# Patient Record
Sex: Female | Born: 1935 | Race: Black or African American | Hispanic: No | Smoking: Former smoker
Health system: Southern US, Community
[De-identification: ages and names within clinical notes are randomized; demographics above are authoritative.]

## PROBLEM LIST (undated history)

## (undated) ENCOUNTER — Emergency Department (HOSPITAL_COMMUNITY): Payer: Medicare Other | Source: Home / Self Care

## (undated) DIAGNOSIS — D696 Thrombocytopenia, unspecified: Secondary | ICD-10-CM

## (undated) DIAGNOSIS — E785 Hyperlipidemia, unspecified: Secondary | ICD-10-CM

## (undated) DIAGNOSIS — R51 Headache: Secondary | ICD-10-CM

## (undated) DIAGNOSIS — D638 Anemia in other chronic diseases classified elsewhere: Secondary | ICD-10-CM

## (undated) DIAGNOSIS — W102XXA Fall (on)(from) incline, initial encounter: Secondary | ICD-10-CM

## (undated) DIAGNOSIS — L8990 Pressure ulcer of unspecified site, unspecified stage: Secondary | ICD-10-CM

## (undated) DIAGNOSIS — M810 Age-related osteoporosis without current pathological fracture: Secondary | ICD-10-CM

## (undated) DIAGNOSIS — E46 Unspecified protein-calorie malnutrition: Secondary | ICD-10-CM

## (undated) DIAGNOSIS — I10 Essential (primary) hypertension: Secondary | ICD-10-CM

## (undated) DIAGNOSIS — E119 Type 2 diabetes mellitus without complications: Secondary | ICD-10-CM

## (undated) DIAGNOSIS — I639 Cerebral infarction, unspecified: Secondary | ICD-10-CM

## (undated) DIAGNOSIS — I7090 Unspecified atherosclerosis: Secondary | ICD-10-CM

## (undated) DIAGNOSIS — K219 Gastro-esophageal reflux disease without esophagitis: Secondary | ICD-10-CM

## (undated) DIAGNOSIS — R531 Weakness: Secondary | ICD-10-CM

## (undated) DIAGNOSIS — E870 Hyperosmolality and hypernatremia: Secondary | ICD-10-CM

## (undated) DIAGNOSIS — G309 Alzheimer's disease, unspecified: Secondary | ICD-10-CM

## (undated) DIAGNOSIS — M79601 Pain in right arm: Secondary | ICD-10-CM

## (undated) DIAGNOSIS — K5909 Other constipation: Secondary | ICD-10-CM

## (undated) DIAGNOSIS — N189 Chronic kidney disease, unspecified: Secondary | ICD-10-CM

## (undated) DIAGNOSIS — I739 Peripheral vascular disease, unspecified: Secondary | ICD-10-CM

## (undated) DIAGNOSIS — F0280 Dementia in other diseases classified elsewhere without behavioral disturbance: Secondary | ICD-10-CM

## (undated) DIAGNOSIS — N390 Urinary tract infection, site not specified: Secondary | ICD-10-CM

## (undated) DIAGNOSIS — I708 Atherosclerosis of other arteries: Secondary | ICD-10-CM

## (undated) DIAGNOSIS — Z9889 Other specified postprocedural states: Secondary | ICD-10-CM

## (undated) DIAGNOSIS — I2510 Atherosclerotic heart disease of native coronary artery without angina pectoris: Secondary | ICD-10-CM

## (undated) DIAGNOSIS — I441 Atrioventricular block, second degree: Secondary | ICD-10-CM

## (undated) DIAGNOSIS — I350 Nonrheumatic aortic (valve) stenosis: Secondary | ICD-10-CM

## (undated) DIAGNOSIS — L8994 Pressure ulcer of unspecified site, stage 4: Secondary | ICD-10-CM

## (undated) HISTORY — DX: Nonrheumatic aortic (valve) stenosis: I35.0

## (undated) HISTORY — DX: Unspecified atherosclerosis: I70.90

## (undated) HISTORY — DX: Dementia in other diseases classified elsewhere without behavioral disturbance: F02.80

## (undated) HISTORY — DX: Other constipation: K59.09

## (undated) HISTORY — DX: Urinary tract infection, site not specified: N39.0

## (undated) HISTORY — DX: Type 2 diabetes mellitus without complications: E11.9

## (undated) HISTORY — DX: Anemia in other chronic diseases classified elsewhere: D63.8

## (undated) HISTORY — DX: Headache: R51

## (undated) HISTORY — DX: Fall (on)(from) incline, initial encounter: W10.2XXA

## (undated) HISTORY — PX: OTHER SURGICAL HISTORY: SHX169

## (undated) HISTORY — DX: Alzheimer's disease, unspecified: G30.9

## (undated) HISTORY — DX: Essential (primary) hypertension: I10

## (undated) HISTORY — DX: Peripheral vascular disease, unspecified: I73.9

## (undated) HISTORY — DX: Hyperosmolality and hypernatremia: E87.0

## (undated) HISTORY — DX: Atherosclerosis of other arteries: I70.8

## (undated) HISTORY — DX: Hyperlipidemia, unspecified: E78.5

## (undated) HISTORY — DX: Atherosclerotic heart disease of native coronary artery without angina pectoris: I25.10

## (undated) HISTORY — DX: Weakness: R53.1

## (undated) HISTORY — DX: Cerebral infarction, unspecified: I63.9

## (undated) HISTORY — DX: Unspecified protein-calorie malnutrition: E46

## (undated) HISTORY — DX: Pressure ulcer of unspecified site, unspecified stage: L89.90

## (undated) HISTORY — DX: Pressure ulcer of unspecified site, stage 4: L89.94

## (undated) HISTORY — DX: Thrombocytopenia, unspecified: D69.6

## (undated) HISTORY — DX: Pain in right arm: M79.601

## (undated) HISTORY — DX: Age-related osteoporosis without current pathological fracture: M81.0

## (undated) HISTORY — DX: Other specified postprocedural states: Z98.890

## (undated) HISTORY — DX: Chronic kidney disease, unspecified: N18.9

## (undated) HISTORY — DX: Atrioventricular block, second degree: I44.1

## (undated) HISTORY — DX: Gastro-esophageal reflux disease without esophagitis: K21.9

---

## 2000-02-02 ENCOUNTER — Emergency Department (HOSPITAL_COMMUNITY): Admission: EM | Admit: 2000-02-02 | Discharge: 2000-02-02 | Payer: Self-pay

## 2000-08-21 ENCOUNTER — Emergency Department (HOSPITAL_COMMUNITY): Admission: EM | Admit: 2000-08-21 | Discharge: 2000-08-22 | Payer: Self-pay | Admitting: *Deleted

## 2000-09-01 ENCOUNTER — Encounter (HOSPITAL_BASED_OUTPATIENT_CLINIC_OR_DEPARTMENT_OTHER): Payer: Self-pay | Admitting: Internal Medicine

## 2000-09-01 ENCOUNTER — Ambulatory Visit (HOSPITAL_COMMUNITY): Admission: RE | Admit: 2000-09-01 | Discharge: 2000-09-01 | Payer: Self-pay | Admitting: Internal Medicine

## 2000-09-10 ENCOUNTER — Encounter: Admission: RE | Admit: 2000-09-10 | Discharge: 2000-09-10 | Payer: Self-pay | Admitting: Gastroenterology

## 2000-09-10 ENCOUNTER — Encounter: Payer: Self-pay | Admitting: Gastroenterology

## 2001-08-31 DIAGNOSIS — R519 Headache, unspecified: Secondary | ICD-10-CM

## 2001-08-31 HISTORY — DX: Headache, unspecified: R51.9

## 2001-10-15 ENCOUNTER — Encounter: Payer: Self-pay | Admitting: Emergency Medicine

## 2001-10-15 ENCOUNTER — Inpatient Hospital Stay (HOSPITAL_COMMUNITY): Admission: EM | Admit: 2001-10-15 | Discharge: 2001-10-20 | Payer: Self-pay | Admitting: Emergency Medicine

## 2001-11-02 ENCOUNTER — Encounter: Admission: RE | Admit: 2001-11-02 | Discharge: 2002-01-31 | Payer: Self-pay | Admitting: Internal Medicine

## 2003-03-13 ENCOUNTER — Ambulatory Visit (HOSPITAL_COMMUNITY): Admission: RE | Admit: 2003-03-13 | Discharge: 2003-03-13 | Payer: Self-pay | Admitting: Gastroenterology

## 2003-03-28 ENCOUNTER — Emergency Department (HOSPITAL_COMMUNITY): Admission: EM | Admit: 2003-03-28 | Discharge: 2003-03-28 | Payer: Self-pay | Admitting: Emergency Medicine

## 2003-04-05 ENCOUNTER — Encounter (INDEPENDENT_AMBULATORY_CARE_PROVIDER_SITE_OTHER): Payer: Self-pay | Admitting: Specialist

## 2003-04-05 ENCOUNTER — Ambulatory Visit (HOSPITAL_BASED_OUTPATIENT_CLINIC_OR_DEPARTMENT_OTHER): Admission: RE | Admit: 2003-04-05 | Discharge: 2003-04-05 | Payer: Self-pay | Admitting: General Surgery

## 2003-04-13 ENCOUNTER — Ambulatory Visit (HOSPITAL_COMMUNITY): Admission: RE | Admit: 2003-04-13 | Discharge: 2003-04-13 | Payer: Self-pay | Admitting: Internal Medicine

## 2003-04-13 ENCOUNTER — Encounter: Payer: Self-pay | Admitting: Internal Medicine

## 2004-04-03 ENCOUNTER — Inpatient Hospital Stay (HOSPITAL_BASED_OUTPATIENT_CLINIC_OR_DEPARTMENT_OTHER): Admission: RE | Admit: 2004-04-03 | Discharge: 2004-04-03 | Payer: Self-pay | Admitting: Cardiology

## 2004-04-08 ENCOUNTER — Ambulatory Visit (HOSPITAL_COMMUNITY): Admission: RE | Admit: 2004-04-08 | Discharge: 2004-04-09 | Payer: Self-pay | Admitting: Cardiology

## 2005-03-04 ENCOUNTER — Emergency Department (HOSPITAL_COMMUNITY): Admission: EM | Admit: 2005-03-04 | Discharge: 2005-03-04 | Payer: Self-pay | Admitting: Emergency Medicine

## 2005-04-20 ENCOUNTER — Ambulatory Visit: Payer: Self-pay | Admitting: Cardiology

## 2005-05-07 ENCOUNTER — Ambulatory Visit: Payer: Self-pay

## 2005-06-25 ENCOUNTER — Encounter: Admission: RE | Admit: 2005-06-25 | Discharge: 2005-06-25 | Payer: Self-pay | Admitting: Orthopedic Surgery

## 2005-08-11 ENCOUNTER — Ambulatory Visit: Payer: Self-pay | Admitting: Cardiology

## 2005-08-28 ENCOUNTER — Ambulatory Visit: Payer: Self-pay

## 2005-09-16 ENCOUNTER — Ambulatory Visit: Payer: Self-pay | Admitting: Physical Medicine & Rehabilitation

## 2005-09-16 ENCOUNTER — Ambulatory Visit: Payer: Self-pay | Admitting: Infectious Diseases

## 2005-09-16 ENCOUNTER — Inpatient Hospital Stay (HOSPITAL_COMMUNITY): Admission: RE | Admit: 2005-09-16 | Discharge: 2005-09-21 | Payer: Self-pay | Admitting: Orthopedic Surgery

## 2005-09-16 ENCOUNTER — Encounter (INDEPENDENT_AMBULATORY_CARE_PROVIDER_SITE_OTHER): Payer: Self-pay | Admitting: Specialist

## 2005-09-21 ENCOUNTER — Inpatient Hospital Stay
Admission: RE | Admit: 2005-09-21 | Discharge: 2005-10-03 | Payer: Self-pay | Admitting: Physical Medicine & Rehabilitation

## 2005-10-05 ENCOUNTER — Ambulatory Visit (HOSPITAL_COMMUNITY): Admission: RE | Admit: 2005-10-05 | Discharge: 2005-10-05 | Payer: Self-pay | Admitting: Orthopedic Surgery

## 2005-11-11 ENCOUNTER — Inpatient Hospital Stay (HOSPITAL_COMMUNITY): Admission: RE | Admit: 2005-11-11 | Discharge: 2005-11-16 | Payer: Self-pay | Admitting: Orthopedic Surgery

## 2005-11-11 ENCOUNTER — Encounter (INDEPENDENT_AMBULATORY_CARE_PROVIDER_SITE_OTHER): Payer: Self-pay | Admitting: *Deleted

## 2005-11-11 ENCOUNTER — Ambulatory Visit: Payer: Self-pay | Admitting: Physical Medicine & Rehabilitation

## 2006-01-29 ENCOUNTER — Ambulatory Visit: Payer: Self-pay | Admitting: Cardiology

## 2006-02-05 ENCOUNTER — Ambulatory Visit: Payer: Self-pay | Admitting: Cardiology

## 2006-08-06 ENCOUNTER — Ambulatory Visit: Payer: Self-pay | Admitting: Cardiology

## 2006-08-20 ENCOUNTER — Ambulatory Visit: Payer: Self-pay

## 2006-08-20 ENCOUNTER — Ambulatory Visit: Payer: Self-pay | Admitting: Cardiology

## 2006-08-20 ENCOUNTER — Encounter: Payer: Self-pay | Admitting: Cardiology

## 2007-01-31 ENCOUNTER — Ambulatory Visit: Payer: Self-pay | Admitting: Cardiology

## 2007-03-28 ENCOUNTER — Ambulatory Visit: Payer: Self-pay | Admitting: Cardiology

## 2007-03-28 LAB — CONVERTED CEMR LAB
ALT: 19 units/L (ref 0–35)
AST: 26 units/L (ref 0–37)
Albumin: 3.5 g/dL (ref 3.5–5.2)
Alkaline Phosphatase: 72 units/L (ref 39–117)
Bilirubin, Direct: 0.1 mg/dL (ref 0.0–0.3)
Cholesterol: 354 mg/dL (ref 0–200)
HDL: 50.9 mg/dL (ref >39.0)
Total Bilirubin: 1.3 mg/dL — ABNORMAL HIGH (ref 0.3–1.2)
Total CHOL/HDL Ratio: 7
Total Protein: 7.2 g/dL (ref 6.0–8.3)

## 2007-08-05 ENCOUNTER — Ambulatory Visit: Payer: Self-pay | Admitting: Cardiology

## 2007-08-05 LAB — CONVERTED CEMR LAB
ALT: 19 units/L (ref 0–35)
AST: 27 units/L (ref 0–37)
Albumin: 3.6 g/dL (ref 3.5–5.2)
Alkaline Phosphatase: 70 units/L (ref 39–117)
Bilirubin, Direct: 0.1 mg/dL (ref 0.0–0.3)
Total Protein: 7.3 g/dL (ref 6.0–8.3)
Triglycerides: 76 mg/dL (ref 0–149)

## 2007-08-15 ENCOUNTER — Ambulatory Visit: Payer: Self-pay | Admitting: Cardiology

## 2007-11-04 ENCOUNTER — Ambulatory Visit: Payer: Self-pay | Admitting: Cardiology

## 2007-11-04 LAB — CONVERTED CEMR LAB
ALT: 16 units/L (ref 0–35)
AST: 24 units/L (ref 0–37)
Direct LDL: 130 mg/dL
HDL: 65.7 mg/dL (ref >39.0)
Total Bilirubin: 0.8 mg/dL (ref 0.3–1.2)
Total Protein: 6.7 g/dL (ref 6.0–8.3)

## 2008-02-02 ENCOUNTER — Ambulatory Visit: Payer: Self-pay | Admitting: Cardiology

## 2008-02-13 ENCOUNTER — Ambulatory Visit: Payer: Self-pay

## 2008-02-13 ENCOUNTER — Encounter: Payer: Self-pay | Admitting: Cardiology

## 2008-07-31 ENCOUNTER — Ambulatory Visit: Payer: Self-pay | Admitting: Cardiovascular Disease

## 2008-08-09 ENCOUNTER — Ambulatory Visit: Payer: Self-pay

## 2009-02-11 ENCOUNTER — Ambulatory Visit: Payer: Self-pay | Admitting: Cardiovascular Disease

## 2009-02-11 DIAGNOSIS — I350 Nonrheumatic aortic (valve) stenosis: Secondary | ICD-10-CM

## 2009-02-11 DIAGNOSIS — I10 Essential (primary) hypertension: Secondary | ICD-10-CM

## 2009-02-11 DIAGNOSIS — I2510 Atherosclerotic heart disease of native coronary artery without angina pectoris: Secondary | ICD-10-CM | POA: Insufficient documentation

## 2009-02-20 ENCOUNTER — Ambulatory Visit: Payer: Self-pay

## 2009-02-20 ENCOUNTER — Encounter: Payer: Self-pay | Admitting: Cardiovascular Disease

## 2009-03-11 ENCOUNTER — Telehealth: Payer: Self-pay | Admitting: Cardiovascular Disease

## 2009-07-08 ENCOUNTER — Ambulatory Visit: Payer: Self-pay | Admitting: Cardiology

## 2009-07-08 ENCOUNTER — Inpatient Hospital Stay (HOSPITAL_COMMUNITY): Admission: EM | Admit: 2009-07-08 | Discharge: 2009-07-13 | Payer: Self-pay | Admitting: Emergency Medicine

## 2009-07-09 ENCOUNTER — Encounter: Payer: Self-pay | Admitting: Cardiovascular Disease

## 2009-07-30 ENCOUNTER — Encounter: Payer: Self-pay | Admitting: Nurse Practitioner

## 2009-07-30 ENCOUNTER — Encounter: Payer: Self-pay | Admitting: Cardiovascular Disease

## 2009-07-30 ENCOUNTER — Ambulatory Visit: Payer: Self-pay | Admitting: Cardiology

## 2009-07-30 DIAGNOSIS — I441 Atrioventricular block, second degree: Secondary | ICD-10-CM | POA: Insufficient documentation

## 2009-07-30 DIAGNOSIS — E119 Type 2 diabetes mellitus without complications: Secondary | ICD-10-CM

## 2009-07-30 DIAGNOSIS — S46819A Strain of other muscles, fascia and tendons at shoulder and upper arm level, unspecified arm, initial encounter: Secondary | ICD-10-CM

## 2009-07-30 DIAGNOSIS — S43499A Other sprain of unspecified shoulder joint, initial encounter: Secondary | ICD-10-CM

## 2009-07-30 DIAGNOSIS — I080 Rheumatic disorders of both mitral and aortic valves: Secondary | ICD-10-CM

## 2009-07-30 DIAGNOSIS — E7800 Pure hypercholesterolemia, unspecified: Secondary | ICD-10-CM | POA: Insufficient documentation

## 2009-07-31 ENCOUNTER — Encounter: Payer: Self-pay | Admitting: Internal Medicine

## 2009-07-31 ENCOUNTER — Ambulatory Visit (HOSPITAL_COMMUNITY): Admission: RE | Admit: 2009-07-31 | Discharge: 2009-07-31 | Payer: Self-pay | Admitting: Internal Medicine

## 2009-08-06 ENCOUNTER — Ambulatory Visit: Payer: Self-pay | Admitting: Cardiovascular Disease

## 2009-08-06 LAB — CONVERTED CEMR LAB
BUN: 18 mg/dL (ref 6–23)
CO2: 33 meq/L — ABNORMAL HIGH (ref 19–32)
Calcium: 10.6 mg/dL — ABNORMAL HIGH (ref 8.4–10.5)
GFR calc non Af Amer: 50.99 mL/min (ref >60)

## 2009-08-27 ENCOUNTER — Encounter (INDEPENDENT_AMBULATORY_CARE_PROVIDER_SITE_OTHER): Payer: Self-pay | Admitting: *Deleted

## 2009-09-12 LAB — CONVERTED CEMR LAB
CO2: 30 meq/L (ref 19–32)
Chloride: 110 meq/L (ref 96–112)
Creatinine, Ser: 1.2 mg/dL (ref 0.4–1.2)
Glucose, Bld: 107 mg/dL — ABNORMAL HIGH (ref 70–99)
Sodium: 145 meq/L (ref 135–145)

## 2009-09-16 ENCOUNTER — Ambulatory Visit: Payer: Self-pay | Admitting: Cardiovascular Disease

## 2009-09-17 LAB — CONVERTED CEMR LAB
BUN: 21 mg/dL (ref 6–23)
Chloride: 107 meq/L (ref 96–112)
Creatinine, Ser: 1.3 mg/dL — ABNORMAL HIGH (ref 0.4–1.2)
Glucose, Bld: 113 mg/dL — ABNORMAL HIGH (ref 70–99)
Potassium: 3.8 meq/L (ref 3.5–5.1)

## 2009-09-18 DIAGNOSIS — D649 Anemia, unspecified: Secondary | ICD-10-CM

## 2009-09-19 ENCOUNTER — Ambulatory Visit: Payer: Self-pay | Admitting: Gastroenterology

## 2009-09-19 ENCOUNTER — Encounter (INDEPENDENT_AMBULATORY_CARE_PROVIDER_SITE_OTHER): Payer: Self-pay | Admitting: *Deleted

## 2009-09-19 DIAGNOSIS — K5900 Constipation, unspecified: Secondary | ICD-10-CM | POA: Insufficient documentation

## 2009-09-19 DIAGNOSIS — R630 Anorexia: Secondary | ICD-10-CM

## 2009-09-19 DIAGNOSIS — M129 Arthropathy, unspecified: Secondary | ICD-10-CM | POA: Insufficient documentation

## 2009-09-19 DIAGNOSIS — R079 Chest pain, unspecified: Secondary | ICD-10-CM

## 2009-09-19 LAB — CONVERTED CEMR LAB
Basophils Relative: 0.6 % (ref 0.0–3.0)
Eosinophils Absolute: 0.1 10*3/uL (ref 0.0–0.7)
Eosinophils Relative: 1.4 % (ref 0.0–5.0)
HCT: 34 % — ABNORMAL LOW (ref 36.0–46.0)
Hemoglobin: 10.9 g/dL — ABNORMAL LOW (ref 12.0–15.0)
Lymphs Abs: 2.6 10*3/uL (ref 0.7–4.0)
Monocytes Relative: 7 % (ref 3.0–12.0)
Neutrophils Relative %: 37.7 % — ABNORMAL LOW (ref 43.0–77.0)
RBC: 4.21 M/uL (ref 3.87–5.11)
Saturation Ratios: 21.8 % (ref 20.0–50.0)
Transferrin: 202.9 mg/dL — ABNORMAL LOW (ref 212.0–360.0)

## 2009-09-25 ENCOUNTER — Telehealth: Payer: Self-pay | Admitting: Gastroenterology

## 2009-10-01 ENCOUNTER — Telehealth: Payer: Self-pay | Admitting: Gastroenterology

## 2009-10-02 ENCOUNTER — Ambulatory Visit: Payer: Self-pay | Admitting: Gastroenterology

## 2009-10-07 ENCOUNTER — Encounter: Payer: Self-pay | Admitting: Cardiovascular Disease

## 2009-10-07 DIAGNOSIS — I2510 Atherosclerotic heart disease of native coronary artery without angina pectoris: Secondary | ICD-10-CM | POA: Insufficient documentation

## 2009-11-11 ENCOUNTER — Ambulatory Visit: Payer: Self-pay | Admitting: Cardiovascular Disease

## 2009-12-09 ENCOUNTER — Telehealth: Payer: Self-pay | Admitting: Cardiovascular Disease

## 2009-12-12 ENCOUNTER — Encounter: Payer: Self-pay | Admitting: Cardiology

## 2009-12-12 ENCOUNTER — Telehealth: Payer: Self-pay | Admitting: Cardiovascular Disease

## 2009-12-12 ENCOUNTER — Ambulatory Visit: Payer: Self-pay | Admitting: Cardiovascular Disease

## 2009-12-12 ENCOUNTER — Inpatient Hospital Stay (HOSPITAL_COMMUNITY): Admission: EM | Admit: 2009-12-12 | Discharge: 2009-12-19 | Payer: Self-pay | Admitting: Emergency Medicine

## 2009-12-12 ENCOUNTER — Ambulatory Visit: Payer: Self-pay | Admitting: Cardiology

## 2009-12-12 ENCOUNTER — Ambulatory Visit: Payer: Self-pay

## 2009-12-13 LAB — CONVERTED CEMR LAB
BUN: 64 mg/dL — ABNORMAL HIGH (ref 6–23)
Basophils Relative: 0.3 % (ref 0.0–3.0)
CO2: 32 meq/L (ref 19–32)
Creatinine, Ser: 5.6 mg/dL (ref 0.4–1.2)
Eosinophils Absolute: 0 10*3/uL (ref 0.0–0.7)
Eosinophils Relative: 0.4 % (ref 0.0–5.0)
GFR calc non Af Amer: 9.44 mL/min (ref >60)
HCT: 33.4 % — ABNORMAL LOW (ref 36.0–46.0)
Hemoglobin: 11.1 g/dL — ABNORMAL LOW (ref 12.0–15.0)
Monocytes Absolute: 0.4 10*3/uL (ref 0.1–1.0)
Neutro Abs: 4.1 10*3/uL (ref 1.4–7.7)
Platelets: 215 10*3/uL (ref 150.0–400.0)
RBC: 4.37 M/uL (ref 3.87–5.11)
WBC: 6.3 10*3/uL (ref 4.5–10.5)

## 2009-12-24 ENCOUNTER — Encounter: Payer: Self-pay | Admitting: Cardiovascular Disease

## 2009-12-27 ENCOUNTER — Ambulatory Visit: Payer: Self-pay

## 2010-01-01 ENCOUNTER — Telehealth: Payer: Self-pay | Admitting: Cardiology

## 2010-01-01 ENCOUNTER — Telehealth: Payer: Self-pay | Admitting: Cardiovascular Disease

## 2010-01-06 ENCOUNTER — Ambulatory Visit: Payer: Self-pay | Admitting: Internal Medicine

## 2010-01-07 LAB — CONVERTED CEMR LAB
Basophils Relative: 0.9 % (ref 0.0–3.0)
CO2: 27 meq/L (ref 19–32)
Calcium: 10.3 mg/dL (ref 8.4–10.5)
Eosinophils Relative: 0.9 % (ref 0.0–5.0)
GFR calc non Af Amer: 39.51 mL/min (ref >60)
HCT: 25.4 % — ABNORMAL LOW (ref 36.0–46.0)
INR: 1.1 — ABNORMAL HIGH (ref 0.8–1.0)
Lymphocytes Relative: 42.5 % (ref 12.0–46.0)
Monocytes Absolute: 0.2 10*3/uL (ref 0.1–1.0)
Neutrophils Relative %: 50.8 % (ref 43.0–77.0)
Potassium: 3.6 meq/L (ref 3.5–5.1)
WBC: 3.8 10*3/uL — ABNORMAL LOW (ref 4.5–10.5)
aPTT: 29.8 s — ABNORMAL HIGH (ref 21.7–28.8)

## 2010-01-09 ENCOUNTER — Encounter: Payer: Self-pay | Admitting: Cardiovascular Disease

## 2010-01-10 ENCOUNTER — Ambulatory Visit: Payer: Self-pay | Admitting: Internal Medicine

## 2010-01-10 ENCOUNTER — Ambulatory Visit (HOSPITAL_COMMUNITY): Admission: RE | Admit: 2010-01-10 | Discharge: 2010-01-11 | Payer: Self-pay | Admitting: Internal Medicine

## 2010-01-10 HISTORY — PX: PACEMAKER INSERTION: SHX728

## 2010-01-13 ENCOUNTER — Encounter: Payer: Self-pay | Admitting: Internal Medicine

## 2010-01-20 ENCOUNTER — Ambulatory Visit: Payer: Self-pay

## 2010-01-20 ENCOUNTER — Encounter: Payer: Self-pay | Admitting: Internal Medicine

## 2010-02-27 ENCOUNTER — Telehealth: Payer: Self-pay | Admitting: Internal Medicine

## 2010-05-02 ENCOUNTER — Ambulatory Visit: Payer: Self-pay | Admitting: Internal Medicine

## 2010-05-13 ENCOUNTER — Ambulatory Visit: Payer: Self-pay | Admitting: Cardiovascular Disease

## 2010-06-19 ENCOUNTER — Encounter: Payer: Self-pay | Admitting: Cardiovascular Disease

## 2010-06-20 ENCOUNTER — Ambulatory Visit: Payer: Self-pay | Admitting: Cardiovascular Disease

## 2010-06-20 ENCOUNTER — Ambulatory Visit (HOSPITAL_COMMUNITY): Admission: RE | Admit: 2010-06-20 | Discharge: 2010-06-20 | Payer: Self-pay | Admitting: Cardiovascular Disease

## 2010-06-20 ENCOUNTER — Encounter: Payer: Self-pay | Admitting: Cardiovascular Disease

## 2010-08-18 ENCOUNTER — Encounter
Admission: RE | Admit: 2010-08-18 | Discharge: 2010-08-18 | Payer: Self-pay | Source: Home / Self Care | Attending: Nephrology | Admitting: Nephrology

## 2010-09-30 NOTE — Cardiovascular Report (Signed)
Summary: [facility]   [facility]   Imported By: Roderic Ovens 01/31/2010 15:57:39  _____________________________________________________________________  External Attachment:    Type:   Image     Comment:   External Document

## 2010-09-30 NOTE — Miscellaneous (Signed)
Summary: Orders Update  Clinical Lists Changes  Problems: Added new problem of CAROTID ARTERY DISEASE (ICD-433.10) Orders: Added new Test order of Carotid Duplex (Carotid Duplex) - Signed 

## 2010-09-30 NOTE — Procedures (Signed)
Summary: Colonoscopy  Patient: [name] Note: All result statuses are Final unless otherwise noted.  Tests: (1) Colonoscopy (COL)   COL Colonoscopy           DONE     [facility]     [address]     [place], [place]  [address]           COLONOSCOPY PROCEDURE REPORT           PATIENT:  [name], [name]  MR#:  [id number]     BIRTHDATE:  [birth year], 79 yrs. old  GENDER:  female           ENDOSCOPIST:  Vania Rea. Jarold Motto, MD, [facility]     Referred by:           PROCEDURE DATE:  10/02/2009     PROCEDURE:  Colonoscopy, Diagnostic     ASA CLASS:  Class III     INDICATIONS:  Iron Deficiency Anemia           MEDICATIONS:   Fentanyl 50 mcg IV, Versed 5 mg IV           DESCRIPTION OF PROCEDURE:   After the risks benefits and     alternatives of the procedure were thoroughly explained, informed     consent was obtained.  Digital rectal exam was performed and     revealed no abnormalities.   The LB PCF-H180AL [id number] endoscope     was introduced through the anus and advanced to the cecum, which     was identified by both the appendix and ileocecal valve, limited     by a redundant colon, poor preparation.    The quality of the prep     was adequate, using MoviPrep.  The instrument was then slowly     withdrawn as the colon was fully examined.     <<PROCEDUREIMAGES>>           FINDINGS:  Moderate diverticulosis was found sigmoid to descending     No polyps or cancers were seen.  This was otherwise a normal     examination of the colon.   Retroflexed views in the rectum     revealed no abnormalities.    The scope was then withdrawn from     the patient and the procedure completed.           COMPLICATIONS:  None           ENDOSCOPIC IMPRESSION:     1) Moderate diverticulosis in the sigmoid to descending     2) No polyps or cancers     3) Otherwise normal examination     RECOMMENDATIONS:     1) high fiber diet     2) Upper endoscopy will be scheduled           REPEAT  EXAM:  No           ______________________________     Vania Rea. Jarold Motto, MD, [facility]           CC:  Jarome Matin, MD           n.     Rosalie Doctor:   Vania Rea. Patterson at 10/02/2009 10:51 AM           [place], [place], [id number]  Note: An exclamation mark (!) indicates a result that was not dispersed into the flowsheet. Document Creation Date: 10/02/2009 10:52 AM _______________________________________________________________________  (1) Order result status: Final Collection or observation date-time: 10/02/2009 10:47  Requested date-time:  Receipt date-time:  Reported date-time:  Referring Physician:   Ordering Physician: Sheryn Bison [phone]) Specimen Source:  Source: Launa Grill Order Number: [id number] Lab site:

## 2010-09-30 NOTE — Miscellaneous (Signed)
Summary: Orders Update  Clinical Lists Changes  Orders: Added new Referral order of Event (Event) - Signed

## 2010-09-30 NOTE — Miscellaneous (Signed)
Summary: Orders Update  Clinical Lists Changes  Orders: Added new Test order of Carotid Duplex (Carotid Duplex) - Signed 

## 2010-09-30 NOTE — Progress Notes (Signed)
Summary: Question about Prep for Procedure  Phone Note Call from Patient Call back at Home Phone [phone]   Caller: Patient Call For: Dr. Jarold Motto Reason for Call: Talk to Nurse Summary of Call: Pt. has some questions about her prep for her procedure tomorrow Initial call taken by: Karna Christmas,  October 01, 2009 10:26 AM  Follow-up for Phone Call        Went over movi prep instructions agian with pt.  States she understands instructions. Follow-up by: Ashok Cordia RN,  October 01, 2009 10:31 AM

## 2010-09-30 NOTE — Procedures (Signed)
Summary: Upper Endoscopy  Patient: [name] Note: All result statuses are Final unless otherwise noted.  Tests: (1) Upper Endoscopy (EGD)   EGD Upper Endoscopy       DONE     [facility]     [address]     [place], [place]  [address]           ENDOSCOPY PROCEDURE REPORT           PATIENT:  [name], [name]  MR#:  [id number]     BIRTHDATE:  12/19/1929, 79 yrs. old  GENDER:  female           ENDOSCOPIST:  Vania Rea. Jarold Motto, MD, [facility]     Referred by:           PROCEDURE DATE:  10/02/2009     PROCEDURE:  EGD, diagnostic     ASA CLASS:  Class III     INDICATIONS:  iron deficiency anemia           MEDICATIONS:   There was residual sedation effect present from     prior procedure., Versed 2 mg IV, Fentanyl 25 mg     TOPICAL ANESTHETIC:           DESCRIPTION OF PROCEDURE:   After the risks benefits and     alternatives of the procedure were thoroughly explained, informed     consent was obtained.  The LB GIF-H180 [id number] endoscope was     introduced through the mouth and advanced to the second portion of     the duodenum, limited by retching and gagging.   The instrument     was slowly withdrawn as the mucosa was fully examined.     <<PROCEDUREIMAGES>>           Normal duodenal folds were noted.  The esophagus and     gastroesophageal junction were completely normal in appearance.     The stomach was entered and closely examined. The antrum,     angularis, and lesser curvature were well visualized, including a     retroflexed view of the cardia and fundus. The stomach wall was     normally distensable. The scope passed easily through the pylorus     into the duodenum.    Retroflexed views revealed no masses.    The     scope was then withdrawn from the patient and the procedure     completed.           COMPLICATIONS:  None           ENDOSCOPIC IMPRESSION:     1) Normal duodenal folds     2) Normal esophagus     3) Normal stomach     4) No masses     NO  CAUSE FOR ANEMIA.     RECOMMENDATIONS:     1) continue current medications     RESUME PLAVIX RX.           REPEAT EXAM:  No           ______________________________     Vania Rea. Jarold Motto, MD, [facility]           CC:  Jarome Matin, MD           n.     Rosalie Doctor:   Vania Rea. Patterson at 10/02/2009 11:00 AM           [place], Gotwalt [place], [id number]  Note: An exclamation mark (!) indicates a result that  was not dispersed into the flowsheet. Document Creation Date: 10/02/2009 11:01 AM _______________________________________________________________________  (1) Order result status: Final Collection or observation date-time: 10/02/2009 10:55 Requested date-time:  Receipt date-time:  Reported date-time:  Referring Physician:   Ordering Physician: Sheryn Bison [phone]) Specimen Source:  Source: Launa Grill Order Number: [id number] Lab site:

## 2010-09-30 NOTE — Progress Notes (Signed)
Summary: Life watch monitor  Phone Note Outgoing Call   Call placed to: Patient Summary of Call: Received several strips from Life watch showing second degree heart block.  Strips reviewed by Dr.McAlhany. I called pt this AM and she reports she is feeling good. No syncopal episodes. I told pt to call us if she begins having any symptoms. She will continue to wear monitor and has appt. scheduled with Dr. Clifton James on Jan 21, 2010. Initial call taken by: Dossie Arbour, RN, BSN,  Jan 01, 2010 10:10 AM

## 2010-09-30 NOTE — Assessment & Plan Note (Signed)
Summary: rov/ appt is 9:30/ gd   Visit Type:  Follow-up Referring Provider:  Jarome Matin, MD Primary Provider:  Jarome Matin, MD  CC:  no cardiac complaints today.  History of Present Illness: [name] is a pleasant 77 year old African American female with a past medical history significant for coronary artery disease, status post placement of stents in the circumflex and right coronary arteries in 2005 by Dr. Juanda Chance, as well as hypertension, mild AS, hyperlipidemia, obesity, and diabetes mellitus, who comes in today for routine cardiac followup. She has been doing well. Most recent carotid dopplers showed mild disease bilaterally (0-39% RICA, LICA). She was admitted to [facility] in November with pre-renal azotemia with hypotension, bradycardia and syncope and had an echo with normal LV function and no wall motion abnormalities. Her AS has been mild.  She was seen here in our office for hospital follow up in November by Ward Givens, NP. She has been doing well. She has had no chest pain, SOB, palpitatoins, near syncope or syncope. She has been taking all of her medications without problems.   Current Medications (verified): 1)  Nitroglycerin 0.4 Mg Subl (Nitroglycerin) .... Place 1 Tablet Under Tongue As Directed 2)  Plavix 75 Mg Tabs (Clopidogrel Bisulfate) .... Take 1 Tablet By Mouth Once A Day 3)  Metformin Hcl 500 Mg Xr24h-Tab (Metformin Hcl) .Marland Kitchen.. 1 Tab At Bedtime 4)  Amlodipine Besylate 5 Mg Tabs (Amlodipine Besylate) .... Take One Tablet By Mouth Daily 5)  Aspirin 81 Mg Tbec (Aspirin) .... Take One Tablet By Mouth Daily 6)  Crestor 40 Mg Tabs (Rosuvastatin Calcium) .... Take One Tablet By Mouth Daily. 7)  Potassium Chloride Crys Cr 20 Meq Cr-Tabs (Potassium Chloride Crys Cr) .... Take Two Tablets By Mouth Daily 8)  Atenolol 50 Mg Tabs (Atenolol) .... One Tablet By Mouth Two Times A Day 9)  Hydrochlorothiazide 25 Mg Tabs (Hydrochlorothiazide) .... One Tablet By Mouth  Once Daily  Allergies: 1)  ! * Latex  Past History:  Past Medical History: Reviewed history from 07/30/2009 and no changes required. Diabetes Mellitus Hyperlipidemia Hypertension CAD-s/p NSTEMI 2003 with stent placement OM1, PTCA with cutting ballloon proximal LAD. 2005 admission with chest pain, Bare metal stent placed in Circumflex and Taxus stent placed in RCA. Mild Aortic stenosis Mild MR Grade I Diastolic Relaxation Abnormality  Social History: Reviewed history from 07/31/2008 and no changes required. Remote tobacco use-smoked 1/2ppd for 30 years but stopped smoking 20 years ago. No alcohol No illicit drug use Widowed 8 children  Review of Systems  The patient denies fatigue, malaise, fever, weight gain/loss, vision loss, decreased hearing, hoarseness, chest pain, palpitations, shortness of breath, prolonged cough, wheezing, sleep apnea, coughing up blood, abdominal pain, blood in stool, nausea, vomiting, diarrhea, heartburn, incontinence, blood in urine, muscle weakness, joint pain, leg swelling, rash, skin lesions, headache, fainting, dizziness, depression, anxiety, enlarged lymph nodes, easy bruising or bleeding, and environmental allergies.    Vital Signs:  Patient profile:   77 year old female Height:      68 inches Weight:      213 pounds BMI:     32.50 Pulse rate:   64 / minute Pulse rhythm:   regular BP sitting:   130 / 70  (left arm) Cuff size:   large  Vitals Entered By: Danielle Rankin, CMA (November 11, 2009 9:25 AM)  Physical Exam  General:  General: Well developed, well nourished, NAD HEENT: OP clear, mucus membranes moist Psychiatric: Mood and affect normal  Neck: No JVD, no carotid bruits, no thyromegaly, no lymphadenopathy. Lungs:Clear bilaterally, no wheezes, rhonci, crackles CV: RRR with I/VI systolic  murmur at the RUSB. No  gallops rubs Abdomen: soft, NT, ND, BS present Extremities: No edema, pulses 2+.    Impression &  Recommendations:  Problem # 1:  CAD, NATIVE VESSEL (ICD-414.01) Stable. No chest pain. Continue current therapy.   Her updated medication list for this problem includes:    Nitroglycerin 0.4 Mg Subl (Nitroglycerin) .Marland Kitchen... Place 1 tablet under tongue as directed    Plavix 75 Mg Tabs (Clopidogrel bisulfate) .Marland Kitchen... Take 1 tablet by mouth once a day    Amlodipine Besylate 5 Mg Tabs (Amlodipine besylate) .Marland Kitchen... Take one tablet by mouth daily    Aspirin 81 Mg Tbec (Aspirin) .Marland Kitchen... Take one tablet by mouth daily    Atenolol 50 Mg Tabs (Atenolol) ..... One tablet by mouth two times a day  Problem # 2:  AORTIC STENOSIS (ICD-424.1) Asymptomatic. Mild by echo 11/10. Will repeat echo in 6 months.   Her updated medication list for this problem includes:    Nitroglycerin 0.4 Mg Subl (Nitroglycerin) .Marland Kitchen... Place 1 tablet under tongue as directed    Atenolol 50 Mg Tabs (Atenolol) ..... One tablet by mouth two times a day    Hydrochlorothiazide 25 Mg Tabs (Hydrochlorothiazide) ..... One tablet by mouth once daily  Problem # 3:  ESSENTIAL HYPERTENSION, BENIGN (ICD-401.1) Well controlled on current therapy.   Her updated medication list for this problem includes:    Amlodipine Besylate 5 Mg Tabs (Amlodipine besylate) .Marland Kitchen... Take one tablet by mouth daily    Aspirin 81 Mg Tbec (Aspirin) .Marland Kitchen... Take one tablet by mouth daily    Atenolol 50 Mg Tabs (Atenolol) ..... One tablet by mouth two times a day    Hydrochlorothiazide 25 Mg Tabs (Hydrochlorothiazide) ..... One tablet by mouth once daily  Problem # 4:  CAROTID ARTERY DISEASE (ICD-433.10) Repeat dopplers 6 months.   Her updated medication list for this problem includes:    Plavix 75 Mg Tabs (Clopidogrel bisulfate) .Marland Kitchen... Take 1 tablet by mouth once a day    Aspirin 81 Mg Tbec (Aspirin) .Marland Kitchen... Take one tablet by mouth daily  Patient Instructions: 1)  Your physician recommends that you schedule a follow-up appointment in: 6 months 2)  Your physician  recommends that you continue on your current medications as directed. Please refer to the Current Medication list given to you today. 3)  Your physician has requested that you have a carotid duplex. This test is an ultrasound of the carotid arteries in your neck. It looks at blood flow through these arteries that supply the brain with blood. Allow one hour for this exam. There are no restrictions or special instructions.  To be done in 6 months 4)  Your physician has requested that you have an echocardiogram.  Echocardiography is a painless test that uses sound waves to create images of your heart. It provides your doctor with information about the size and shape of your heart and how well your heart's chambers and valves are working.  This procedure takes approximately one hour. There are no restrictions for this procedure. To be done in 6 months

## 2010-09-30 NOTE — Miscellaneous (Signed)
Summary: Device preload  Clinical Lists Changes  Observations: Added new observation of PPM INDICATN: Syncope with brady (01/13/2010 11:54) Added new observation of MAGNET RTE: BOL 100 ERI  85 (01/13/2010 11:54) Added new observation of PPMLEADSTAT2: active (01/13/2010 11:54) Added new observation of PPMLEADSER2: [id number] (01/13/2010 11:54) Added new observation of PPMLEADMOD2: 1948  (01/13/2010 11:54) Added new observation of PPMLEADLOC2: RV  (01/13/2010 11:54) Added new observation of PPMLEADSTAT1: active  (01/13/2010 11:54) Added new observation of PPMLEADSER1: [id number]  (01/13/2010 11:54) Added new observation of PPMLEADMOD1: 1888TC  (01/13/2010 11:54) Added new observation of PPMLEADLOC1: RA  (01/13/2010 11:54) Added new observation of PPM IMP MD: Hillis Range, MD  (01/13/2010 11:54) Added new observation of PPMLEADDOI2: 01/10/2010  (01/13/2010 11:54) Added new observation of PPMLEADDOI1: 01/10/2010  (01/13/2010 11:54) Added new observation of PPM DOI: 01/10/2010  (01/13/2010 11:54) Added new observation of PPM SERL#: [id number]  (01/13/2010 11:54) Added new observation of PPM MODL#: [id number]  (01/13/2010 29:51) Added new observation of PACEMAKERMFG: [employer] Jude  (01/13/2010 11:54) Added new observation of PACEMAKER MD: Hillis Range, MD  (01/13/2010 11:54)      PPM Specifications Following MD:  Hillis Range, MD     PPM Vendor:  [employer] Jude     PPM Model Number:  [id number]     PPM Serial Number:  [id number] PPM DOI:  01/10/2010     PPM Implanting MD:  Hillis Range, MD  Lead 1    Location: RA     DOI: 01/10/2010     Model #: [id number]     Serial #: [id number]     Status: active Lead 2    Location: RV     DOI: 01/10/2010     Model #: 1948     Serial #: [id number]     Status: active  Magnet Response Rate:  BOL 100 ERI  85  Indications:  Syncope with brady

## 2010-09-30 NOTE — Letter (Signed)
Summary: [facility] [facility] Instructions  [facility]  [address] [place], [place] [address]   Phone: [phone]  Fax: [phone]       [name]    1930-06-10    MRN: [id number]        Procedure Day Dorna Bloom: Wednesday, 10/02/09     Arrival Time: 9:30      Procedure Time: 10:30     Location of Procedure:                    _X _  [facility] (4th Floor)   PREPARATION FOR COLONOSCOPY WITH MOVIPREP   Starting 5 days prior to your procedure 09/27/09 do not eat nuts, seeds, popcorn, corn, beans, peas,  salads, or any raw vegetables.  Do not take any fiber supplements (e.g. Metamucil, Citrucel, and Benefiber).  THE DAY BEFORE YOUR PROCEDURE         DATE: 10/01/09  FUX:NATFTDD  1.  Drink clear liquids the entire day-NO SOLID FOOD  2.  Do not drink anything colored red or purple.  Avoid juices with pulp.  No orange juice.  3.  Drink at least 64 oz. (8 glasses) of fluid/clear liquids during the day to prevent dehydration and help the prep work efficiently.  CLEAR LIQUIDS INCLUDE: Water Jello Ice Popsicles Tea (sugar ok, no milk/cream) Powdered fruit flavored drinks Coffee (sugar ok, no milk/cream) Gatorade Juice: apple, white grape, white cranberry  Lemonade Clear bullion, consomm, broth Carbonated beverages (any kind) Strained chicken noodle soup Hard Candy                             4.  In the morning, mix first dose of MoviPrep solution:    Empty 1 Pouch A and 1 Pouch B into the disposable container    Add lukewarm drinking water to the top line of the container. Mix to dissolve    Refrigerate (mixed solution should be used within 24 hrs)  5.  Begin drinking the prep at 5:00 p.m. The MoviPrep container is divided by 4 marks.   Every 15 minutes drink the solution down to the next mark (approximately 8 oz) until the full liter is complete.   6.  Follow completed prep with 16 oz of clear liquid of your choice (Nothing red or purple).  Continue  to drink clear liquids until bedtime.  7.  Before going to bed, mix second dose of MoviPrep solution:    Empty 1 Pouch A and 1 Pouch B into the disposable container    Add lukewarm drinking water to the top line of the container. Mix to dissolve    Refrigerate  THE DAY OF YOUR PROCEDURE      DATE: 10/02/09  DAY: _Wednesday  Beginning at 5:30 a.m. (5 hours before procedure):         1. Every 15 minutes, drink the solution down to the next mark (approx 8 oz) until the full liter is complete.  2. Follow completed prep with 16 oz. of clear liquid of your choice.    3. You may drink clear liquids until 8:30  (2 HOURS BEFORE PROCEDURE).   MEDICATION INSTRUCTIONS  Unless otherwise instructed, you should take regular prescription medications with a small sip of water   as early as possible the morning of your procedure.  Diabetic patients - see separate instructions.  Stop taking Plavix  on  09/27/09 (Friday)  (5 days before  procedure).               OTHER INSTRUCTIONS  You will need a responsible adult at least 75 years of age to accompany you and drive you home.   This person must remain in the waiting room during your procedure.  Wear loose fitting clothing that is easily removed.  Leave jewelry and other valuables at home.  However, you may wish to bring a book to read or  an iPod/MP3 player to listen to music as you wait for your procedure to start.  Remove all body piercing jewelry and leave at home.  Total time from sign-in until discharge is approximately 2-3 hours.  You should go home directly after your procedure and rest.  You can resume normal activities the  day after your procedure.  The day of your procedure you should not:   Drive   Make legal decisions   Operate machinery   Drink alcohol   Return to work  You will receive specific instructions about eating, activities and medications before you leave.    The above instructions have been  reviewed and explained to me by   _______________________    I fully understand and can verbalize these instructions _____________________________ Date _________

## 2010-09-30 NOTE — Assessment & Plan Note (Signed)
Summary: pc2/ gd   Visit Type:  Follow-up Referring Provider:  Dr Clifton James Primary Provider:  Jarome Matin, MD  CC:  no complaints.  History of Present Illness: The patient presents today for routine electrophysiology followup. She reports doing very well since last being seen in our clinic.  Her exercise tolerance has significantly improved with PPM.  The patient denies symptoms of palpitations, chest pain, shortness of breath, orthopnea, PND, lower extremity edema, dizziness, presyncope, syncope, or neurologic sequela. The patient is tolerating medications without difficulties and is otherwise without complaint today.   Current Medications (verified): 1)  Nitroglycerin 0.4 Mg Subl (Nitroglycerin) .... Place 1 Tablet Under Tongue As Directed 2)  Plavix 75 Mg Tabs (Clopidogrel Bisulfate) .... Take 1 Tablet By Mouth Once A Day 3)  Aspirin 81 Mg Tbec (Aspirin) .... Take One Tablet By Mouth Daily 4)  Hydrochlorothiazide 25 Mg Tabs (Hydrochlorothiazide) .Marland Kitchen.. 1 Tab Once Daily  Allergies: 1)  ! * Latex  Past History:  Past Medical History: Diabetes Mellitus Hyperlipidemia Hypertension CAD-s/p NSTEMI 2003 with stent placement OM1, PTCA with cutting ballloon proximal LAD. 2005 admission with chest pain, Bare metal stent placed in Circumflex and Taxus stent placed in RCA. Mild Aortic stenosis by echo 11/10 Mild MR Grade I Diastolic Relaxation Abnormality Anemia Recurrent syncope due to symptomatic bradycardia-->s/p PPM CRI remote CVA (recovered)  Past Surgical History: Hysterectomy Benign vocal cord tumor removal R knee surgery x 3 with TKA s/p SJM PPM 5/11  Vital Signs:  Patient profile:   77 year old female Height:      68 inches Weight:      224 pounds Pulse rate:   73 / minute BP sitting:   145 / 93  (left arm)  Vitals Entered By: Jacquelin Hawking, CMA (May 02, 2010 9:13 AM)  Physical Exam  General:  elderly, NAD Head:  normocephalic and atraumatic Eyes:   PERRLA/EOM intact; conjunctiva and lids normal. Mouth:  Teeth, gums and palate normal. Oral mucosa normal. Neck:  Neck supple, no JVD. No masses, thyromegaly or abnormal cervical nodes. Chest Wall:  pacemaker site is well healed Lungs:  Clear bilaterally to auscultation and percussion. Heart:  RRR, 2/6 SEM LUSB (mid peaking) Abdomen:  Bowel sounds positive; abdomen soft and non-tender without masses, organomegaly, or hernias noted. No hepatosplenomegaly. Msk:  Back normal, normal gait. Muscle strength and tone normal. Pulses:  pulses normal in all 4 extremities Extremities:  1+ BLE edema Neurologic:  Alert and oriented x 3.   PPM Specifications Following MD:  Hillis Range, MD     PPM Vendor:  [employer]     PPM Model Number:  [id number]     PPM Serial Number:  [id number] PPM DOI:  01/10/2010     PPM Implanting MD:  Hillis Range, MD  Lead 1    Location: [facility]     DOI: 01/10/2010     Model #: [id number]     Serial #: [id number]     Status: active Lead 2    Location: [facility]     DOI: 01/10/2010     Model #: [id number]     Serial #: [id number]     Status: active  Magnet Response Rate:  BOL 100 ERI  85  Indications:  Syncope with brady   PPM Follow Up Remote Check?  No Battery Voltage:  2.95 V     Battery Est. Longevity:  8.2 years     Pacer Dependent:  No  PPM Device Measurements Atrium  Amplitude: 4.5 mV, Impedance: 410 ohms, Threshold: 0.75 V at 0.4 msec Right Ventricle  Amplitude: 12 mV, Impedance: 600 ohms, Threshold: 0.625 V at 0.4 msec  Episodes MS Episodes:  6516     Percent Mode Switch:  <1%     Coumadin:  No Atrial Pacing:  18%     Ventricular Pacing:  89%  Parameters Mode:  DDD     Lower Rate Limit:  60     Upper Rate Limit:  110 Paced AV Delay:  200     Sensed AV Delay:  180 Next Cardiology Appt Due:  12/30/2010 Tech Comments:  Atrial and ventricular auto capture on.  Output reprogrammed for chronic thresholds.  Mode switch episodes ar T-wave oversensing, PVAB  reprogrammed .  ROV 5/12  with Dr. Johney Frame. Altha Harm, LPN  May 02, 2010 9:27 AM  MD Comments:  agree  Impression & Recommendations:  Problem # 1:  SYNCOPE AND COLLAPSE (ICD-780.2)  s/p PPM for symptmatic bradycardia doing well normal pacemaker function changes as above  Her updated medication list for this problem includes:    Nitroglycerin 0.4 Mg Subl (Nitroglycerin) .Marland Kitchen... Place 1 tablet under tongue as directed    Plavix 75 Mg Tabs (Clopidogrel bisulfate) .Marland Kitchen... Take 1 tablet by mouth once a day    Aspirin 81 Mg Tbec (Aspirin) .Marland Kitchen... Take one tablet by mouth daily  Problem # 2:  ESSENTIAL HYPERTENSION, BENIGN (ICD-401.1)  stable salt restriction  Her updated medication list for this problem includes:    Aspirin 81 Mg Tbec (Aspirin) .Marland Kitchen... Take one tablet by mouth daily    Hydrochlorothiazide 25 Mg Tabs (Hydrochlorothiazide) .Marland Kitchen... 1 tab once daily  Patient Instructions: 1)  Your physician recommends that you schedule a follow-up appointment in:   May 2012

## 2010-09-30 NOTE — Assessment & Plan Note (Signed)
Summary: ANEMIA--CH.   History of Present Illness Visit Type: consult  Primary GI MD: Sheryn Bison MD FACP FAGA Primary Provider: Jarome Matin, MD Requesting Provider: Jarome Matin, MD Chief Complaint: Anemia  History of Present Illness:   This patient is a very pleasant 77 year old African American female who has multiple cardiac stents associated with severe coronary artery disease who is on Plavix and aspirin. She was recently hospitalized because of syncope he was found to have iron deficiency anemia of unexplained etiology. She don't oral iron replacement therapy and is currently asymptomatic and denies any gastrointestinal, cardiovascular or pulmonary complaints. She does suffer from hypertension and type 2 diabetes both of which are well controlled on medications per Dr. Jarome Matin.previous GI work up 20 years ago showed a normal endoscopy and flexible sigmoidoscopy.  The patient does have a positive family history of stomach cancer in her sister but no known family history of colon cancer. She has rather marked constipation is on daily MiraLax. She denies melena or hematochezia, anorexia or weight loss or any specific hepatobiliary complaints. She is on aspirin but denies NSAID use. She does not smoke or abuse ethanol.She Has Had Previous Total Abdominal Hysterectomy and also removal of ovaries bilaterally.   GI Review of Systems      Denies abdominal pain, acid reflux, belching, bloating, chest pain, dysphagia with liquids, dysphagia with solids, heartburn, loss of appetite, nausea, vomiting, vomiting blood, weight loss, and  weight gain.        Denies anal fissure, black tarry stools, change in bowel habit, constipation, diarrhea, diverticulosis, fecal incontinence, heme positive stool, hemorrhoids, irritable bowel syndrome, jaundice, light color stool, liver problems, rectal bleeding, and  rectal pain.    Current Medications (verified): 1)  Nitroglycerin 0.4 Mg Subl  (Nitroglycerin) .... Place 1 Tablet Under Tongue As Directed 2)  Plavix 75 Mg Tabs (Clopidogrel Bisulfate) .... Take 1 Tablet By Mouth Once A Day 3)  Metformin Hcl 500 Mg Xr24h-Tab (Metformin Hcl) .Marland Kitchen.. 1 Tab At Bedtime 4)  Amlodipine Besylate 5 Mg Tabs (Amlodipine Besylate) .... Take One Tablet By Mouth Daily 5)  Aspirin 81 Mg Tbec (Aspirin) .... Take One Tablet By Mouth Daily 6)  Crestor 40 Mg Tabs (Rosuvastatin Calcium) .... Take One Tablet By Mouth Daily. 7)  Potassium Chloride Crys Cr 20 Meq Cr-Tabs (Potassium Chloride Crys Cr) .... Take Two Tablets By Mouth Daily 8)  Atenolol 50 Mg Tabs (Atenolol) .... One Tablet By Mouth Two Times A Day 9)  Hydrochlorothiazide 25 Mg Tabs (Hydrochlorothiazide) .... One Tablet By Mouth Once Daily  Allergies (verified): 1)  ! * Latex  Past History:  Past medical, surgical, family and social histories (including risk factors) reviewed for relevance to current acute and chronic problems.  Past Medical History: Reviewed history from 07/30/2009 and no changes required. Diabetes Mellitus Hyperlipidemia Hypertension CAD-s/p NSTEMI 2003 with stent placement OM1, PTCA with cutting ballloon proximal LAD. 2005 admission with chest pain, Bare metal stent placed in Circumflex and Taxus stent placed in RCA. Mild Aortic stenosis Mild MR Grade I Diastolic Relaxation Abnormality  Past Surgical History: Reviewed history from 07/31/2008 and no changes required. Hysterectomy Benign vocal cord tumor removal  Family History: Reviewed history from 07/31/2008 and no changes required. CAD HTN Family History of Diabetes: Mother and Father  Family History of Heart Disease: Mother No FH of Colon Cancer: Family History of Stomach Cancer:Sister   Social History: Reviewed history from 07/31/2008 and no changes required. Remote tobacco use-smoked 1/2ppd for 30 years  but stopped smoking 20 years ago. No alcohol No illicit drug use Widowed 8 children  Review of  Systems       The patient complains of arthritis/joint pain and back pain.  The patient denies allergy/sinus, anemia, anxiety-new, blood in urine, breast changes/lumps, change in vision, confusion, cough, coughing up blood, depression-new, fainting, fatigue, fever, headaches-new, hearing problems, heart murmur, heart rhythm changes, itching, menstrual pain, muscle pains/cramps, night sweats, nosebleeds, pregnancy symptoms, shortness of breath, skin rash, sleeping problems, sore throat, swelling of feet/legs, swollen lymph glands, thirst - excessive , urination - excessive , urination changes/pain, urine leakage, vision changes, and voice change.    Vital Signs:  Patient profile:   77 year old female Height:      68 inches Weight:      220 pounds BMI:     33.57 BSA:     2.13 Pulse rate:   76 / minute Pulse rhythm:   regular BP sitting:   132 / 60  (left arm) Cuff size:   regular  Vitals Entered By: Ok Anis CMA (September 19, 2009 10:06 AM)   Physical Exam  General:  Well developed, well nourished, no acute distress.healthy appearing.   Head:  Normocephalic and atraumatic. Eyes:  PERRLA, no icterus.exam deferred to patient's ophthalmologist.   Neck:  Supple; no masses or thyromegaly. Lungs:  Clear throughout to auscultation. Heart:  2/6 systolic ejection murmur noted at the left lower sternal border without S3 gallop, clicks or rubs. Abdomen:  Soft, nontender and nondistended. No masses, hepatosplenomegaly or hernias noted. Normal bowel sounds. Extremities:  No clubbing, cyanosis, edema or deformities noted. Neurologic:  Alert and  oriented x4;  grossly normal neurologically. Cervical Nodes:  No significant cervical adenopathy. Axillary Nodes:  No significant axillary adenopathy. Inguinal Nodes:  No significant inguinal adenopathy. Psych:  Alert and cooperative. Normal mood and affect.   Impression & Recommendations:  Problem # 1:  ANEMIA (ICD-285.9) Assessment  Improved Concern for chronic GI blood loss and possible colon carcinoma. Also noted is a rather advancing constipation problem improved with MiraLax use. Endoscopy and colonoscopy and is scheduled along with followup labs. I asked her to continue oral iron replacement therapy and MiraLax. I have also urged her to be sure she has an adequate bowel prep before her colonoscopy. We will hold her Plavix 5 days before her colonoscopy unless otherwise advised by cardiology or Dr. Jarold Motto. Orders: TLB-B12, Serum-Total ONLY (11914-N82) TLB-Ferritin (82728-FER) TLB-Folic Acid (Folate) (82746-FOL) TLB-IBC Pnl (Iron/FE;Transferrin) (83550-IBC) TLB-CBC Platelet - w/Differential (85025-CBCD) Colon/Endo (Colon/Endo)  Problem # 2:  CHEST PAIN (ICD-786.50) Assessment: Improved She has multiple stents in stable cardiovascular symptomatology. Her anemia seemed to be improving with iron replacement. She denies chronic reflux symptoms and is not on PPI therapy, but I think endoscopic exam is warranted. Again adjustments will be made in her anticoagulants and her diabetic medications.  Problem # 3:  LOSS OF APPETITE (ICD-783.0) Assessment: Improved  Problem # 4:  DM (ICD-250.00) Assessment: Improved  Problem # 5:  ESSENTIAL HYPERTENSION, BENIGN (ICD-401.1) Assessment: Improved blood pressure  today was 132/60 and pulse was 76 and regular.she does have Had a history of mild aortic stenosis and apparently mild mitral regurgitation.  Patient Instructions: 1)  Copy sent to : Dr. Jarome Matin and Dr. Melene Muller at [facility] cardiology 2)  Please continue current medications.  3)  Colonoscopy and Flexible Sigmoidoscopy brochure given.  4)  Conscious Sedation brochure given.  5)  Upper Endoscopy brochure given.  6)  Hold Plavix 5 days before colonoscopy. 7)  Diabetic medication adjustment explained. 8)  The labs ordered 9)  The medication list was reviewed and reconciled.  All changed / newly  prescribed medications were explained.  A complete medication list was provided to the patient / caregiver. Prescriptions: MOVIPREP 100 GM  SOLR (PEG-KCL-NACL-NASULF-NA ASC-C) As per prep instructions.  #1 x 0   Entered by:   Ashok Cordia RN   Authorized by:   Mardella Layman MD [facility]   Signed by:   Ashok Cordia RN on 09/19/2009   Method used:   Electronically to        [facility] [address] [phone]* (retail)       [address]       [place], [place]  [address]       Ph: [phone]       Fax: [phone]   RxID:   [id number]

## 2010-09-30 NOTE — Letter (Signed)
Summary: Implantable Device Instructions  Architectural technologist, Main Office  [address]   [place], [place] [address]   Phone: [phone]  Fax: [phone]      Implantable Device Instructions  You are scheduled for:  _____ Permanent Transvenous Pacemaker   on 01/10/10 with Dr. Johney Frame.  1.  Please arrive at the Short Stay Center at [facility] at 8:30am on the day of your procedure.  2.  Do not eat or drink  after midnight the night before your procedure.  3.  Complete lab work on 01/06/10.  You do not have to be fasting.  4.  Do NOT take these medications starting 01/06/10 prior to your procedure:  Plavix.   5.  Plan for an overnight stay.  Bring your insurance cards and a list of your medications.  6.  Wash your chest and neck with antibacterial soap (any brand) the evening before and the morning of your procedure.  Rinse well.  7.  Education material received:     Pacemaker _____            *If you have ANY questions after you get home, please call the office [phone].  Anselm Pancoast  *Every attempt is made to prevent procedures from being rescheduled.  Due to the nauture of Electrophysiology, rescheduling can happen.  The physician is always aware and directs the staff when this occurs.

## 2010-09-30 NOTE — Assessment & Plan Note (Signed)
Summary: dizziness/pla   Visit Type:  Follow-up Referring Provider:  Jarome Matin, MD Primary Provider:  Jarome Matin, MD  CC:  dizziness.  History of Present Illness: [name] is a pleasant 77 year old African American female with a past medical history significant for coronary artery disease, status post placement of stents in the circumflex and right coronary arteries in 2005 by Dr. Juanda Chance, as well as hypertension, mild AS, hyperlipidemia, obesity, and diabetes mellitus, who comes in today for routine cardiac followup. She has been doing well. Most recent carotid dopplers showed mild disease bilaterally (0-39% RICA, LICA). She was admitted to [facility] in November with pre-renal azotemia with hypotension, bradycardia and syncope and had an echo with normal LV function and no wall motion abnormalities. Her AS has been mild.  I last saw her in March 2011  and she was doing well. She is here today as an add on with complaints of dizziness. She reportedly passed out twice earlier this week. She tells me that the first episode was 2 weeks ago. She was standing at the sink and woke up sitting on the floor. She went to bed and felt ok. The next episode was Saturday and she was in the kitchen at the sink. She turned her head and then felt dizzy and passed out. She has not been drinking alot of fluids lately. She has had no recent sinus infections or viral illnesses. No dark black stools. She does have a history of anemia followed by Dr Eloise Harman. Her syncope has been preceded by a "trembling" in her chest. No chest pain or SOB.   Orthostatics negative today in our office.     Current Medications (verified): 1)  Nitroglycerin 0.4 Mg Subl (Nitroglycerin) .... Place 1 Tablet Under Tongue As Directed 2)  Plavix 75 Mg Tabs (Clopidogrel Bisulfate) .... Take 1 Tablet By Mouth Once A Day 3)  Metformin Hcl 500 Mg Xr24h-Tab (Metformin Hcl) .Marland Kitchen.. 1 Tab At Bedtime 4)  Amlodipine Besylate 5 Mg  Tabs (Amlodipine Besylate) .... Take One Tablet By Mouth Daily 5)  Aspirin 81 Mg Tbec (Aspirin) .... Take One Tablet By Mouth Daily 6)  Crestor 40 Mg Tabs (Rosuvastatin Calcium) .... Take One Tablet By Mouth Daily. 7)  Potassium Chloride Crys Cr 20 Meq Cr-Tabs (Potassium Chloride Crys Cr) .... Take Two Tablets By Mouth Daily 8)  Atenolol 50 Mg Tabs (Atenolol) .... One Tablet By Mouth Two Times A Day 9)  Hydrochlorothiazide 25 Mg Tabs (Hydrochlorothiazide) .... One Tablet By Mouth Once Daily  Allergies: 1)  ! * Latex  Past History:  Past Medical History: Diabetes Mellitus Hyperlipidemia Hypertension CAD-s/p NSTEMI 2003 with stent placement OM1, PTCA with cutting ballloon proximal LAD. 2005 admission with chest pain, Bare metal stent placed in Circumflex and Taxus stent placed in RCA. Mild Aortic stenosis by echo 11/10 Mild MR Grade I Diastolic Relaxation Abnormality Anemia  Past Surgical History: Reviewed history from 07/31/2008 and no changes required. Hysterectomy Benign vocal cord tumor removal  Family History: Reviewed history from 09/19/2009 and no changes required. CAD HTN Family History of Diabetes: Mother and Father  Family History of Heart Disease: Mother No FH of Colon Cancer: Family History of Stomach Cancer:Sister   Social History: Reviewed history from 07/31/2008 and no changes required. Remote tobacco use-smoked 1/2ppd for 30 years but stopped smoking 20 years ago. No alcohol No illicit drug use Widowed 8 children  Review of Systems       The patient complains of fatigue, palpitations, fainting,  and dizziness.  The patient denies malaise, fever, weight gain/loss, vision loss, decreased hearing, hoarseness, chest pain, shortness of breath, prolonged cough, wheezing, sleep apnea, coughing up blood, abdominal pain, blood in stool, nausea, vomiting, diarrhea, heartburn, incontinence, blood in urine, muscle weakness, joint pain, leg swelling, rash, skin lesions,  headache, depression, anxiety, enlarged lymph nodes, easy bruising or bleeding, and environmental allergies.    Vital Signs:  Patient profile:   77 year old female Height:      68 inches Weight:      201.75 pounds BMI:     30.79 Pulse rate:   55 / minute Pulse (ortho):   60 / minute Pulse rhythm:   irregular Resp:     20 per minute BP sitting:   114 / 60  (right arm) Cuff size:   large  Vitals Entered By: Vikki Ports (December 12, 2009 10:12 AM)  Serial Vital Signs/Assessments:  Time      Position  BP       Pulse  Resp  Temp     By 10:22 AM  Lying RA  120/64   53                    [name] 10:24 AM  Sitting   114/60   54                    [name] 10:26 AM  Standing  106/58   60                    [name] 10:28 AM  Standing  114/65   60                    [name] 10:31 AM  Standing  110/61   63                    [name]   Physical Exam  General:  General: Well developed, well nourished, NAD HEENT: OP clear, mucus membranes moist SKIN: warm, dry Neuro: No focal deficits Musculoskeletal: Muscle strength 5/5 all ext Psychiatric: Mood and affect normal Neck: No JVD, no carotid bruits, no thyromegaly, no lymphadenopathy. Lungs:Clear bilaterally, no wheezes, rhonci, crackles CV: RRR with II/VI  systolic  murmur, No gallops rubs Abdomen: soft, NT, ND, BS present Extremities: No edema, pulses 2+.    EKG  Procedure date:  12/12/2009  Findings:      Sinus bradycardia, rate 56 bpm. 1st degree AV block.  Non-specific T wave abnormalities. Unchanged from prior EKG  Impression & Recommendations:  Problem # 1:  SYNCOPE AND COLLAPSE (ICD-780.2)  She has mild known carotid artery disease with recent follow up. Her LV systolic function is known to be normal. Her aortic valve stenosis is mild. I cannot exclude arrhythmia as a cause of her syncope. She may also be feeling tired and dizzy because of her bradycardia on beta blocker therapy. I will stop  the atenolol. I have asked her to drink 8 glasses of water per day. I will have her wear a 48 hour monitor to look for arrhythmias. Will check BMET, CBC and TSH today. I will see her back in one week.   The following medications were removed from the medication list:    Atenolol 50 Mg Tabs (Atenolol) ..... One tablet by mouth two times a day Her updated medication list for this problem includes:    Nitroglycerin 0.4 Mg Subl (Nitroglycerin) .Marland KitchenMarland KitchenMarland KitchenMarland Kitchen  Place 1 tablet under tongue as directed    Plavix 75 Mg Tabs (Clopidogrel bisulfate) .Marland Kitchen... Take 1 tablet by mouth once a day    Amlodipine Besylate 5 Mg Tabs (Amlodipine besylate) .Marland Kitchen... Take one tablet by mouth daily    Aspirin 81 Mg Tbec (Aspirin) .Marland Kitchen... Take one tablet by mouth daily  Orders: EKG w/ Interpretation (93000) Holter Monitor (Holter Monitor) TLB-BMP (Basic Metabolic Panel-BMET) (80048-METABOL) TLB-CBC Platelet - w/Differential (85025-CBCD) TLB-TSH (Thyroid Stimulating Hormone) (84443-TSH)  Problem # 2:  CAD, NATIVE VESSEL (ICD-414.01) Stable. I do not think her dizziness is driven by myocardial ischemia.  The following medications were removed from the medication list:    Atenolol 50 Mg Tabs (Atenolol) ..... One tablet by mouth two times a day Her updated medication list for this problem includes:    Nitroglycerin 0.4 Mg Subl (Nitroglycerin) .Marland Kitchen... Place 1 tablet under tongue as directed    Plavix 75 Mg Tabs (Clopidogrel bisulfate) .Marland Kitchen... Take 1 tablet by mouth once a day    Amlodipine Besylate 5 Mg Tabs (Amlodipine besylate) .Marland Kitchen... Take one tablet by mouth daily    Aspirin 81 Mg Tbec (Aspirin) .Marland Kitchen... Take one tablet by mouth daily  Patient Instructions: 1)  Your physician recommends that you schedule a follow-up appointment in: 1 week 2)  Your physician has recommended you make the following change in your medication: Stop atenolol 3)  Your physician has recommended that you wear a holter monitor.  Holter monitors are medical devices  that record the heart's electrical activity. Doctors most often use these monitors to diagnose arrhythmias. Arrhythmias are problems with the speed or rhythm of the heartbeat. The monitor is a small, portable device. You can wear one while you do your normal daily activities. This is usually used to diagnose what is causing palpitations/syncope (passing out). 4)  Increase intake of fluids

## 2010-09-30 NOTE — Procedures (Signed)
Summary: Event  Event   Imported By: Marylou Mccoy 01/13/2010 12:31:34  _____________________________________________________________________  External Attachment:    Type:   Image     Comment:   External Document

## 2010-09-30 NOTE — Letter (Signed)
Summary: ER Notification  Architectural technologist, Main Office  [address]   [place], [place] [address]   Phone: [phone]  Fax: [phone]    December 12, 2009 2:30 PM  [name]  The above referenced patient has been advised to report directly to the Emergency Room. Please see below for more information:  Dx: __abnormal labs  potassium 2.7, creatinine 5.6__________     Private Vehicle  _____X__________ or EMS:  ________________   Orders:  Yes ______ or No  __X_____   Notify upon arrival:           _please call Internal medicine to see pt ________________   Thank you,    [facility] Staff

## 2010-09-30 NOTE — Progress Notes (Signed)
Summary: question about pacemaker  Phone Note Call from Patient Call back at Home Phone [phone]   Caller: Patient Summary of Call: Pt have question about unpluging her pacemaker to take a trip Initial call taken by: Judie Grieve,  February 27, 2010 12:52 PM  Follow-up for Phone Call        Spoke with son, no need to take transmitter if not gone more than 2 weeks. Gypsy Balsam RN BSN  February 28, 2010 4:17 PM

## 2010-09-30 NOTE — Procedures (Signed)
Summary: Holter and Event  Holter and Event   Imported By: Erle Crocker 01/01/2010 08:50:31  _____________________________________________________________________  External Attachment:    Type:   Image     Comment:   External Document

## 2010-09-30 NOTE — Progress Notes (Signed)
Summary: dizzy/passed out  Phone Note Call from Patient Call back at Home Phone [phone]   Caller: Patient Reason for Call: Talk to Nurse Summary of Call: pt is very dizzy, has passed out 2x in the past week Initial call taken by: Migdalia Dk,  December 09, 2009 10:34 AM  Follow-up for Phone Call        Pt reports dizziness and then "falling out" on Friday and Saturday. Was aware of feeling coming on and tried to get to chair but fell onto floor.  Taking all meds as prescribed. No new medications.  Has bp cuff but has not been checking blood pressure. Will begin to check bp and record.  Had a "cold" last week but it went away on own without treatment. States she is feeling better today.  She has no one to bring her to office today. Pt added on to Dr. Gibson Ramp schedule for December 12, 2009 at 10 AM.  Additional Follow-up for Phone Call Additional follow up Details #1::        I agree. cdm Additional Follow-up by: Verne Carrow, MD,  December 09, 2009 5:11 PM

## 2010-09-30 NOTE — Procedures (Signed)
Summary: wch./ gd   Current Medications (verified): 1)  Nitroglycerin 0.4 Mg Subl (Nitroglycerin) .... Place 1 Tablet Under Tongue As Directed 2)  Plavix 75 Mg Tabs (Clopidogrel Bisulfate) .... Take 1 Tablet By Mouth Once A Day 3)  Aspirin 81 Mg Tbec (Aspirin) .... Take One Tablet By Mouth Daily 4)  Nu-Iron 150 Mg Caps (Polysaccharide Iron Complex) .... Two Times A Day  Allergies (verified): 1)  ! * Latex  PPM Specifications Following MD:  Hillis Range, MD     PPM Vendor:  [employer]     PPM Model Number:  [id number]     PPM Serial Number:  [id number] PPM DOI:  01/10/2010     PPM Implanting MD:  Hillis Range, MD  Lead 1    Location: RA     DOI: 01/10/2010     Model #: [id number]     Serial #: [id number]     Status: active Lead 2    Location: RV     DOI: 01/10/2010     Model #: 1948     Serial #: [id number]     Status: active  Magnet Response Rate:  BOL 100 ERI  85  Indications:  Syncope with brady   PPM Follow Up Remote Check?  No Battery Voltage:  3.10 V     Battery Est. Longevity:  6 years     Pacer Dependent:  No       PPM Device Measurements Atrium  Amplitude: 3.1 mV, Impedance: 380 ohms, Threshold: 0.5 V at 0.4 msec Right Ventricle  Amplitude: 12 mV, Impedance: 640 ohms, Threshold: 0.625 V at 0.4 msec  Episodes MS Episodes:  6     Percent Mode Switch:  <1%     Coumadin:  No Atrial Pacing:  3.5%     Ventricular Pacing:  85%DDD  Parameters Mode:  DDD     Lower Rate Limit:  60     Upper Rate Limit:  110 Paced AV Delay:  200     Sensed AV Delay:  180 Next Cardiology Appt Due:  05/02/2010 Tech Comments:  A cap confirm and ventricular auto capture on.  Steri strips removed.  No redness or edema noted.  Activity restrictions review with the patient.  ROV 3 months with Dr. Johney Frame. Altha Harm, LPN  Jan 20, 2010 12:28 PM    Patient Instructions: 1)  No lifting more than 10lbs with the left arm for 2 weeks.  You may wash the area with soap and water but not lotions or oils for 2 weeks.  You  have a return visit scheduled with Dr. Johney Frame on May 02, 2010 @ 9am. 2)  Altha Harm, LPN  Jan 20, 2010 12:18 PM

## 2010-09-30 NOTE — Letter (Signed)
Summary: Diabetic Instructions  [facility]  [address] [place], [place] [address]   Phone: [phone]  Fax: [phone]    [name] 11-09-1929 MRN: [id number]   _X  _   ORAL DIABETIC MEDICATION INSTRUCTIONS  The day before your procedure:   Take your diabetic pill as you do normally  The day of your procedure:   Do not take your diabetic pill    We will check your blood sugar levels during the admission process and again in Recovery before discharging you home  ________________________________________________________________________  _  _   INSULIN (LONG ACTING) MEDICATION INSTRUCTIONS (Lantus, NPH, 70/30, Humulin, Novolin-N)   The day before your procedure:   Take  your regular evening dose    The day of your procedure:   Do not take your morning dose    _  _   INSULIN (SHORT ACTING) MEDICATION INSTRUCTIONS (Regular, Humulog, Novolog)   The day before your procedure:   Do not take your evening dose   The day of your procedure:   Do not take your morning dose   _  _   INSULIN PUMP MEDICATION INSTRUCTIONS  We will contact the physician managing your diabetic care for written dosage instructions for the day before your procedure and the day of your procedure.  Once we have received the instructions, we will contact you.

## 2010-09-30 NOTE — Procedures (Signed)
Summary: summary report  summary report   Imported By: Mirna Mires 12/27/2009 08:43:30  _____________________________________________________________________  External Attachment:    Type:   Image     Comment:   External Document

## 2010-09-30 NOTE — Progress Notes (Signed)
Summary: Abnormal lab  Phone Note Other Incoming   Summary of Call: Message RN received call from [place] in lab. BMP shows potassium 2.7 and creatinine 5.6. Message RN discussed with Dr. Clifton James who wants pt to go to ER at [facility] to be admitted by Internal Medicine.  I notified pt of this. She will have son transport her to [facility] hospital right away.  Will notify ER. Initial call taken by: Dossie Arbour, RN, BSN,  December 12, 2009 2:28 PM

## 2010-09-30 NOTE — Progress Notes (Signed)
Summary: resend prep  Phone Note Call from Patient Call back at Home Phone [phone]   Caller: Patient Call For: Jarold Motto Reason for Call: Talk to Nurse Summary of Call: Patient wants for [place] to resend the movi prep to [facility] on [address] Initial call taken by: Tawni Levy,  September 25, 2009 2:05 PM    Prescriptions: MOVIPREP 100 GM  SOLR (PEG-KCL-NACL-NASULF-NA ASC-C) As per prep instructions.  #1 x 0   Entered by:   Ashok Cordia RN   Authorized by:   Mardella Layman MD [facility]   Signed by:   Ashok Cordia RN on 09/25/2009   Method used:   Electronically to        [facility] [address] [phone]* (retail)       [address]       [place], [place]  [address]       Ph: [phone]       Fax: [phone]   RxID:   [id number]

## 2010-09-30 NOTE — Progress Notes (Signed)
Summary: [facility]  Phone Note Outgoing Call   Call placed to: Patient Summary of Call: Received call from [facility] stating pt had 3 second pause this AM at 8:44 (CST). . They were unable to reach pt. I spoke with pt at 10:05 this AM (see earlier phone note from today) and she is feeling fine with no symptoms. Strips received from [facility]. Additonal strip from 4:44(CST) received showing 3 second pause. Strips also show Wenkebach. Strips and pt history reviewed by Dr.Crenshaw (DOD).  I called pt at approximately 11:10 today to verify meds and she again states she is having no symptoms and is feeling good.  She is taking Plavix 75 mg by mouth daily, ASA 81 mg by mouth daily, iron 150 mg by mouth two times a day. Per Dr. Jens Som will review with Dr. Graciela Husbands today.  Initial call taken by: Dossie Arbour, RN, BSN,  Jan 01, 2010 11:36 AM  Follow-up for Phone Call        Rhythm strips reviewed and reveal sinus bradycardia and Wenkebacke; occasional 3 second pauses; given history of syncope, will most likely need pacemaker; will review with EP today. Ferman Hamming, MD, [facility]  Jan 01, 2010 11:53 AM      Appended Document: [facility] Dr. Jens Som reviewed with Dr. Graciela Husbands. Pt will need appt with EP on Monday 01/06/10. Pt notified and she will come in for appt with Dr. Johney Frame on 01/06/10 at 12:30.

## 2010-09-30 NOTE — Assessment & Plan Note (Signed)
Summary: new pt/second degree heart block/pla   Visit Type:  Follow-up Referring Provider:  Dr Clifton  Primary Provider:  Jarome Matin, MD  CC:  syncope.  History of Present Illness: [name] is a pleasant 77 yo AAF with a h/o mild aortic stenosis, CAD, HTN, and syncope who presents for EP consultation.  She reports having syncope on three recent episodes.  She reports that her first episode of syncope occured 11/10.  She had several weeks of dizziness and then subsequent syncope while walking to the bathroom.  She was found to be very bradycardic at that time and therefore atenolol and HCTZ were discontinued.  Her second episode of syncope occured several months later.  She reports standing in the kitchen, when she became presyncopal.  She turned her head and felt real dizzy.  She sat down.  She reports brief LOC but without trauma or fall.  Her most recent episode of syncope occured in early April.  She was hospitalized 4/11 for renal failure and was observed to have sinus bradycardia with 2 second pauses at that time.   Most recent carotid dopplers showed mild disease bilaterally (0-39% RICA, LICA). She also had an echo with normal LV function and no wall motion abnormalities. Her AS has been mild.  A holter monitor (48 hours) 12/12/09 revealed HR 47-70 bpm (average 55 bpm) with pauses up to 2.3 seconds and second degree AV block.  She is now wearing a lifewatch monitor.  This has documented pauses over 3 seconds in addition to AV block with consecutive P waves not conducted.   Current Medications (verified): 1)  Nitroglycerin 0.4 Mg Subl (Nitroglycerin) .... Place 1 Tablet Under Tongue As Directed 2)  Plavix 75 Mg Tabs (Clopidogrel Bisulfate) .... Take 1 Tablet By Mouth Once A Day 3)  Aspirin 81 Mg Tbec (Aspirin) .... Take One Tablet By Mouth Daily 4)  Nu-Iron 150 Mg Caps (Polysaccharide Iron Complex) .... Two Times A Day  Allergies (verified): 1)  ! * Latex  Past History:  Past  Medical History: Diabetes Mellitus Hyperlipidemia Hypertension CAD-s/p NSTEMI 2003 with stent placement OM1, PTCA with cutting ballloon proximal LAD. 2005 admission with chest pain, Bare metal stent placed in Circumflex and Taxus stent placed in RCA. 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 CAD HTN Family History of Diabetes: Mother and Father  Family History of Heart Disease: Mother No FH of Colon Cancer: Family History of Stomach Cancer:Sister   Social History: Lives in [place] with her sons.  Remote tobacco use-smoked 1/2ppd for 30 years but stopped smoking 20 years ago. No alcohol No illicit drug use Widowed 8 children  Review of Systems       All systems are reviewed and negative except as listed in the HPI.   Vital Signs:  Patient profile:   77 year old female Height:      68 inches Weight:      213 pounds BMI:     32.50 Pulse rate:   74 / minute BP sitting:   150 / 84  (right arm)  Vitals Entered By: Laurance Flatten CMA (Jan 06, 2010 3:34 PM)  Physical Exam  General:  elderly, NAD Head:  normocephalic and atraumatic Eyes:  PERRLA/EOM intact; conjunctiva and lids normal.  Nose:  no deformity, discharge, inflammation, or lesions Mouth:  Teeth, gums and palate normal. Oral mucosa normal. Neck:  Neck supple, no JVD. No masses, thyromegaly or abnormal cervical nodes. Lungs:  Clear bilaterally to auscultation and percussion. Heart:  RRR, 2/6 SEM LUSB (mid peaking) Abdomen:  Bowel sounds positive; abdomen soft and non-tender without masses, organomegaly, or hernias noted. No hepatosplenomegaly. Msk:  Back normal, normal gait. Muscle strength and tone normal. Pulses:  pulses normal in all 4  extremities Extremities:  1+ BLE edema Neurologic:  Alert and oriented x 3. Skin:  Intact without lesions or rashes. Cervical Nodes:  no significant adenopathy Psych:  Normal affect. Additional Exam:  carotid massage negative bilaterally   EKG  Procedure date:  12/12/2009  Findings:      sinus bradycardia 56 bpm, PR 226, QT 430, nonspecific ST/T changes  Impression & Recommendations:  Problem # 1:  SYNCOPE AND COLLAPSE (ICD-780.2) The patient has recurrent syncope.  She has persistent bradycardia off of beta blockers.  Her lifewatch monitor has documented sinus pauses up to 3 seconds, clear mobitz I second degree av block, as well as probable mobitz II second degree AV  block with multiple p waves not conducted.  Given these findings, I feel that her syncope was due to symtomatic bradycardia. I have recommended PPM implantation.  Risks, benefits, alternatives to PPM implantation were discussed in detail with the patient and her daughter today.  They understand  that the risks include but are not limited to bleeding, infection, pneumothorax, perforation, tamponade, vascular damage, renal failure, MI, stroke, death,  and lead dislodgement.  The patient accepts these risks and wishes to proceed with PPM implant at the next available time.  She is instructed to not drive in the interim.  Problem # 2:  ESSENTIAL HYPERTENSION, BENIGN (ICD-401.1) stable no changes today  Problem # 3:  AORTIC STENOSIS (ICD-424.1) mild no changes  Problem # 4:  HYPERCHOLESTEROLEMIA (ICD-272.0) stable The following medications were removed from the medication list:    Crestor 40 Mg Tabs (Rosuvastatin calcium) .Marland Kitchen... Take one tablet by mouth daily.  Other Orders: TLB-BMP (Basic Metabolic Panel-BMET) (80048-METABOL) TLB-CBC Platelet - w/Differential (85025-CBCD) TLB-PT (Protime) (85610-PTP) TLB-PTT (85730-PTTL)

## 2010-09-30 NOTE — Assessment & Plan Note (Signed)
Summary: 6 month follow up,/414.01/pla   Visit Type:  6 mo f/u Referring Provider:  Dr Clifton James Primary Provider:  Jarome Matin, MD  CC:  edema/right leg at times....denies any cp or sob.  History of Present Illness: [name] is a pleasant 77 year old African American female with a past medical history significant for coronary artery disease, status post placement of stents in the circumflex and right coronary arteries in 2005 by Dr. Juanda Chance, as well as hypertension, mild AS, hyperlipidemia, obesity, and diabetes mellitus and high grade AV block now s/p PPM, who comes in today for routine cardiac followup. She has been doing well. Most recent carotid dopplers showed mild disease bilaterally (0-39% RICA, LICA). She was admitted to [facility] in November with pre-renal azotemia with hypotension, bradycardia and syncope and had an echo with normal LV function and no wall motion abnormalities. Her AS has been mild. She had a pacemaker placed by Dr. Johney Frame in [id number] for second degree AV block. She has been feeling much better since her pacemaker was placed. No chest pain or SOB. Mild swelling in left lower extremity, chronic and resolves at night.    Current Medications (verified): 1)  Nitroglycerin 0.4 Mg Subl (Nitroglycerin) .... Place 1 Tablet Under Tongue As Directed 2)  Plavix 75 Mg Tabs (Clopidogrel Bisulfate) .... Take 1 Tablet By Mouth Once A Day 3)  Aspirin 81 Mg Tbec (Aspirin) .... Take One Tablet By Mouth Daily 4)  Hydrochlorothiazide 25 Mg Tabs (Hydrochlorothiazide) .Marland Kitchen.. 1 Tab Once Daily  Allergies: 1)  ! * Latex  Past History:  Past Medical History: Diabetes Mellitus Hyperlipidemia Hypertension CAD-s/p NSTEMI 2003 with stent placement OM1, PTCA with cutting ballloon proximal LAD. 2005 admission with chest pain, Bare metal stent placed in Circumflex and Taxus stent placed in RCA. Mild Aortic stenosis by echo 11/10 Mild MR Grade I Diastolic Relaxation  Abnormality Anemia Recurrent syncope due to symptomatic bradycardia-->s/p PPM CRI remote CVA (recovered) AV block-now s/p PPM May 2011  Social History: Reviewed history from 01/06/2010 and no changes required. Lives in [place] with her sons.  Remote tobacco use-smoked 1/2ppd for 30 years but stopped smoking 20 years ago. No alcohol No illicit drug use Widowed 8 children  Review of Systems       The patient complains of leg swelling.  The patient denies fatigue, malaise, fever, weight gain/loss, vision loss, decreased hearing, hoarseness, chest pain, palpitations, shortness of breath, prolonged cough, wheezing, sleep apnea, coughing up blood, abdominal pain, blood in stool, nausea, vomiting, diarrhea, heartburn, incontinence, blood in urine, muscle weakness, joint pain, rash, skin lesions, headache, fainting, dizziness, depression, anxiety, enlarged lymph nodes, easy bruising or bleeding, and environmental allergies.    Vital Signs:  Patient profile:   77 year old female Height:      68 inches Weight:      221.8 pounds BMI:     33.85 Pulse rate:   72 / minute Pulse rhythm:   regular BP sitting:   142 / 70  (left arm) Cuff size:   regular  Vitals Entered By: [name], CMA (May 13, 2010 10:31 AM)  Physical Exam  General:  General: Well developed, well nourished, NAD Musculoskeletal: Muscle strength 5/5 all ext Psychiatric: Mood and affect normal Neck: No JVD, no carotid bruits, no thyromegaly, no lymphadenopathy. Lungs:Clear bilaterally, no wheezes, rhonci, crackles CV: RRR no murmurs, gallops rubs Abdomen: soft, NT, ND, BS present Extremities: Trace bilateral lower ext  edema, pulses 1+.    PPM Specifications Following  MD:  Hillis Range, MD     PPM Vendor:  [employer]     PPM Model Number:  [id number]     PPM Serial Number:  [id number] PPM DOI:  01/10/2010     PPM Implanting MD:  Hillis Range, MD  Lead 1    Location: RA     DOI: 01/10/2010     Model #: [id number]      Serial #: [id number]     Status: active Lead 2    Location: RV     DOI: 01/10/2010     Model #: 1948     Serial #: [id number]     Status: active  Magnet Response Rate:  BOL 100 ERI  85  Indications:  Syncope with brady   PPM Follow Up Pacer Dependent:  No      Episodes Coumadin:  No  Parameters Mode:  DDD     Lower Rate Limit:  60     Upper Rate Limit:  110 Paced AV Delay:  200     Sensed AV Delay:  180  Impression & Recommendations:  Problem # 1:  CAD, NATIVE VESSEL (ICD-414.01) Stable. No angina. Continue ASA, Plavix. Will start Coreg 6.25 mg by mouth two times a day.   Her updated medication list for this problem includes:    Nitroglycerin 0.4 Mg Subl (Nitroglycerin) .Marland Kitchen... Place 1 tablet under tongue as directed    Plavix 75 Mg Tabs (Clopidogrel bisulfate) .Marland Kitchen... Take 1 tablet by mouth once a day    Aspirin 81 Mg Tbec (Aspirin) .Marland Kitchen... Take one tablet by mouth daily    Carvedilol 6.25 Mg Tabs (Carvedilol) .Marland Kitchen... Take one tablet by mouth twice a day  Problem # 2:  ESSENTIAL HYPERTENSION, BENIGN (ICD-401.1) Will start Coreg 6.25 mg by mouth two times a day. Continue HCTZ 25 mg per day. She will check her BP daily and let us know her readings next week.   Her updated medication list for this problem includes:    Aspirin 81 Mg Tbec (Aspirin) .Marland Kitchen... Take one tablet by mouth daily    Hydrochlorothiazide 25 Mg Tabs (Hydrochlorothiazide) .Marland Kitchen... 1 tab once daily  Patient Instructions: 1)  Your physician recommends that you schedule a follow-up appointment in: 6 months 2)  Your physician has recommended you make the following change in your medication:  3)  START Carvedilol 6.25mg  by mouth two times a day  Prescriptions: CARVEDILOL 6.25 MG TABS (CARVEDILOL) Take one tablet by mouth twice a day  #60 x 8   Entered by:   Whitney Maeola Sarah RN   Authorized by:   Verne Carrow, MD   Signed by:   Ellender Hose RN on 05/13/2010   Method used:   Electronically to        [facility] [phone]* (retail)       [address]       [place], [place]  [address]       Ph: [phone]       Fax: [phone]   RxID:   [id number]

## 2010-10-03 NOTE — Cardiovascular Report (Signed)
Summary: Pre Op Orders  Pre Op Orders   Imported By: Roderic Ovens 01/16/2010 13:22:11  _____________________________________________________________________  External Attachment:    Type:   Image     Comment:   External Document

## 2010-10-31 ENCOUNTER — Ambulatory Visit (INDEPENDENT_AMBULATORY_CARE_PROVIDER_SITE_OTHER): Payer: Medicare Other | Admitting: Cardiovascular Disease

## 2010-10-31 ENCOUNTER — Encounter: Payer: Self-pay | Admitting: Cardiovascular Disease

## 2010-10-31 ENCOUNTER — Other Ambulatory Visit (HOSPITAL_COMMUNITY): Payer: Medicare Other

## 2010-10-31 DIAGNOSIS — I10 Essential (primary) hypertension: Secondary | ICD-10-CM

## 2010-10-31 DIAGNOSIS — E7800 Pure hypercholesterolemia, unspecified: Secondary | ICD-10-CM

## 2010-10-31 DIAGNOSIS — I359 Nonrheumatic aortic valve disorder, unspecified: Secondary | ICD-10-CM

## 2010-10-31 DIAGNOSIS — I2510 Atherosclerotic heart disease of native coronary artery without angina pectoris: Secondary | ICD-10-CM

## 2010-11-06 NOTE — Assessment & Plan Note (Signed)
Summary: F6M/WPA/TMJ   Visit Type:  Follow-up Referring Provider:  Dr Clifton James Primary Provider:  Jarome Matin, MD  CC:  edema in legs and right hand.  History of Present Illness: [name] is a pleasant 77 year old African American female with a past medical history significant for coronary artery disease, status post placement of stents in the circumflex and right coronary arteries in 2005 by Dr. Juanda Chance, as well as hypertension, moderate AS, hyperlipidemia, obesity, and diabetes mellitus and high grade AV block now s/p PPM, who comes in today for routine cardiac followup. She has been doing well. Most recent carotid dopplers showed mild disease bilaterally (0-39% RICA, LICA). She was admitted to [facility] in November 2010  with pre-renal azotemia with hypotension, bradycardia and syncope and had an echo with normal LV function and no wall motion abnormalities. Her AS has been mild. She had a pacemaker placed by Dr. Johney Frame in May 2011 for second degree AV block. She has been feeling much better since her pacemaker was placed. No chest pain or SOB. Mild swelling in left lower extremity, chronic and resolves at night. No near syncope or syncope.   Most recent echo in October 2011 with Normal LV size and function with moderate to severe Aortic stenosis. Recent carotid artery dopplers October 2011 with mild bilateral disease.   Current Medications (verified): 1)  Nitroglycerin 0.4 Mg Subl (Nitroglycerin) .... Place 1 Tablet Under Tongue As Directed 2)  Plavix 75 Mg Tabs (Clopidogrel Bisulfate) .... Take 1 Tablet By Mouth Once A Day 3)  Aspirin 81 Mg Tbec (Aspirin) .... Take One Tablet By Mouth Daily 4)  Carvedilol 6.25 Mg Tabs (Carvedilol) .... Take One Tablet By Mouth Twice A Day 5)  Sensipar 30 Mg Tabs (Cinacalcet Hcl) .... Take 1 Tablet By Mouth Once A Day  Allergies (verified): 1)  ! * Latex  Past History:  Past Medical History: Diabetes  Mellitus Hyperlipidemia Hypertension CAD-s/p NSTEMI 2003 with stent placement OM1, PTCA with cutting ballloon proximal LAD. 2005 admission with chest pain, Bare metal stent placed in Circumflex and Taxus stent placed in RCA. Moderate to severe  Aortic stenosis by echo 10/11 Mild MR Grade I Diastolic Relaxation Abnormality Anemia Recurrent syncope due to symptomatic bradycardia-->s/p PPM CRI remote CVA (recovered) AV block-now s/p PPM May 2011  Social History: Reviewed history from 10/31/2010 and no changes required. Lives in [place] with her sons.  Remote tobacco use-smoked 1/2ppd for 30 years but stopped smoking 20 years ago. No alcohol No illicit drug use Widowed 8 children  Review of Systems       The patient complains of leg swelling.  The patient denies fatigue, malaise, fever, weight gain/loss, vision loss, decreased hearing, hoarseness, chest pain, palpitations, shortness of breath, prolonged cough, wheezing, sleep apnea, coughing up blood, abdominal pain, blood in stool, nausea, vomiting, diarrhea, heartburn, incontinence, blood in urine, muscle weakness, joint pain, rash, skin lesions, headache, fainting, dizziness, depression, anxiety, enlarged lymph nodes, easy bruising or bleeding, and environmental allergies.    Vital Signs:  Patient profile:   77 year old female Height:      68 inches Weight:      238.50 pounds BMI:     36.39 Pulse rate:   65 / minute Resp:     18 per minute BP sitting:   156 / 80  (left arm)  Vitals Entered By: Celestia Khat, CMA (October 31, 2010 10:06 AM)  Physical Exam  General:  General: Well developed, well nourished, NAD  Musculoskeletal: Muscle strength 5/5 all ext Psychiatric: Mood and affect normal Neck: No JVD, no carotid bruits, no thyromegaly, no lymphadenopathy. Lungs:Clear bilaterally, no wheezes, rhonci, crackles CV: RRR with systolic  murmur. No gallops rubs Abdomen: soft, NT, ND, BS present Extremities: No edema, pulses  2+.    PPM Specifications Following MD:  Hillis Range, MD     PPM Vendor:  [employer]     PPM Model Number:  [phone]     PPM Serial Number:  [id number] PPM DOI:  01/10/2010     PPM Implanting MD:  Hillis Range, MD  Lead 1    Location: [facility]     DOI: 01/10/2010     Model #: [id number]     Serial #: [id number]     Status: active Lead 2    Location: [facility]     DOI: 01/10/2010     Model #: [id number]     Serial #: [id number]     Status: active  Magnet Response Rate:  BOL 100 ERI  85  Indications:  Syncope with brady   PPM Follow Up Pacer Dependent:  No      Episodes Coumadin:  No  Parameters Mode:  DDD     Lower Rate Limit:  60     Upper Rate Limit:  110 Paced AV Delay:  200     Sensed AV Delay:  180  Impression & Recommendations:  Problem # 1:  CAD, NATIVE VESSEL (ICD-414.01) Stable. Continue current meds. Will add statin.   Her updated medication list for this problem includes:    Nitroglycerin 0.4 Mg Subl (Nitroglycerin) .Marland Kitchen... Place 1 tablet under tongue as directed    Plavix 75 Mg Tabs (Clopidogrel bisulfate) .Marland Kitchen... Take 1 tablet by mouth once a day    Aspirin 81 Mg Tbec (Aspirin) .Marland Kitchen... Take one tablet by mouth daily    Carvedilol 6.25 Mg Tabs (Carvedilol) .Marland Kitchen... Take one tablet by mouth twice a day    Amlodipine Besylate 5 Mg Tabs (Amlodipine besylate) .Marland Kitchen... Take one tablet by mouth daily  Problem # 2:  AORTIC STENOSIS (ICD-424.1) Moderate to severe by echo in October. Will repeat echo in October. Asymptomatic.   The following medications were removed from the medication list:    Hydrochlorothiazide 25 Mg Tabs (Hydrochlorothiazide) .Marland Kitchen... 1 tab once daily Her updated medication list for this problem includes:    Nitroglycerin 0.4 Mg Subl (Nitroglycerin) .Marland Kitchen... Place 1 tablet under tongue as directed    Carvedilol 6.25 Mg Tabs (Carvedilol) .Marland Kitchen... Take one tablet by mouth twice a day  Problem # 3:  HYPERCHOLESTEROLEMIA (ICD-272.0) Will add Crestor 10 mg by mouth Qdaily. Will check BMET, lipids and  LFTs in 12 weeks.   Problem # 4:  ESSENTIAL HYPERTENSION, BENIGN (ICD-401.1) BP not well controlled. Will add Norvasc 5 mg by mouth Qdaily.   The following medications were removed from the medication list:    Hydrochlorothiazide 25 Mg Tabs (Hydrochlorothiazide) .Marland Kitchen... 1 tab once daily Her updated medication list for this problem includes:    Aspirin 81 Mg Tbec (Aspirin) .Marland Kitchen... Take one tablet by mouth daily    Carvedilol 6.25 Mg Tabs (Carvedilol) .Marland Kitchen... Take one tablet by mouth twice a day  The following medications were removed from the medication list:    Hydrochlorothiazide 25 Mg Tabs (Hydrochlorothiazide) .Marland Kitchen... 1 tab once daily Her updated medication list for this problem includes:    Aspirin 81 Mg Tbec (Aspirin) .Marland Kitchen... Take one tablet by mouth daily    Carvedilol 6.25  Mg Tabs (Carvedilol) .Marland Kitchen... Take one tablet by mouth twice a day  Patient Instructions: 1)  Your physician recommends that you schedule a follow-up appointment in: 6 months with Dr. Clifton James. 2)  Your physician has recommended you make the following change in your medication: START CRESTOR 10mg  by mouth daily and START NORVASC 5mg  by mouth daily.  3)  Your physician recommends that you return for a FASTING lipid profile and liver function test/BMP. Prescriptions: AMLODIPINE BESYLATE 5 MG TABS (AMLODIPINE BESYLATE) Take one tablet by mouth daily  #30 x 8   Entered by:   Whitney Maeola Sarah RN   Authorized by:   Verne Carrow, MD   Signed by:   Ellender Hose RN on 10/31/2010   Method used:   Electronically to        [facility] [phone]* (retail)       [address]       [place], [place]  [address]       Ph: [phone]       Fax: [phone]   RxID:   [id number] CRESTOR 10 MG TABS (ROSUVASTATIN CALCIUM) Take one tablet by mouth daily.  #30 x 8   Entered by:   Whitney Maeola Sarah RN   Authorized by:   Verne Carrow, MD   Signed by:   Ellender Hose RN on 10/31/2010   Method used:   Electronically  to        [facility] [phone]* (retail)       [address]       [place], [place]  [address]       Ph: [phone]       Fax: [phone]   RxID:   [id number]

## 2010-11-12 ENCOUNTER — Ambulatory Visit (HOSPITAL_COMMUNITY): Payer: Medicare Other | Attending: Cardiovascular Disease

## 2010-11-12 DIAGNOSIS — E785 Hyperlipidemia, unspecified: Secondary | ICD-10-CM | POA: Insufficient documentation

## 2010-11-12 DIAGNOSIS — I2510 Atherosclerotic heart disease of native coronary artery without angina pectoris: Secondary | ICD-10-CM | POA: Insufficient documentation

## 2010-11-12 DIAGNOSIS — I10 Essential (primary) hypertension: Secondary | ICD-10-CM | POA: Insufficient documentation

## 2010-11-12 DIAGNOSIS — E669 Obesity, unspecified: Secondary | ICD-10-CM | POA: Insufficient documentation

## 2010-11-12 DIAGNOSIS — F17200 Nicotine dependence, unspecified, uncomplicated: Secondary | ICD-10-CM | POA: Insufficient documentation

## 2010-11-12 DIAGNOSIS — E119 Type 2 diabetes mellitus without complications: Secondary | ICD-10-CM | POA: Insufficient documentation

## 2010-11-12 DIAGNOSIS — I359 Nonrheumatic aortic valve disorder, unspecified: Secondary | ICD-10-CM

## 2010-11-18 LAB — GLUCOSE, CAPILLARY
Glucose-Capillary: 100 mg/dL — ABNORMAL HIGH (ref 70–99)
Glucose-Capillary: 100 mg/dL — ABNORMAL HIGH (ref 70–99)
Glucose-Capillary: 102 mg/dL — ABNORMAL HIGH (ref 70–99)
Glucose-Capillary: 105 mg/dL — ABNORMAL HIGH (ref 70–99)
Glucose-Capillary: 110 mg/dL — ABNORMAL HIGH (ref 70–99)
Glucose-Capillary: 110 mg/dL — ABNORMAL HIGH (ref 70–99)
Glucose-Capillary: 112 mg/dL — ABNORMAL HIGH (ref 70–99)
Glucose-Capillary: 122 mg/dL — ABNORMAL HIGH (ref 70–99)
Glucose-Capillary: 123 mg/dL — ABNORMAL HIGH (ref 70–99)
Glucose-Capillary: 130 mg/dL — ABNORMAL HIGH (ref 70–99)
Glucose-Capillary: 83 mg/dL (ref 70–99)
Glucose-Capillary: 87 mg/dL (ref 70–99)
Glucose-Capillary: 90 mg/dL (ref 70–99)
Glucose-Capillary: 91 mg/dL (ref 70–99)
Glucose-Capillary: 93 mg/dL (ref 70–99)
Glucose-Capillary: 94 mg/dL (ref 70–99)
Glucose-Capillary: 95 mg/dL (ref 70–99)
Glucose-Capillary: 95 mg/dL (ref 70–99)
Glucose-Capillary: 96 mg/dL (ref 70–99)
Glucose-Capillary: 96 mg/dL (ref 70–99)
Glucose-Capillary: 97 mg/dL (ref 70–99)
Glucose-Capillary: 97 mg/dL (ref 70–99)
Glucose-Capillary: 98 mg/dL (ref 70–99)
Glucose-Capillary: 99 mg/dL (ref 70–99)
Glucose-Capillary: 99 mg/dL (ref 70–99)

## 2010-11-18 LAB — CBC
Hemoglobin: 9 g/dL — ABNORMAL LOW (ref 12.0–15.0)
Hemoglobin: 9.2 g/dL — ABNORMAL LOW (ref 12.0–15.0)
MCHC: 33.4 g/dL (ref 30.0–36.0)
MCHC: 33.3 g/dL (ref 30.0–36.0)
MCHC: 34.3 g/dL (ref 30.0–36.0)
MCV: 77.4 fL — ABNORMAL LOW (ref 78.0–100.0)
MCV: 76.6 fL — ABNORMAL LOW (ref 78.0–100.0)
Platelets: 204 10*3/uL (ref 150–400)
Platelets: 146 10*3/uL — ABNORMAL LOW (ref 150–400)
Platelets: 150 10*3/uL (ref 150–400)
RBC: 3.49 MIL/uL — ABNORMAL LOW (ref 3.87–5.11)
RDW: 19.2 % — ABNORMAL HIGH (ref 11.5–15.5)
RDW: 16.4 % — ABNORMAL HIGH (ref 11.5–15.5)
RDW: 17.6 % — ABNORMAL HIGH (ref 11.5–15.5)
WBC: 4 10*3/uL (ref 4.0–10.5)
WBC: 4.2 10*3/uL (ref 4.0–10.5)
WBC: 4.3 10*3/uL (ref 4.0–10.5)

## 2010-11-18 LAB — GLOMERULAR BASEMENT MEMBRANE ANTIBODIES

## 2010-11-18 LAB — COMPREHENSIVE METABOLIC PANEL
ALT: 52 U/L — ABNORMAL HIGH (ref 0–35)
ALT: 55 U/L — ABNORMAL HIGH (ref 0–35)
AST: 68 U/L — ABNORMAL HIGH (ref 0–37)
AST: 98 U/L — ABNORMAL HIGH (ref 0–37)
Albumin: 2.9 g/dL — ABNORMAL LOW (ref 3.5–5.2)
Alkaline Phosphatase: 44 U/L (ref 39–117)
BUN: 52 mg/dL — ABNORMAL HIGH (ref 6–23)
CO2: 22 mEq/L (ref 19–32)
Chloride: 113 mEq/L — ABNORMAL HIGH (ref 96–112)
Chloride: 115 mEq/L — ABNORMAL HIGH (ref 96–112)
GFR calc Af Amer: 12 mL/min — ABNORMAL LOW (ref >60)
GFR calc non Af Amer: 10 mL/min — ABNORMAL LOW (ref >60)
Potassium: 4.4 mEq/L (ref 3.5–5.1)
Sodium: 144 mEq/L (ref 135–145)
Sodium: 145 mEq/L (ref 135–145)
Total Bilirubin: 0.5 mg/dL (ref 0.3–1.2)
Total Bilirubin: 0.7 mg/dL (ref 0.3–1.2)
Total Protein: 6.3 g/dL (ref 6.0–8.3)

## 2010-11-18 LAB — BASIC METABOLIC PANEL
BUN: 63 mg/dL — ABNORMAL HIGH (ref 6–23)
CO2: 28 mEq/L (ref 19–32)
Calcium: 9.7 mg/dL (ref 8.4–10.5)
Chloride: 107 mEq/L (ref 96–112)
Creatinine, Ser: 5.6 mg/dL — ABNORMAL HIGH (ref 0.4–1.2)
Creatinine, Ser: 5.78 mg/dL — ABNORMAL HIGH (ref 0.4–1.2)
GFR calc Af Amer: 9 mL/min — ABNORMAL LOW (ref >60)
GFR calc non Af Amer: 7 mL/min — ABNORMAL LOW (ref >60)
Glucose, Bld: 104 mg/dL — ABNORMAL HIGH (ref 70–99)
Potassium: 3.2 mEq/L — ABNORMAL LOW (ref 3.5–5.1)

## 2010-11-18 LAB — RENAL FUNCTION PANEL
Albumin: 2.8 g/dL — ABNORMAL LOW (ref 3.5–5.2)
Albumin: 2.8 g/dL — ABNORMAL LOW (ref 3.5–5.2)
Calcium: 10.1 mg/dL (ref 8.4–10.5)
Chloride: 116 mEq/L — ABNORMAL HIGH (ref 96–112)
Creatinine, Ser: 4.1 mg/dL — ABNORMAL HIGH (ref 0.4–1.2)
GFR calc Af Amer: 11 mL/min — ABNORMAL LOW (ref >60)
GFR calc Af Amer: 13 mL/min — ABNORMAL LOW (ref >60)
GFR calc non Af Amer: 11 mL/min — ABNORMAL LOW (ref >60)
Glucose, Bld: 100 mg/dL — ABNORMAL HIGH (ref 70–99)
Phosphorus: 3.2 mg/dL (ref 2.3–4.6)
Potassium: 3.4 mEq/L — ABNORMAL LOW (ref 3.5–5.1)
Sodium: 144 mEq/L (ref 135–145)

## 2010-11-18 LAB — BASIC METABOLIC PANEL WITH GFR
BUN: 58 mg/dL — ABNORMAL HIGH (ref 6–23)
CO2: 23 meq/L (ref 19–32)
Calcium: 9.1 mg/dL (ref 8.4–10.5)
Glucose, Bld: 87 mg/dL (ref 70–99)
Sodium: 143 meq/L (ref 135–145)

## 2010-11-18 LAB — CARDIAC PANEL(CRET KIN+CKTOT+MB+TROPI)
CK, MB: 12.1 ng/mL (ref 0.3–4.0)
Relative Index: 0.5 (ref 0.0–2.5)
Total CK: 2450 U/L — ABNORMAL HIGH (ref 7–177)
Troponin I: 0.19 ng/mL — ABNORMAL HIGH (ref 0.00–0.06)

## 2010-11-18 LAB — IRON AND TIBC
Saturation Ratios: 19 % — ABNORMAL LOW (ref 20–55)
TIBC: 233 ug/dL — ABNORMAL LOW (ref 250–470)

## 2010-11-18 LAB — SURGICAL PCR SCREEN: Staphylococcus aureus: NEGATIVE

## 2010-11-18 LAB — FERRITIN: Ferritin: 1019 ng/mL — ABNORMAL HIGH (ref 10–291)

## 2010-11-18 LAB — PTH, INTACT AND CALCIUM: PTH: 201.9 pg/mL — ABNORMAL HIGH (ref 14.0–72.0)

## 2010-11-18 LAB — CK: Total CK: 2781 U/L — ABNORMAL HIGH (ref 7–177)

## 2010-11-19 LAB — PROTEIN ELECTROPHORESIS, SERUM
Albumin ELP: 50.2 % — ABNORMAL LOW (ref 55.8–66.1)
Alpha-1-Globulin: 7.6 % — ABNORMAL HIGH (ref 2.9–4.9)
Alpha-2-Globulin: 14.7 % — ABNORMAL HIGH (ref 7.1–11.8)
Beta 2: 7.7 % — ABNORMAL HIGH (ref 3.2–6.5)
Beta Globulin: 5.1 % (ref 4.7–7.2)
Gamma Globulin: 14.7 % (ref 11.1–18.8)
M-Spike, %: NOT DETECTED g/dL
Total Protein ELP: 7.4 g/dL (ref 6.0–8.3)

## 2010-11-19 LAB — BASIC METABOLIC PANEL
CO2: 29 mEq/L (ref 19–32)
Calcium: 10.2 mg/dL (ref 8.4–10.5)
Chloride: 93 mEq/L — ABNORMAL LOW (ref 96–112)
Glucose, Bld: 98 mg/dL (ref 70–99)
Potassium: 2.4 mEq/L — CL (ref 3.5–5.1)
Sodium: 137 mEq/L (ref 135–145)

## 2010-11-19 LAB — DIFFERENTIAL
Basophils Relative: 1 % (ref 0–1)
Eosinophils Absolute: 0 10*3/uL (ref 0.0–0.7)
Lymphs Abs: 1.8 10*3/uL (ref 0.7–4.0)
Neutro Abs: 3.7 10*3/uL (ref 1.7–7.7)
Neutrophils Relative %: 62 % (ref 43–77)

## 2010-11-19 LAB — HEMOGLOBIN A1C
Hgb A1c MFr Bld: 6.7 % — ABNORMAL HIGH (ref <5.7)
Mean Plasma Glucose: 146 mg/dL — ABNORMAL HIGH (ref <117)

## 2010-11-19 LAB — HEPATIC FUNCTION PANEL
ALT: 62 U/L — ABNORMAL HIGH (ref 0–35)
AST: 116 U/L — ABNORMAL HIGH (ref 0–37)
Albumin: 3.7 g/dL (ref 3.5–5.2)
Alkaline Phosphatase: 57 U/L (ref 39–117)
Bilirubin, Direct: 0.2 mg/dL (ref 0.0–0.3)
Indirect Bilirubin: 0.5 mg/dL (ref 0.3–0.9)
Total Bilirubin: 0.7 mg/dL (ref 0.3–1.2)
Total Protein: 7.9 g/dL (ref 6.0–8.3)

## 2010-11-19 LAB — URINALYSIS, ROUTINE W REFLEX MICROSCOPIC
Glucose, UA: NEGATIVE mg/dL
Protein, ur: 100 mg/dL — AB
Urobilinogen, UA: 0.2 mg/dL (ref 0.0–1.0)

## 2010-11-19 LAB — CBC
MCV: 77.3 fL — ABNORMAL LOW (ref 78.0–100.0)
Platelets: 194 10*3/uL (ref 150–400)
WBC: 6 10*3/uL (ref 4.0–10.5)

## 2010-11-19 LAB — ANA: Anti Nuclear Antibody(ANA): NEGATIVE

## 2010-11-19 LAB — URINE CULTURE: Colony Count: 3000

## 2010-11-19 LAB — URINE MICROSCOPIC-ADD ON

## 2010-11-19 LAB — SEDIMENTATION RATE: Sed Rate: 102 mm/h — ABNORMAL HIGH (ref 0–22)

## 2010-11-19 LAB — ANTI-NEUTROPHIL ANTIBODY

## 2010-11-19 LAB — MAGNESIUM: Magnesium: 2.3 mg/dL (ref 1.5–2.5)

## 2010-11-19 LAB — CK: Total CK: 3333 U/L — ABNORMAL HIGH (ref 7–177)

## 2010-12-03 LAB — URINE CULTURE: Culture: NO GROWTH

## 2010-12-03 LAB — BASIC METABOLIC PANEL
BUN: 22 mg/dL (ref 6–23)
BUN: 36 mg/dL — ABNORMAL HIGH (ref 6–23)
BUN: 37 mg/dL — ABNORMAL HIGH (ref 6–23)
BUN: 41 mg/dL — ABNORMAL HIGH (ref 6–23)
CO2: 24 mEq/L (ref 19–32)
CO2: 28 mEq/L (ref 19–32)
Calcium: 10 mg/dL (ref 8.4–10.5)
Calcium: 10 mg/dL (ref 8.4–10.5)
Calcium: 10 mg/dL (ref 8.4–10.5)
Calcium: 9.6 mg/dL (ref 8.4–10.5)
Calcium: 9.9 mg/dL (ref 8.4–10.5)
Chloride: 100 mEq/L (ref 96–112)
Chloride: 99 mEq/L (ref 96–112)
Creatinine, Ser: 2.25 mg/dL — ABNORMAL HIGH (ref 0.4–1.2)
Creatinine, Ser: 2.55 mg/dL — ABNORMAL HIGH (ref 0.4–1.2)
Creatinine, Ser: 2.97 mg/dL — ABNORMAL HIGH (ref 0.4–1.2)
GFR calc Af Amer: 19 mL/min — ABNORMAL LOW (ref >60)
GFR calc Af Amer: 22 mL/min — ABNORMAL LOW (ref >60)
GFR calc Af Amer: 26 mL/min — ABNORMAL LOW (ref >60)
GFR calc Af Amer: 35 mL/min — ABNORMAL LOW (ref >60)
GFR calc non Af Amer: 15 mL/min — ABNORMAL LOW (ref >60)
GFR calc non Af Amer: 17 mL/min — ABNORMAL LOW (ref >60)
GFR calc non Af Amer: 18 mL/min — ABNORMAL LOW (ref >60)
GFR calc non Af Amer: 21 mL/min — ABNORMAL LOW (ref >60)
GFR calc non Af Amer: 29 mL/min — ABNORMAL LOW (ref >60)
Glucose, Bld: 105 mg/dL — ABNORMAL HIGH (ref 70–99)
Glucose, Bld: 109 mg/dL — ABNORMAL HIGH (ref 70–99)
Potassium: 2.8 mEq/L — ABNORMAL LOW (ref 3.5–5.1)
Sodium: 139 mEq/L (ref 135–145)
Sodium: 140 mEq/L (ref 135–145)
Sodium: 143 mEq/L (ref 135–145)
Sodium: 145 mEq/L (ref 135–145)

## 2010-12-03 LAB — GLUCOSE, CAPILLARY
Glucose-Capillary: 101 mg/dL — ABNORMAL HIGH (ref 70–99)
Glucose-Capillary: 108 mg/dL — ABNORMAL HIGH (ref 70–99)
Glucose-Capillary: 112 mg/dL — ABNORMAL HIGH (ref 70–99)
Glucose-Capillary: 123 mg/dL — ABNORMAL HIGH (ref 70–99)
Glucose-Capillary: 151 mg/dL — ABNORMAL HIGH (ref 70–99)
Glucose-Capillary: 88 mg/dL (ref 70–99)
Glucose-Capillary: 89 mg/dL (ref 70–99)
Glucose-Capillary: 92 mg/dL (ref 70–99)
Glucose-Capillary: 94 mg/dL (ref 70–99)
Glucose-Capillary: 96 mg/dL (ref 70–99)
Glucose-Capillary: 98 mg/dL (ref 70–99)

## 2010-12-03 LAB — URINALYSIS, ROUTINE W REFLEX MICROSCOPIC
Glucose, UA: NEGATIVE mg/dL
Leukocytes, UA: NEGATIVE
Nitrite: NEGATIVE
Specific Gravity, Urine: 1.015 (ref 1.005–1.030)
pH: 5.5 (ref 5.0–8.0)

## 2010-12-03 LAB — CBC
HCT: 27.6 % — ABNORMAL LOW (ref 36.0–46.0)
HCT: 33 % — ABNORMAL LOW (ref 36.0–46.0)
Hemoglobin: 10.4 g/dL — ABNORMAL LOW (ref 12.0–15.0)
Hemoglobin: 8.6 g/dL — ABNORMAL LOW (ref 12.0–15.0)
Hemoglobin: 9.2 g/dL — ABNORMAL LOW (ref 12.0–15.0)
Hemoglobin: 9.5 g/dL — ABNORMAL LOW (ref 12.0–15.0)
MCHC: 33.3 g/dL (ref 30.0–36.0)
MCHC: 33.3 g/dL (ref 30.0–36.0)
MCHC: 33.7 g/dL (ref 30.0–36.0)
MCHC: 33.9 g/dL (ref 30.0–36.0)
MCV: 79.4 fL (ref 78.0–100.0)
MCV: 80.4 fL (ref 78.0–100.0)
Platelets: 149 10*3/uL — ABNORMAL LOW (ref 150–400)
Platelets: 170 10*3/uL (ref 150–400)
Platelets: 183 10*3/uL (ref 150–400)
RBC: 3.24 MIL/uL — ABNORMAL LOW (ref 3.87–5.11)
RBC: 3.52 MIL/uL — ABNORMAL LOW (ref 3.87–5.11)
RBC: 3.85 MIL/uL — ABNORMAL LOW (ref 3.87–5.11)
RBC: 4.38 MIL/uL (ref 3.87–5.11)
RDW: 15.5 % (ref 11.5–15.5)
RDW: 16 % — ABNORMAL HIGH (ref 11.5–15.5)
WBC: 4.7 10*3/uL (ref 4.0–10.5)
WBC: 5.4 10*3/uL (ref 4.0–10.5)

## 2010-12-03 LAB — POCT I-STAT, CHEM 8
Calcium, Ion: 1.18 mmol/L (ref 1.12–1.32)
Glucose, Bld: 121 mg/dL — ABNORMAL HIGH (ref 70–99)
HCT: 36 % (ref 36.0–46.0)
Hemoglobin: 12.2 g/dL (ref 12.0–15.0)
Potassium: 2 mEq/L — CL (ref 3.5–5.1)
TCO2: 35 mmol/L (ref 0–100)

## 2010-12-03 LAB — COMPREHENSIVE METABOLIC PANEL
AST: 79 U/L — ABNORMAL HIGH (ref 0–37)
Albumin: 3.4 g/dL — ABNORMAL LOW (ref 3.5–5.2)
BUN: 39 mg/dL — ABNORMAL HIGH (ref 6–23)
Calcium: 10.1 mg/dL (ref 8.4–10.5)
Creatinine, Ser: 2.75 mg/dL — ABNORMAL HIGH (ref 0.4–1.2)
GFR calc Af Amer: 20 mL/min — ABNORMAL LOW (ref >60)
GFR calc non Af Amer: 17 mL/min — ABNORMAL LOW (ref >60)
Total Bilirubin: 1 mg/dL (ref 0.3–1.2)

## 2010-12-03 LAB — CARDIAC PANEL(CRET KIN+CKTOT+MB+TROPI)
Total CK: 1036 U/L — ABNORMAL HIGH (ref 7–177)
Total CK: 875 U/L — ABNORMAL HIGH (ref 7–177)
Total CK: 929 U/L — ABNORMAL HIGH (ref 7–177)
Troponin I: 0.17 ng/mL — ABNORMAL HIGH (ref 0.00–0.06)
Troponin I: 0.2 ng/mL — ABNORMAL HIGH (ref 0.00–0.06)
Troponin I: 0.21 ng/mL — ABNORMAL HIGH (ref 0.00–0.06)

## 2010-12-03 LAB — HEMOGLOBIN A1C: Mean Plasma Glucose: 131 mg/dL

## 2010-12-03 LAB — DIFFERENTIAL
Basophils Relative: 0 % (ref 0–1)
Eosinophils Absolute: 0 10*3/uL (ref 0.0–0.7)
Eosinophils Relative: 0 % (ref 0–5)
Monocytes Relative: 8 % (ref 3–12)
Neutrophils Relative %: 59 % (ref 43–77)

## 2010-12-03 LAB — HEPARIN LEVEL (UNFRACTIONATED)
Heparin Unfractionated: 0.86 IU/mL — ABNORMAL HIGH (ref 0.30–0.70)
Heparin Unfractionated: 1.22 IU/mL — ABNORMAL HIGH (ref 0.30–0.70)
Heparin Unfractionated: 1.58 IU/mL — ABNORMAL HIGH (ref 0.30–0.70)

## 2010-12-03 LAB — LIPID PANEL
HDL: 45 mg/dL (ref >39)
Total CHOL/HDL Ratio: 4.3 RATIO
VLDL: 25 mg/dL (ref 0–40)

## 2010-12-03 LAB — IRON AND TIBC
Saturation Ratios: 26 % (ref 20–55)
TIBC: 220 ug/dL — ABNORMAL LOW (ref 250–470)

## 2010-12-03 LAB — TSH: TSH: 0.515 u[IU]/mL (ref 0.350–4.500)

## 2010-12-03 LAB — URINE MICROSCOPIC-ADD ON

## 2010-12-03 LAB — FERRITIN: Ferritin: 768 ng/mL — ABNORMAL HIGH (ref 10–291)

## 2010-12-03 LAB — CREATININE, URINE, RANDOM: Creatinine, Urine: 83.1 mg/dL

## 2010-12-03 LAB — POCT CARDIAC MARKERS: Myoglobin, poc: >500 ng/mL (ref 12–200)

## 2010-12-03 LAB — BRAIN NATRIURETIC PEPTIDE: Pro B Natriuretic peptide (BNP): 181 pg/mL — ABNORMAL HIGH (ref 0.0–100.0)

## 2010-12-03 LAB — MAGNESIUM: Magnesium: 3 mg/dL — ABNORMAL HIGH (ref 1.5–2.5)

## 2011-01-13 NOTE — Assessment & Plan Note (Signed)
[facility]                            CARDIOLOGY OFFICE NOTE   ANADALAY, MACDONELL                        MRN:          [id number]  DATE:02/02/2008                            DOB:          1929/12/15    PRIMARY CARE PHYSICIAN:  Dr. Jarome Matin.   REASON FOR VISIT:  Routine followup.   HISTORY OF PRESENT ILLNESS:  Ms. Lukens comes back in for a 48-month  visit.  She is not reporting any significant angina.  She still gets out  in her yard to do some gardening and has NYHA class II dyspnea on  exertion.  She has been bothered by arthritic knee discomfort and also  some paresthesias in her right leg which she states has been there since  her knee replacement.  Her electrocardiogram today shows sinus  bradycardia with a prolonged PR interval of 230 ms, otherwise borderline  criteria for left ventricular hypertrophy.  She is now on amlodipine  which was recently added for better blood pressure control by Dr.  Eloise Harman.  She had followup lipids and liver function tests back in  March.  Liver tests were normal.  She has continued improvement in her  LDL down to 130.  She originally started out up in the 260s, and has  responded to increasing doses of Crestor.  She is tolerating this  medicine well.  Her last echocardiogram to follow up aortic stenosis was  back in December of 2007.  She is not having any palpitations or  syncope.   ALLERGIES:  LATEX.   PRESENT MEDICATIONS:  1. Plavix 75 mg p.o. daily.  2. Atenolol 50 mg p.o. daily.  3. Enteric-coated aspirin 81 mg p.o. daily.  4. Hydrochlorothiazide 25 mg p.o. daily.  5. Crestor 20 mg p.o. daily.  6. Amlodipine 2.5 mg p.o. daily.  7. Metformin ER 500 mg 2 tablets p.o. daily.   REVIEW OF SYSTEMS:  As described in the history of present illness,  otherwise, negative.   PHYSICAL EXAMINATION:  Blood pressure 160/80, heart rate is 59, weight  is 252 pounds which is stable.  This is an overweight  woman in no acute distress.  HEENT:  Conjunctivae are normal, oropharynx clear.  NECK:  Supple no elevated jugular venous pressure, no loud bruits.  LUNGS:  Clear, diminished breath sounds.  CARDIAC EXAM:  Reveals a regular rate and rhythm with a 3/6 systolic  murmur heard best at the base consistent with aortic insufficiency.  Second heart sound is preserved.  No S3 gallop.  ABDOMEN:  Obese, nontender, normoactive bowel sounds.  EXTREMITIES:  Exhibit trace pitting edema.  Distal pulses are 2+.  SKIN:  Warm and dry.  MUSCULOSKELETAL:  No kyphosis is noted.  NEURO/PSYCHIATRIC:  Patient is alert and oriented x3.  Affect is  appropriate.   IMPRESSION/RECOMMENDATIONS:  1. History of multivessel coronary artery disease with preserved      ejection fraction, status post non-ST elevation myocardial      infarction in the past with multiple percutaneous interventions,      most recently in 2005 by Dr. Juanda Chance at  which time the patient      underwent stent placement to the circumflex and the right coronary      arteries.  She is doing well as far as symptom control on medical      therapy and had a nonischemic Cardiolite within the last 3 years.      Will plan to continue medical therapy and I will have her follow up      over the next 6 months.  2. History of mild aortic stenosis documented in December of 2007.      Mean transvalvular gradient was 14 mmHg at that time.  A followup      echocardiogram will be obtained.  3. Hypertension.  I agree with the addition of Norvasc.  Would titrate      this for optimal control.  4. Hyperlipidemia with continuing improvement in LDL although not      optimal.  Crestor will be increased to 40 mg daily, and she will      have followup lipids and liver function tests over the next 3      months.     Jonelle Sidle, MD  Electronically Signed    SGM/MedQ  DD: 02/02/2008  DT: 02/02/2008  Job #: [id number]   cc:   Barry Dienes. Eloise Harman, M.D.

## 2011-01-13 NOTE — Assessment & Plan Note (Signed)
[facility]                            CARDIOLOGY OFFICE NOTE   [name], [name]                      MRN:          [id number]  DATE:07/31/2008                            DOB:          [birth year]    PRIMARY CARE PHYSICIAN:  Barry Dienes. Eloise Harman, MD   REASON FOR VISIT:  Routine cardiac followup.   HISTORY OF PRESENT ILLNESS:  [name] is a pleasant 77 year old  African American female with a past medical history significant for  coronary artery disease, status post placement of stents in the  circumflex and right coronary arteries in 2005 by Dr. Juanda Chance, as well as  hypertension, hyperlipidemia, obesity, and diabetes mellitus, who comes  in today for routine cardiac followup.  The patient has been previously  followed in this office by Dr. Nona Dell, who is no longer seeing  patients in this clinic.  I am meeting her for the first time today.  She tells me that she has been doing very well.  She did have 1 episode  of chest discomfort about 3 weeks ago, which she felt was secondary to  heartburn.  She took some Mylanta and had complete relief of her  symptoms.  She has been very active around her house, in her yard, and  has not been limited by any symptoms that are suggestive of angina,  arrhythmias, or congestive heart failure.  She denies any chest pain  with exertion, dyspnea with exertion, palpitations, dizziness, near  syncope, syncope, orthopnea, PND, or lower extremity edema.  She tells  me that she actually cooked Thanksgiving dinner for 45 people last week.  She does still have some complaints of numbness and tingling in her  right leg after her knee replacement.  Her cholesterol has been followed  in her primary care physician's office.  She tells me that she had this  checked recently.  She has been tolerating all of her medications well  and has been taking them as prescribed.  She did have a followup  echocardiogram in June  2009 to evaluate her aortic stenosis.  There was  no significant change in the gradient across the aortic valve and no  change in her left ventricular systolic function at that time.   PAST MEDICAL HISTORY:  Significant for hypertension, hyperlipidemia,  diabetes mellitus, obesity, and coronary artery disease with non-ST-  elevation myocardial infarction in 2003 at which time she had a stent  placed in the first obtuse marginal coronary artery as well as a cutting  balloon angioplasty in the proximal left anterior descending coronary  artery.  The patient had some recurrent chest pain in 2005 and had  another heart catheterization with placement of a bare-metal stent in  the circumflex coronary artery and a Taxus stent in the right coronary  artery.  She has done well with no further ischemic problems since that  time.   PAST SURGICAL HISTORY:  1. Hysterectomy.  2. Benign vocal cord tumor removal.   ALLERGIES:  LATEX.   CURRENT MEDICATIONS:  1. Plavix 75 mg once daily.  2. Enteric-coated aspirin 81  mg once daily.  3. Hydrochlorothiazide 25 mg once daily.  4. Amlodipine 2.5 mg once daily.  5. Crestor 40 mg once daily.  6. Atenolol 50 mg twice daily.  7. Metformin 500 mg once daily.   SOCIAL HISTORY:  The patient smoked one-half-pack of cigarettes per day  for approximately 30 years, but stopped smoking 20 years ago.  She  denies use of alcohol or illicit drugs.  She is widowed and tells me  that her husband died 18 years ago.  She has 8 children.   REVIEW OF SYSTEMS:  As stated in the history present illness and is  otherwise negative.   PHYSICAL EXAMINATION:  VITALS:  Blood pressure 134/76, pulse 68 and  regular, and respirations 12 and unlabored.  GENERAL:  She is a pleasant, obese, elderly, African American female in  no acute distress.  She is alert and oriented x3.  PSYCHIATRIC:  Mood and affect are normal.  SKIN:  Warm and dry.  NEUROLOGICAL:  No focal neurological  deficits.  MUSCULOSKELETAL:  Muscle strength and tone is normal.  HEENT:  Normal.  NECK:  No JVD.  There is a bruit heard over the left carotid artery.  There is no lymphadenopathy or thyromegaly.  LUNGS:  Clear to auscultation bilaterally without wheezes, rhonchi, or  crackles noted.  CARDIOVASCULAR:  Regular rate and rhythm with a 3/6  systolic murmur heard at the base.  There are no gallops or rubs noted.  ABDOMEN:  Obese, soft, and nontender.  Bowel sounds are present.  EXTREMITIES:  No edema.  Distal pulses are 2+.   ASSESSMENT AND PLAN:  This is a pleasant 77 year old African American  female with known coronary artery disease, hypertension, hyperlipidemia,  diabetes mellitus, and obesity, who presents today for routine cardiac  followup.  She is without any complaints at this time and has no signs  or symptoms that are suggestive of angina, arrhythmias, or congestive  heart failure.  The patient remained active around her home, and in her  yard, and is not limited by any chest pain or shortness of breath.  She  does tell me that she becomes fatigued after exerting herself and when  this happens she sits down to rest and feels much better.  Her blood  pressure is well controlled at the current time on the medications as  written.  I do not see any reason to make any medication changes.  I  would like to continue her on aspirin and Plavix as long as she  tolerates this therapy.  Her aortic stenosis seems to be stable as  documented by echocardiogram in June 2009.  She tells me that her  hyperlipidemia is followed in her primary care physician's office and  has been well controlled.  I do not have laboratory values today to  confirm this.   The patient does have a carotid bruit present on examination.  I would  like to proceed with bilateral carotid Dopplers to assess her carotid  arteries.  We will call the patient with the results of this test. We  will plan on seeing her back  in this office in 6 months for cardiac  followup.  I  have encouraged her to continue to watch her diet to remain active and  to follow up with her primary care physician as needed for her primary  care needs.     [name] Carrow, MD  Electronically Signed    CM/MedQ  DD: 07/31/2008  DT: 07/31/2008  Job #: [id number]   cc:   Barry Dienes. Eloise Harman, M.D.

## 2011-01-13 NOTE — Assessment & Plan Note (Signed)
[facility]                            CARDIOLOGY OFFICE NOTE   [name], [name]                        MRN:          [id number]  DATE:08/15/2007                            DOB:          Aug 07, 1930    PRIMARY CARE PHYSICIAN:  Dr. Jarome Matin.   REASON FOR VISIT:  Cardiology followup.   HISTORY OF PRESENT ILLNESS:  [name] comes in for a 58-month visit.  She is overall doing fairly well.  She states that she is able to take  care of all of her activities of daily living, although somewhat slower  than she did in the past.  She is not having any problems with angina.  Electrocardiogram shows sinus rhythm with voltage criteria for left  ventricular hypertrophy, decreased R wave progression, and nonspecific  ST-T wave changes.  No marked changes are noted in comparison to  previous tracing from May.  She did have lipids obtained recently  showing an improvement in her total cholesterol from 354 down to 254.  HDL 50 up to 73, and LDL of 267, down to 166.  This was on 5 mg of  Crestor.  We asked her to increase this to 20 mg and she will have blood  work due over the next several weeks.   ALLERGIES:  LATEX.   PRESENT MEDICATIONS:  1. Plavix 75 mg p.o. daily.  2. Atenolol 50 mg p.o. daily.  3. Aspirin 81 mg p.o. daily.  4. Hydrochlorothiazide 25 mg p.o. daily.  5. Crestor 20 mg p.o. daily.  6. Tylenol p.r.n.  7. Sublingual nitroglycerin p.r.n.   REVIEW OF SYSTEMS:  As described in the history of present illness.  She  does have some pain occasionally behind her right ankle that is chronic  and intermittent.  No change in lower extremity edema.  No orthopnea or  PND.   EXAMINATION:  Blood pressure is 154/78, heart rate is 62, weight is 251  pounds, which is down 4 pounds from the last visit.  The patient is comfortable in no acute distress.  Examination of the neck reveals no elevated jugular venous pressure.  No  loud bruits.  LUNGS:   Clear.  No labored breathing.  CARDIAC:  Regular rate and rhythm with a 3/6 systolic murmur heard at  the right base, consistent with known aortic stenosis.  EXTREMITIES:  Show chronic-appearing edema, nonpitting.   IMPRESSION/RECOMMENDATIONS:  1. Multivessel cardiovascular disease with preserved ejection fraction      status post previous non-ST-elevation myocardial infarction with      multiple percutaneous interventions.  The patient is stable at this      point, and our plan will be to continue medical therapy with a 6-      month symptom review.  She had a reassuring Cardiolite      approximately 2 years ago.  2. Elevated blood pressure today.  Consideration could be given to the      addition of an ACE inhibitor if this trend continues.  3. Mild aortic stenosis by echocardiogram in December 2007.  Mean  gradient was 14 mmHg.  4. Hyperlipidemia.  The patient will continue Crestor at 20 mg daily      and will have followup liver function tests with lipids in early      March.     Jonelle Sidle, MD  Electronically Signed    SGM/MedQ  DD: 08/15/2007  DT: 08/15/2007  Job #: [id number]   cc:   Barry Dienes. Eloise Harman, M.D.

## 2011-01-13 NOTE — Assessment & Plan Note (Signed)
[facility]                            CARDIOLOGY OFFICE NOTE   [name], [name]                        MRN:          [id number]  DATE:01/31/2007                            DOB:          09/17/1929    REASON FOR VISIT:  Cardiac followup.   HISTORY OF PRESENT ILLNESS:  [name] is doing well with good symptom  control, since her last visit.  She denies having any significant  angina.  She has stable dyspnea on exertion.  We had been adjusting her  lipid regiment and went from Zocor to Vytorin, although this was not  covered by her insurance plan.  She states that she has not been on any  specific lipid medications at this point.  We talked about trying to re-  institute Crestor, which she had tolerated in the past.  Her most recent  LDL was 120 back in December, and at that time she had a normal AST and  ALT.  We talked about a heart healthy diet today as well.  Electrocardiogram shows sinus bradycardia at 58 beats per minute, with  non-specific ST-T wave changes as noted previously.   ALLERGIES:  LATEX.   PRESENT MEDICATIONS:  1. Plavix 75 mg p.o. q.d.  2. Atenolol 50 mg p.o. q.d.  3. Aspirin 81 mg p.o. q.d.  4. Hydrochlorothiazide 25 mg p.o. q.d.  5. Potassium 10 mEq p.o. q.d.   REVIEW OF SYSTEMS:  As described in history of present illness.   EXAMINATION:  VITAL SIGNS:  Blood pressure 137/88, heart rate 54.  Weight is 255 pounds.  Patient is comfortable, in no acute distress.  HEENT:  Conjunctivae is normal.  Pharynx is clear.  NECK:  Supple.  No elevated jugular venous pressure, without bruits.  No  thyromegaly is noted.  LUNGS:  Clear without labored breathing.  CARDIAC:  Exam is a regular rate and rhythm with a 3/6 systolic murmur  heard at the right base, consistent with aortic stenosis.  No S3 gallop.  ABDOMEN:  Soft, nontender.  EXTREMITIES:  Showed no pitting edema.  SKIN:  Warm and dry.   IMPRESSION:  1. Multi-vessel  coronary disease with preserved ejection fraction,      status post non-ST elevation myocardial infarction with multiple      percutaneous interventions.  She is stable symptomatically and had      a reassuring followup study in December 2006.  We will continue      present regimen and plan to see her back over the next 6 months.  2. Mild aortic stenosis.  Based on recent follow-up echocardiogram      from December 2007.  Ejection fraction was 60% at that time.  She      had moderately calcified aortic valve with reduced cusp excursion,      but a mean gradient of only 14 mmHg.  3. Hyperlipidemia, most recent LDL 120 in December.  Will plan to      adjust her medical regiment to Crestor 10 mg daily.  She will have      a follow-up  liver function and lipid test in 8 weeks.     Jonelle Sidle, MD  Electronically Signed    SGM/MedQ  DD: 01/31/2007  DT: 01/31/2007  Job #: [id number]   cc:   Barry Dienes. Eloise Harman, M.D.

## 2011-01-16 NOTE — Consult Note (Signed)
NAMEMARK, BENECKE               ACCOUNT NO.:  [id number]   MEDICAL RECORD NO.:  [id number]          PATIENT TYPE:  INP   LOCATION:  1513                         FACILITY:  [facility]   PHYSICIAN:  Barry Dienes. Eloise Harman, M.D.DATE OF BIRTH:  1930-05-03   DATE OF CONSULTATION:  09/16/2005  DATE OF DISCHARGE:                                   CONSULTATION   REQUESTING PHYSICIAN:  Ollen Gross, M.D.   INDICATION FOR CONSULTATION:  Management of multiple medical problems.   HISTORY OF PRESENT ILLNESS:  The patient is a 77 year old black female who  is well-known to me. She is status post right knee resection arthroplasty  with antibiotic spacer placement today. Currently she is somewhat sleepy and  has a  dry cough without shortness of breath. She also complains of chronic  constipation and moderate right-sided knee pain. She denies abdominal pain,  nausea, vomiting, or dysuria.   PAST MEDICAL HISTORY:  Hypertension, December 2001 pancreatitis, coronary  artery disease status post 2003 non-Q-wave myocardial infarction, diabetes  mellitus, type 2, hyperlipidemia, headaches with increased erythrocyte  sedimentation rate and temporal artery biopsy normal, osteoarthritis of the  knees, July 2006 episode of syncope with subsequent cardiac evaluation  negative, gastroesophageal reflux disease and mild diverticulosis, chronic  anemia with low MCV with July 2004 colonoscopy showing internal and external  hemorrhoids and occasional diverticulitis.   MEDICATIONS PRIOR TO ADMISSION:  1.  Atenolol 50 mg p.o. daily.  2.  Zocor 40 mg p.o. daily.  3.  Nitroglycerin 0.4 mg sublingual p.r.n. chest pain.  4.  Amaryl 2 mg p.o. daily.  5.  Aspirin 81 mg daily.  6.  Plavix 75 mg daily.  7.  Vicodin as needed for pain.  8.  Senna as needed for constipation.   ALLERGIES:  LATEX.  ACE inhibitors have caused a cough.   PAST SURGICAL HISTORY:  Bilateral tubal ligation, total abdominal  hysterectomy and  bilateral salpingo-oophorectomy, vocal cord polyp resection  1997, right total knee replacement 2003, left anterior descending artery  atherectomy and circumflex artery PTCA and stent, 2004 temporal artery  biopsy, August 2005 PTCA procedure with stenting of the circumflex and right  coronary artery and subsequent laser treatment to the right eye.   FAMILY HISTORY:  Father died at age 69 of complications of diabetes  mellitus, type 2. Mother died at age 71 of acute renal failure. There is no  family history of close relatives with early heart disease, colon cancer or  breast cancer.   SOCIAL HISTORY:  She is a widow and is retired with no history of recent  tobacco use or alcohol abuse. She quit smoking in 1980.   REVIEW OF SYSTEMS:  Positive for fever today, chronic cough, and chronic  constipation. She denies shortness of breath, substernal chest pain, nausea,  vomiting, diarrhea, dysuria, frequency, anxiety, or depression.   CURRENT MEDICATIONS:  Coumadin 1 tablet daily, Colace 100 mg p.o. b.i.d.,  iron sulfate 325 mg p.o. t.i.d., Tylenol p.r.n., D5 1/2 normal saline with  20 mEq of potassium per liter at 100 mL per hour, status post intraoperative  Ancef treatment. Robaxin p.r.n. muscle spasms. Patient controlled anesthesia  IV as needed, Fleet's enema p.r.n. constipation.   PHYSICAL EXAM:  VITAL SIGNS:  Temperature 103.3 degrees Fahrenheit, pulse  67, respirations 20, blood pressure 121/61.  GENERAL:  She is a mildly overweight black female who is sleepy with a dry  cough and no shortness of breath.  HEENT:  Exam was within normal limits.  NECK:  Supple and without jugular venous distension or carotid bruit.  CHEST:  Clear to auscultation.  HEART:  Had a regular rate and rhythm with a systolic ejection murmur, grade  2/6 to the left sternal border.  ABDOMEN:  Benign.  EXTREMITIES:  Had bilateral 1+ leg edema.  NEUROLOGICAL:  She is sleepy but easily arousable and answers  questions  appropriately.   LABORATORY STUDIES:  White blood cell count 5.4, hemoglobin 9.3, hematocrit  29.3, platelets 335, MCV 68. Serum sodium 137, potassium 3.5, chloride 105,  CO2 26, BUN 13, creatinine 0.9, glucose 103, total protein 8.0, albumin 2.6.  January 15 chest x-ray showed no acute cardiopulmonary disease, June 25, 2005 whole body bone scan showed positive uptake in the right knee joint  __________ and all subsequent images consistent with osteomyelitis. EKG  showed:  1.  Sinus bradycardia.  2.  Nonspecific T-wave abnormalities.   IMPRESSION AND PLAN:  1.  Fever:  This is likely noninfectious etiology and related to the      orthopedic surgery. A viral syndrome is also possible given the      relatively low white blood cell count. I agree with plans for repeat      chest x-ray, CBC, urinalysis, and urine culture. We will continue the      scheduled _________of patient controlled anesthesia.  2.  Constipation:  Mild and chronic and secondary to medications. Plan on      adding MiraLax 17 grams daily to her regimen and Milk of Magnesia 30 mL      p.o. tonight.  3.  History of diabetes mellitus, type 2:  Stable prior to admission with      last hemoglobin A1c level 6.2% in September 2006. I planned to add      NovoLog sliding scale insulin a.c. and q.h.s. as needed.  4.  Coronary artery disease:  Stable on current medications.  5.  Anemia:  Chronic with a 2004 colonoscopy relatively unremarkable. In the      next few days, we will try to contact her GI physician to see whether or      not a EGD had been done and if so at what time.           ______________________________  Barry Dienes Eloise Harman, M.D.     DGP/MEDQ  D:  09/16/2005  T:  09/17/2005  Job:  [id number]

## 2011-01-16 NOTE — H&P (Signed)
NAMEBINDU, DOCTER               ACCOUNT NO.:  [id number]   MEDICAL RECORD NO.:  [id number]          PATIENT TYPE:  INP   LOCATION:  1516                         FACILITY:  [facility]   PHYSICIAN:  Ollen Gross, M.D.         DATE OF BIRTH:   DATE OF ADMISSION:  11/11/2005  DATE OF DISCHARGE:  11/16/2005                                HISTORY & PHYSICAL   CHIEF COMPLAINT:  Right knee pain.   HISTORY OF PRESENT ILLNESS:  The patient is a 77 year old female well-known  to [employer], having previously undergone a resection arthroplasty back  in January of 2007 for a failed right total knee.  She has undergone  appropriate treatment with IV antibiotics.  She has reached a point now  where she is ready to have reimplantation.  The risks and benefits have been  discussed.  The patient is subsequently admitted to the hospital.   ALLERGIES:  LATEX.  INTOLERANCES:  CODEINE CAUSES NAUSEA.   CURRENT MEDICATIONS:  1.  Coumadin.  2.  Atenolol.  3.  Simvastatin.  4.  Clopidogrel.  5.  Cipro.  6.  Vancomycin IV.   PAST MEDICAL HISTORY:  1.  History of CVA in 1982.  2.  Hypertension.  3.  Coronary arterial disease.  4.  History of MI in 2002.  5.  Diet-controlled borderline diabetes.  6.  Arthritis.  7.  Aortic murmur.   PAST SURGICAL HISTORY:  1.  Right total knee replacement and arthroplasty in May of 1997.  2.  Cardiac catheterization with intervention and stent placement in      circumflex coronary artery and right coronary artery in 2005.  3.  Recent right total knee arthroplasty resection in January of 2007.   FAMILY HISTORY:  Mother deceased at 9 of heart disease.  Father deceased at  82 with diabetes.  Hypertension also runs in the family.   SOCIAL HISTORY:  Widowed.  Nonsmoker.  No alcohol.  Eight children.  Two  story home with five steps.   REVIEW OF SYSTEMS:  GENERAL:  No fevers, chills, night sweats.  NEUROLOGIC:  No seizures or paralysis.  RESPIRATORY:  No  shortness of breath, productive  cough, or hemoptysis.  CARDIOVASCULAR:  No chest pain, angina, or orthopnea.  GI:  No nausea, vomiting, diarrhea, or constipation.  GU:  No dysuria,  hematuria, discharge.  MUSCULOSKELETAL:  Right knee.   PHYSICAL EXAMINATION:  VITAL SIGNS:  Pulse 64, respiratory rate 12, blood  pressure 118/62.  GENERAL:  A 77 year old [place] female, well-nourished, well-  developed, in no acute distress, accompanied by her daughter.  She is alert,  oriented, cooperative, and pleasant.  HEENT:  Normocephalic, atraumatic.  Pupils are equal and reactive.  Noted to  wear glasses.  Extraocular muscles intact.  NECK:  Supple.  She has a left carotid bruit with a faint right bruit noted,  likely referred from the thoracic cavity.  CHEST:  Clear in the anterior and posterior chest walls.  No rhonchi, rales,  or wheezing.  HEART:  Regular rate and rhythm with a crescendo-decrescendo  grade 2-3/6  systolic ejection murmur best heard over the aortic point.  S1 and S2 is  noted.  ABDOMEN:  Soft, round, nontender.  Bowel sounds present.  Marland Kitchen  EXTREMITIES:  Right knee:  Motor function is intact.  Sensation is intact.  Incision well-healed.   IMPRESSION:  Status post resection arthroplasty, right total knee.   PLAN:  The patient will be admitted to [facility] to undergo  reimplantation of the right total knee.  Surgery will be performed by Dr.  Ollen Gross.      Alexzandrew L. Julien Girt, P.A.      Ollen Gross, M.D.  Electronically Signed    ALP/MEDQ  D:  11/29/2005  T:  12/01/2005  Job:  [id number]   cc:   Ollen Gross, M.D.  Fax: [phone]   Learta Codding, M.D. [facility]  [address]  [address]  [place]  [place] [address]   Barry Dienes. Eloise Harman, M.D.  Fax: [phone]

## 2011-01-16 NOTE — Discharge Summary (Signed)
NAMELAWANDA, HOLZHEIMER               ACCOUNT NO.:  [id number]   MEDICAL RECORD NO.:  [id number]          PATIENT TYPE:  ORB   LOCATION:  4532                         FACILITY:  [facility]   PHYSICIAN:  Ellwood Dense, M.D.   DATE OF BIRTH:  1930-06-14   DATE OF ADMISSION:  09/21/2005  DATE OF DISCHARGE:                                 DISCHARGE SUMMARY   DISCHARGE DIAGNOSIS:  1.  Infected right knee replacement requiring right total knee resection      arthroplasty with placement of antibiotic spacer.  2.  Anorexia, improved.  3.  Acute blood loss anemia.  4.  Hypertension.  5.  Pain control much improved.   HISTORY OF PRESENT ILLNESS:  Ms. Janak is a 77 year old female with a  history of coronary artery disease, DM type 2, right total knee replacement  in 1997, with continued pain and increase in activity on bone scan  indicative of indolent infection.  The patient elected to undergo right  total knee replacement resection arthroplasty on January 17 by Dr. Lequita Halt.  Postop antibiotic spacer placed and cultures taken positive for PNN, no  organism seen.  ID was consulted for input and recommended checking sed rate  and iron studies.  The patient was started on IV vancomycin with  recommendations for six weeks of treatment.  Also, during her stay on acute,  the patient has been having problems with syncope secondary to decreased  blood pressure and anemia requiring 2 units packed red blood cells.  Therapy  was initiated and the patient is noted to have limitation secondary to pain  control and decreased endurance, currently ADLs morbidly impaired and  subacute was consulted for further therapies.   PAST MEDICAL HISTORY:  Significant for hypertension, DM type 2, headaches  with negative temporal artery biopsy, diverticulosis, chronic anemia,  coronary artery disease with PTCA in 2005, pancreatitis in 2001, TAH and  BSO, excision colon polyps, chronic constipation.   FAMILY HISTORY:   Positive for diabetes.   ALLERGIES:  Latex and ACE inhibitors.   SOCIAL HISTORY:  The patient is widowed, lives with family in two level home  with five steps at entry, family is very supportive.  The patient does not  use any tobacco or alcohol.   HOSPITAL COURSE:  Ms. Jaislyn Blinn was admitted to subacute on September 21, 2005, for SACU level therapies to consist of PT and OT daily.  Past  admission, she was maintained on Coumadin for DVT prophylaxis.  INR at the  time of discharge is therapeutic at 2.9, the patient discharged on 2 mg  Coumadin daily to continue until October 17, 2005, for now.  The patient  was maintained on IV vancomycin and this is to continue through February 28  to complete her six weeks course of antibiotic therapy.  The patient's right  knee incision has healed well without any signs or symptoms of infection or  drainage noted.  Follow up labs done past admission showed patient's postop  anemia to be stable with hemoglobin 9.4, hematocrit 28.3.  The patient is to  continue on iron  supplements t.i.d. for now.  Check of electrolytes revealed  sodium 138, potassium 3.9, chloride 106, CO2 27, BUN 10, creatinine 0.9,  glucose 115.  Albumin low at 2.1.  The patient noted to have Proteus UTI  treated with Cipro x 7 days.  Pain control initially was an issue.  She was  started on OxyContin with Oxy-IR.  However, was noted to have some confusion  on this.  Currently, pain is managed with OxyContin 10 mg q.8h. with p.r.n.  Ultram and scheduled Tylenol.  The patient to continue with knee immobilizer  on right knee when ambulating, no range of motion right knee.  During her  stay in subacute, the patient has progressed along to requiring set up to  min assist for ADLs.  She is currently min assist with close supervisions  for transfers, min assist for ambulating 40-90 feet with a rolling walker.  She is weight-bearing as tolerated on the right lower extremity but tends  to  keep non-weight bearing secondary to pain.  She is min assist for car  transfers.  Family education regarding navigation of steps to be done on day  of discharge.  On October 02, 2005, the patient is to be discharged to home.   DISCHARGE MEDICATIONS:  Cipro 250 mg p.o. b.i.d., Plavix 75 mg daily,  ferrous sulfate 325 mg t.i.d., Protonix 40 mg daily, Coumadin 2 mg q.p.m.,  Zocor 40 mg daily, Tenormin 50 mg q.h.s., IV vancomycin 120 mg daily through  February 28, Robaxin 500 mg 1 p.o. q.i.d. p.r.n. spasm, Senokot S 2 p.o.  q.h.s. for constipation, OxyContin CR 10 mg p.o. q.8h. x 2 weeks then taper  to 1 p.o. q.12h., Ultram 50 mg p.o. q.i.d. p.r.n. pain, Tylenol 650 mg p.o.  t.i.d. for pain.   DISCHARGE INSTRUCTIONS:  Activities as tolerated with the use of walker and  assistance.  Diet is diabetic.  Use knee immobilizer when ambulation, no  range of motion, right knee.  Wash incision with antibacterial soap and  water, keep clean and dry.  The patient is to follow up with Dr. Lequita Halt for  postoperative check.  Follow up with Dr. Drue Novel for routine check.  Follow up  within Dr. Thomasena Edis as needed.      Greg Cutter, P.A.    ______________________________  Ellwood Dense, M.D.    PP/MEDQ  D:  10/02/2005  T:  10/03/2005  Job:  [id number]   cc:   Ollen Gross, M.D.  Fax: [phone]   Wanda Plump, MD [facility]  [phone] [address] [place], [place] [address]

## 2011-01-16 NOTE — Op Note (Signed)
NAME:  [name], [name]                         ACCOUNT NO.:  [id number]   MEDICAL RECORD NO.:  [id number]                   PATIENT TYPE:  AMB   LOCATION:  ENDO                                 FACILITY:  [facility]   PHYSICIAN:  Petra Kuba, M.D.                 DATE OF BIRTH:  08-Jan-1930   DATE OF PROCEDURE:  03/13/2003  DATE OF DISCHARGE:                                 OPERATIVE REPORT   PROCEDURE:  Colonoscopy.   INDICATIONS FOR PROCEDURE:  Worsening constipation questionably due to her  medicine, also with microcytic anemia worrisome two years ago due for  colonic screening.   Consent was signed after risks, benefits, methods, and options were  thoroughly discussed in the office with both the patient and her daughter.   MEDICINES USED:  Demerol 80, Versed 8.   DESCRIPTION OF PROCEDURE:  Rectal inspection was pertinent for small  external hemorrhoids. Digital exam was negative. The pediatric video  adjustable colonoscope was inserted and easily advanced through the colon to  the level of the ileocecal valve. Unfortunately due to looping despite  rolling her on her back and her right side and abdominal pressures could not  advance to the cecal pole. The scope was slowly withdrawn. Other than some  occasional scattered diverticula, no signs of bleeding, polyps, masses or  other abnormalities were seen as we slowly withdrew back to the rectum.  Diverticula were in the transverse and left side. Once back in the rectum,  anorectal pull through and retroflexion confirmed the hemorrhoids. The scope  was removed. We went ahead and inserted the regular scope and was easily  able to advance to the descending colon, the ileocecal valve and the  _________ and with both left and right sided pressure with her on her left  side were able to advance to the cecum which was identified by the  appendiceal orifice and the ileocecal valve. The prep was fairly adequate.  Did require lots of washing  and suctioning. Some vegetable matter could not  be suctioned but could be moved to different parts of the colon and again on  moderate withdrawal no additional findings were seen. The scope was re-  retroflexed in the rectum. Air was suctioned, scope removed. The patient  tolerated the procedure well. There was no obvious or immediate  complications.   ENDOSCOPIC DIAGNOSES:  1. Internal and external hemorrhoids.  2. Left occasional diverticula included in the transverse.  3. Otherwise within normal limits to the cecum using the regular scope and     the IC valve using the pediatric adjustable.    PLAN:  Consider re-screen in 5-10 years if doing well medically. Happy to  see back p.r.n. or in two months to recheck CBC, possibly guaiac, recheck  symptoms and see if a one time endoscopy or upper GI small bowel follow  through is needed. Certainly if Dr. Eloise Harman is  concerned could proceed with  the barium test sooner.                                               Petra Kuba, M.D.    MEM/MEDQ  D:  03/13/2003  T:  03/13/2003  Job:  [id number]   cc:   Barry Dienes. Eloise Harman, M.D.  [address]  [place]  [place] [address]  Fax: [phone]

## 2011-01-16 NOTE — Cardiovascular Report (Signed)
[place]  Patient:    [name], [name] Visit Number: [id number] MRN: [id number]          Service Type: MED Location: 1W 0158 01 Attending Physician:  Talitha Givens Dictated by:   Lewayne Bunting, M.D. [facility] Proc. Date: 10/18/01 Admit Date:  10/15/2001 Discharge Date: 10/18/2001   CC:         Petra Kuba, M.D.  Barry Dienes Eloise Harman, M.D.  Luis Abed, M.D. [facility]   Cardiac Catheterization  DATE OF BIRTH: [birth year]  REFERRING PHYSICIANS: Petra Kuba, M.D. and Barry Dienes. Eloise Harman, M.D.  CARDIOLOGIST: Luis Abed, M.D.  PROCEDURES PERFORMED: 1. Left heart catheterization with selective coronary angiography. 2. Ventriculography.  DIAGNOSES: 1. Two-vessel coronary artery disease with high-grade lesion of the proximal  left anterior descending. 2. High-grade first obtuse marginal lesion (infarct-related artery).  INDICATIONS: The patient is a 77 year old, African American female, with a past medical history of hypertension and glucose intolerance. The patient likely has the metabolic syndrome. She was admitted on February 15 with an acute coronary syndrome. The patients troponin levels were markedly elevated and she had nonspecific T wave changes on 12-lead electrocardiogram. The patient was treated with aspirin and heparin as well as glycoprotein IIb/IIIa inhibitor. She was subsequently scheduled for a diagnostic catheterization to assess her coronary anatomy.  DESCRIPTION OF PROCEDURE: After informed consent was obtained, the patient was brought to the catheterization laboratory. The right groin was sterilely prepped and draped. Lidocaine 1% was infiltrated. A 6 French arterial sheath was placed in the right femoral artery without difficulty. This was done according to the modified Seldinger technique. Subsequently, a JL4 and JR4 catheters were used to engage the left and right coronary system respectively. Selective  coronary angiography was then performed in various projections using manual injection of contrast. After coronary angiography, attention was turned to ventriculography. A 6 French angled pigtail catheter was placed in the left ventricular cavity. Appropriate left-sided hemodynamics were obtained. Subsequent, the catheter was pulled back into the aorta and removed. At the termination of the procedure, all catheters and sheath were removed and the patient was brought back to the holding area. No complications were noted.  FINDINGS: 1. Hemodynamics: Left ventricular pressure 110/30 mmHg. Aortic pressure    110/70 mmHg. 2. Ventriculography: A single plane RAO projection. Ejection fraction 60%    without significant wall motion abnormalities.  SELECTIVE CORONARY ANGIOGRAPHY: 1. Left main coronary was a very large caliber vessel with some distal    tapering and a focal stenosis of approximately 20%. 2. The left anterior descending artery was a large caliber vessel with    high-grade somewhat tortuous and irregular lesion in the proximal LAD.    The stenosis was felt to be approximately 80-90%. This was just beyond the    takeoff of a moderate sized diagonal branch. The mid LAD had a 30-40%    diffuse stenosis. 3. The circumflex coronary artery was a large caliber vessel giving rise    to a large first obtuse marginal branch which has a focal 50% stenosis    proximally and a mid 80% stenosis, which appeared to be hazy and is    likely the infarct-related artery. The circumflex in the AV groove has    a diffuse 60% stenosis. 4. The right coronary artery is a diffusely diseased vessel, large in the    caliber. There is a 30% diffuse stenosis in the mid vessel. More distal  there is a 40-50% stenosis with rapid distal tapering and diffusely    diseased posterior descending artery and two smaller posterolateral branches.  RECOMMENDATIONS: Angiographic images have been reviewed with Dr.  Riley Kill. The patient is likely diabetic but has normal left ventricular ejection fraction. It appears based on the patients clinical history, and the appearance of the obtuse marginal branch, that this is the infarct-related artery. After discussions, the patient was given preference to percutaneous coronary intervention. The plan is to proceed with a combined intervention to the obtuse marginal branch and to the LAD. This procedure has been scheduled for tomorrow with either Dr. Juanda Chance or Dr. Gerri Spore. Dictated by:   Lewayne Bunting, M.D. [facility] Attending Physician:  Talitha Givens DD:  10/18/01 TD:  10/18/01 Job: [id number]

## 2011-01-16 NOTE — Op Note (Signed)
[name], [name]               ACCOUNT NO.:  [id number]   MEDICAL RECORD NO.:  [id number]          PATIENT TYPE:  INP   LOCATION:  0002                         FACILITY:  [facility]   PHYSICIAN:  Ollen Gross, M.D.    DATE OF BIRTH:  14-Apr-1930   DATE OF PROCEDURE:  DATE OF DISCHARGE:                                 OPERATIVE REPORT   Audio too short to transcribe (less than 5 seconds)      Ollen Gross, M.D.     FA/MEDQ  D:  09/16/2005  T:  09/16/2005  Job:  [id number]

## 2011-01-16 NOTE — H&P (Signed)
[facility]  Patient:    [name], MANGELS Visit Number: [id number] MRN: [id number]          Service Type: EMS Location: ED Attending Physician:  Donnetta Hutching Dictated by:   Gwen Pounds, M.D. Admit Date:  10/15/2001   CC:         Luis Abed, M.D. [facility]  Barry Dienes. Eloise Harman, M.D.   History and Physical  PRIMARY CARE PHYSICIAN:  Dr. Eloise Harman.  PRIMARY CARDIOLOGIST:  ?, presumably Dr. Myrtis Ser.  CHIEF COMPLAINT:  Chest pain, positive enzymes, acute coronary syndrome.  HISTORY OF PRESENT ILLNESS:  This 77 year old African-American female with morbid obesity, hyperlipidemia, and no tobacco, positive family history, age greater than 62, no history of diabetes, no history of hypertension, but blood pressure 198 on arrival was in her usual state of health until approximately 10:30 a.m.  Was having fried fish, souse meats, and coffee.  Ten to 15 minutes later developed severe substernal chest pressure described as a hurt, along with associated symptoms including shortness of breath, diaphoresis, nausea, and vomiting.  The pain did not radiate.  She took Mylanta and Tums without success.  She came to the ED.  The pain lasted two to three hours.  It went away with nitroglycerin sublingual and paste.  At the time she also had a GI cocktail and Protonix.  EKG showed subtle lateral changes, and her enzymes were positive.  I was called to admit.  On arrival I found out that she may have had an angioplasty in the past.  She described Dr. Myrtis Ser as her cardiologist, and then she described Dr. Eloise Harman as her cardiologist. Neither of these physicians perform heart catheterizations, but she described the procedure pretty well.  She denies any current symptomatology.  PAST MEDICAL HISTORY: 1. Hiatal hernia. 2. Right total knee replacement. 3. History of vocal cord tumor in 1982. 4. Hyperlipidemia, off medications. 5. History of total abdominal hysterectomy. 6.  History of tubal ligation.  ALLERGIES:  ASPIRIN, tachycardia and palpitations.  There is a questionable LATEX allergy.  MEDICATIONS:  The only medication she is taking right now is a multivitamin. She is off of her lipid-lowering medication.  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 She does not smoke, she does not drink.  She is widowed.  FAMILY HISTORY:  Her dad had diabetes.  Her mother had coronary artery disease.  REVIEW OF SYSTEMS:  She wears glasses, bifocals.  She has some teeth missing. Currently without chest pain, shortness of breath.  Without nausea or vomiting.  Her stomach is settled.  Her review of systems is otherwise negative.  Again, she has been having recent angina.  She described it as chest tightness, shortness of breath with exertion.  PHYSICAL EXAMINATION:  VITAL SIGNS:  Blood pressure on arrival 198/98, now 142/73, saturation 97% on room air, breathing at 20 times per minute.  GENERAL:  Alert and oriented x3.  In no acute distress.  HEENT:  PERRL.  EOMI.  Oropharynx is moist.  NECK:  No JVD.  LUNGS:  Clear to auscultation bilaterally.  CARDIAC:  Regular rhythm, without murmurs.  ABDOMEN:  Soft, obese.  EXTREMITIES:  Trace 1+ edema bilaterally.  Dorsalis pedis and femoral pulses 2+ bilaterally.  LABORATORY DATA:  CK 195, MB 12.5, index 6.4, troponin I 0.53.  CBC shows mild anemia with a hemoglobin of 11.8, otherwise within normal limits.  BMET is within normal limits except for elevated  glucose of 137.  LFTs within normal limits.  Lipase is 26.  EKG shows normal sinus rhythm, T-wave inversion in I and aVL.  Otherwise, lateral changes noted.  This is more significant than June 2001.  Chest x-ray:  No acute disease.  ASSESSMENT:  This is a 77 year old African-American female with a presumed lateral myocardial infarction/acute coronary syndrome and with a questionable history of a percutaneous angioplasty by a  [place] cardiologist a few years back.  PLAN: 1. Admit to Dr. Worthy Flank service. 2. Telemetry to follow enzymes. 3. Cardiology is to consult and assume care if willing. 4. Anticoagulate with heparin and Integrilin. 5. Hold aspirin secondary to allergy:  Probably will give Plavix instead. 6. Since no current chest pain, cardiology may keep her cooled off with    catheterization later or tomorrow if they see fit. 7. Slight increase in glucose:  Will check A1C and place on insulin sliding    scale if CBGs warrant. 8. Hypertension:  Will place on beta blocker, ACE inhibitor. 9. For chest pain, will place on nitroglycerin, but keep her on nitroglycerin    paste for now.Dictated by:   Gwen Pounds, M.D. Attending Physician:  Donnetta Hutching DD:  10/15/01 TD:  10/15/01 Job: [id number]

## 2011-01-16 NOTE — Discharge Summary (Signed)
[name], [name]               ACCOUNT NO.:  [id number]   MEDICAL RECORD NO.:  [id number]          PATIENT TYPE:  INP   LOCATION:  1513                         FACILITY[facility]   PHYSICIAN:  Ollen Gross, M.D.    DATE OF BIRTH:  04-Nov-1929   DATE OF ADMISSION:  09/16/2005  DATE OF DISCHARGE:  09/21/2005                                 DISCHARGE SUMMARY   ADMITTING DIAGNOSES:  1.  Failed right total knee arthroplasty.  2.  History of cerebrovascular accident in 80.  3.  Hypertension.  4.  Coronary arterial disease.  5.  History of myocardial infarction, 2002.  6.  Status post cardiac catheterization with multiple stenting.  7.  Diet controlled borderline diabetes.  8.  Arthritis.   DISCHARGE DIAGNOSES:  1.  Loose, painful right total knee arthroplasty, status post right knee      resection arthroplasty with placement of antibiotic impregnated spacer..  2.  Postoperative mild heart failure.  3.  Postoperative confusion/unresponsive episode, resolved.  4.  Postoperative fever, improved.  5.  Postoperative blood loss anemia.  6.  Status post transfusion without sequelae.  7.  Postoperative hyponatremia, improved.  8.  History of cerebrovascular accident in 71.  9.  Hypertension.  10. Coronary arterial disease.  11. History of myocardial infarction, 2002.  12. Status post cardiac catheterization with multiple stenting.  13. Diet controlled borderline diabetes.  14. Arthritis.   PROCEDURE:  September 16, 2005, right knee resection arthroplasty with  placement of antibiotic impregnated spacers.  Surgeon Dr. Lequita Halt, assistant  Avel Peace, PA-C, anesthesia general and postop Marcaine pain pump.   DRAINS:  One.   TOURNIQUET TIME:  62 minutes.   CONSULTS:  1.  Infectious disease, Dr. Darlina Sicilian.  2.  Medical service, Dr. Jarome Matin.  3.  Rehab services.   BRIEF HISTORY:  [name] is a 77 year old female with a right total knee  done about 8-9 years ago.  She  has never had good pain relief, progressive  worsening pain and dysfunction.  Initially saw her several months ago,  concerned by loose prosthesis, bone scan confirmed this loosening.  Overall  picture consistent with indoline infection, now presents for revision versus  resection.   LABORATORY DATA:  Preop CBC:  Hemoglobin 9.3, hematocrit 29.3, white cell  count normal at 5.4.  Postop hemoglobin 9.4, drifted down to 9.1 and then  8.5, given 2 units of blood, back up to 9.7 and 29.7.  The differential on  the admission:  CBC and neutrophils elevated, 81 lymphs, 7 monos, 2 eos, 0  baso.  Anemia hematology study:  Percent retic 1.1, RBC 4.07, immature retic  fraction elevated at 24.7, sed rate elevated at 108.  PT/PTT preop 14.6 and  40, respectively with INR 1.1  Last noted PT/INR 25.8 and 2.3.  Chem panel  on admission:  Low albumin of 2.6.  Remaining chem panel within normal  limits.  Serial BMETs were followed.  Sodium dropped from 137 to 133, back  up to 135.  Hemoglobin electrophoresis:  Hemoglobin F 00, hemoglobin S 00,  hemoglobin  C 0 hemoglobin other 0, hemoglobin A 97.2, hemoglobin A2 2.8.  B-  type natriuretic peptide first taken on September 18, 2005, elevated at 128.  The second level that day elevated higher to 187.  B12 normal at 382,  ferritin high at 1568.  Second amenia studies taken on September 17, 2005:  Iron low at 15, TIBC low at 181, percent saturations 8, UIBC at 166,  vancomycin trough random on September 20, 2005, 7.3.  Preop UA cloudy, small  leukocyte esterase, minimal epithelials, 3-6 white, few bacteria.  Follow up  UA on September 16, 2005, positive protein, small hemoglobin, rare epithelial  cells, 0-3 white, 0-2 red, rare bacteria.  Blood group type B positive.  Blood cultures x2 no growth.  STAT Gram stain no organisms x2.  Wound  culture September 16, 2005, Gram smear showed no organism, no growth on the  culture.  Wound cultures, second one, Gram stain showed no  organisms.  Culture was negative.  Anaerobic culture, Gram stain was no organisms, no  anaerobes isolated.  Second anaerobic culture no organisms seen.  No  anaerobes isolated.  EKG dated September 18, 2005, normal sinus rhythm, normal  EKG unconfirmed.  Portable chest on September 16, 2005, mild congestive heart  failure.  Portable chest on September 18, 2005, right arm PICC line, stable  mild cardiomegaly, no acute cardiopulmonary process.  Portable chest on  September 18, 2005, cardiomegaly, improving CHF.   HOSPITAL COURSE:  Admitted to [facility], tolerated the procedure  well, later transferred to recovery room and the orthopedic floor for  postoperative care.  Medical consult was called in to Dr. Jarome Matin  for medical management of the patient postoperatively.  She did have fever  postop and was felt to be noninfectious but did work-up with a UA, chest x-  ray as above.  Infectious disease consult was called in.  The patient was  seen by Dr. Darlina Sicilian, who followed along for the probable infected  prosthesis which was removed.  Antibiotic cement spacer was placed.  One  postop day 1, the patient was seen in rounds, doing well, briefly discussed  the operative findings.  PICC line was ordered for IV antibiotics to be  placed in.  The drain which was placed at the time of surgery was left in at  this time.  The follow up chest x-ray did show some mild CHF, B-type  natriuretic peptides were elevated when checked.  The labs were followed  very closely.  Unfortunately, the hemoglobin drifted down postoperatively  down to 8.5.  She did receive 2 units of blood, and her hemoglobin came back  to 9.5.  The initial Gram stains did not show any organisms.  Initial  cultures were negative.  The recommendations by Dr. Maurice March, felt that she  would need to be on vancomycin IV.  Arrangements were going to be made for her to continue to receive that post hospital care.  Unfortunately, she  had  an episode on the afternoon of September 18, 2005, where she was unresponsive,  sitting in bed in a chair.  Medical services were called following the rapid  response team.  The patient was treated and felt that this was more of a  narcotic response.  The patient was placed back into bed.  Chest x-rays were  ordered which did show some improving congestive heart failure.  This was  felt to be more of a narcotic response, as stated.  She was  seen by  medicine, had a little bit of a dry cough but no shortness of breath, no  chest pain, discontinued the morphine, schedule Tylenol and added Ultram,  continued to receive vancomycin.  The unresponsive episode did not recur.  It was noted that she also had had some low blood pressures associated with  that.  By the following day, she was doing much better.  Her pressure was  back up.  No further unresponsive episodes.  Pain was getting a little  better.  She was afebrile.  Hemoglobin had responded well to the  transfusion, back up to 9.5.  Continued to receive therapy and IV  antibiotics.  It was noted from a rehab standpoint due to the multiple  issues, that the patient would require inpatient stay.  It was noted later  in the hospital stay on September 21, 2005, that a bed opened up on the SACU  unit.  From a medical standpoint, she was stabilized, no problems postop  after the unresponsive episode.  The mid CHF which was noted postoperatively  was improving.  Pain was under better control, and she was switched over to  the SACU at that time.   DISCHARGE PLAN:  1.  The patient discharged to [facility].  2.  Discharge diagnoses, please see above.  3.  Discharge medications:  Continue current medications as per the [facility].  4.  Diet:  Cardiac diabetic diet.  5.  Activity:  Weightbearing as tolerated to the right lower extremity, knee      immobilizer for all weightbearing and ambulation following resection      arthroplasty, daily dressing  changes.  Do not submerge the incision      under water.  6.  Follow up 2 weeks from surgery or following the discharge from [facility] unit.   DISPOSITION:  [place] [facility].   CONDITION ON DISCHARGE:  Improving.      Alexzandrew L. Julien Girt, P.A.      Ollen Gross, M.D.  Electronically Signed    ALP/MEDQ  D:  11/04/2005  T:  11/05/2005  Job:  [id number]   cc:   Barry Dienes. Eloise Harman, M.D.  Fax: [phone]   Fransisco Hertz, M.D.  Fax: [phone]   Learta Codding, M.D. [facility]  [address]  [address]  [place]  [place] [address]

## 2011-01-16 NOTE — Assessment & Plan Note (Signed)
[facility]                            CARDIOLOGY OFFICE NOTE   KEALOHILANI, MAIORINO                        MRN:          [id number]  DATE:08/06/2006                            DOB:          [birth year]    REASON FOR VISIT:  Routine cardiac follow up.   HISTORY OF PRESENT ILLNESS:  I saw Ms. Osmond back in June.  She has a  history of coronary artery disease outlined in my previous note.  She  denies having any problems with angina or dyspnea on exertion.  She  still has some right knee pain following surgery, but states this has  been doing better and she is now ambulating with a cane instead of a  walker.  Her electrocardiogram today shows sinus bradycardia at 57 beats  per minute with a single premature ventricular complex and nonspecific  ST-T wave changes.  No changes are noted in the patient's prior tracing.   I reviewed her medications today.  She ran out of Plavix and needs  refills also for hydrochlorothiazide and potassium.  She has not had a  follow up lipid profile and is now no Lipitor 80 mg daily following her  LDL of 186 back in June.   ALLERGIES:  LATEX.   PRESENT MEDICATIONS:  1. Aspirin 81 mg p.o. daily.  2. Hydrochlorothiazide 25 mg p.o. daily.  3. Potassium chloride 10 mEq p.o. daily.  4. Lipitor 80 mg p.o. daily.  5. Atenolol 50 mg p.o. daily.  6. Plavix 75 mg p.o. daily.   REVIEW OF SYMPTOMS:  As described in the history of present illness.   PHYSICAL EXAMINATION:  VITAL SIGNS:  Blood pressure today 145/79, heart  rate 64, weight 247 pounds up from 232.  GENERAL:  This is an obese woman in no acute distress.  HEENT:  Conjunctiva is normal, oropharynx is clear.  NECK:  Supple without loud bruits or elevated jugular venous pressure.  No thyromegaly is noted.  LUNGS:  Clear without labored breathing at rest.  CARDIAC:  Exam reveals a regular rate and rhythm with a 3/6 systolic  murmur heard at the right base.  No S3 gallop  is noted.  ABDOMEN:  Soft, nontender.  EXTREMITIES:  No significant pitting edema.   IMPRESSION AND RECOMMENDATIONS:  1. History of multi-vessel coronary artery disease with preserved      ejection fraction status post previous non-ST elevation myocardial      infarction with interventions to all major vessels including bare      metal stent placement to the circumflex and drug eluting stent      placement to the right coronary artery in 2005.  Ischemic testing      was stable in December 2006.  We will plan to continue medical      therapy for the time being.  I have provided her refills for      Plavix, hydrochlorothiazide, and potassium today and will make no      other major medicine changes.  She does have a cardiac murmur with      previous documented  possible bicuspid aortic valve, but no major      stenosis, and I will plan a follow up echocardiogram to review      this.  I will also obtain fasting lipids and liver function tests      and we will plan to see her back over the next six months.  2. Further plans to follow.   IMPRESSION:   PLAN:     Jonelle Sidle, MD  Electronically Signed    SGM/MedQ  DD: 08/06/2006  DT: 08/06/2006  Job #: [id number]   cc:   Barry Dienes. Eloise Harman, M.D.

## 2011-01-16 NOTE — H&P (Signed)
NAMEJULYSSA, [name]               ACCOUNT NO.:  [id number]   MEDICAL RECORD NO.:  [id number]          PATIENT TYPE:  ORB   LOCATION:  4532                         FACILITY:  [facility]   PHYSICIAN:  Erick Colace, M.D.DATE OF BIRTH:  18-Aug-1930   DATE OF ADMISSION:  09/21/2005  DATE OF DISCHARGE:                                HISTORY & PHYSICAL   REASON FOR ADMISSION:  Right knee pain, right knee infection status post  antibiotic spacer placement.   [name] is a 77 year old female with history of CAD, type 2 diabetes,  right TKR in 1997, continued pain and increased activity.  Had a bone scan  indicative of indolent infection.  Underwent a right total knee resection  arthroplasty January 17 per Dr. Ollen Gross.  Intraoperative antibiotic  spacer placed, cultures taken.  No organisms seen.  Dr. Darlina Sicilian consulted  for input.  Recommended sedimentation rate, iron studies.  Did have an  episode of syncope with decreased blood pressure and anemia.  Transfused 2  units packed red blood cells.  Started on IV vancomycin.  Recommendation for  six-week treatment.  Therapies initiated.  Patient limitation secondary to  pain control and decreased endurance.   Chest x-ray January 17 mild CHF.  EKG normal sinus rhythm, 62 beats per  minute.   REVIEW OF SYSTEMS:  Positive for pain in the knee on the right side,  cervicalgia, anxiety, constipation.   PAST MEDICAL HISTORY:  1.  Diabetes type 2.  2.  Hypertension.  3.  Headaches with negative temporal artery biopsy.  4.  Diverticulosis.  5.  Chronic anemia.  6.  CAD status post PTCA 2005.  7.  Pancreatitis 2001.   PAST SURGICAL HISTORY:  1.  TAH/BSO.  2.  Excision of vocal cord polyps.   FAMILY HISTORY:  Positive diabetes.   SOCIAL HISTORY:  Widowed.  Lives with family.  Two-level home, five steps to  enter.  Family supportive.   FUNCTIONAL HISTORY:  Independent with cane prior to admission.  Drives.  Functional status  impaired with ADLs and mobility currently.   HOME MEDICATIONS:  1.  Atenolol 50 mg p.o. daily.  2.  Zocor 40 mg p.o. daily.  3.  Amaryl 2 mg p.o. daily.  4.  Aspirin 81 mg p.o. daily.  5.  Plavix 75 mg p.o. daily.  6.  Vicodin p.r.n.  7.  Senna p.r.n.   ALLERGIES:  LATEX and ACE.   Hemoglobin 9.7.  BUN 9, creatinine 0.8, sodium 135, potassium 3.6.   PHYSICAL EXAMINATION:  VITAL SIGNS:  Blood pressure 163/84, pulse 66,  respirations 20, temperature 99.  GENERAL:  No acute distress.  Mood and affect appropriate.  HEENT:  Eyes anicteric, not injected.  External ENT normal.  NECK:  Supple without adenopathy.  LUNGS:  Respiratory effort was good.  Lungs clear.  HEART:  Regular rate and rhythm.  No rubs or extra sounds.  EXTREMITIES:  Edema in the right lower extremity pretibially.  ABDOMEN:  Positive bowel sounds, soft.  NEUROLOGIC:  Motor strength is 5/5 bilateral deltoid, biceps, triceps, grip.  Left lower  extremity is 4/5 in the hip flexion, knee extensor, dorsiflexor  on the right side.  She has 1 in the hip flexion, knee extensor, and 4 at  the ankle dorsiflexor, plantar flexor.  Sensation is normal.  Mood, memory,  affect is normal.   IMPRESSION:  1.  Infected right knee, history of TKR status post resection arthroplasty      with placement antibiotic spacer on intravenous vancomycin day 4 of 42.  2.  Right knee pain.  Add Oxy-CR to oxycodone.  Monitor for mental status      changes.  3.  Deep venous thrombosis prophylaxis.  Continue Coumadin.  INR is      therapeutic.  4.  Anorexia.  Add Ensure supplements.  5.  Acute blood loss anemia.  Monitor hemoglobin on Fe supplements.  6.  Hypertension.  Continue on Tenormin.   Estimated length of stay is 7-10 days.   Patient good rehabilitation candidate.  Goals will be limited to mainly  wheelchair and mod independent transfers given inability to bend knee.      Erick Colace, M.D.  Electronically Signed      AEK/MEDQ  D:  09/21/2005  T:  09/22/2005  Job:  [id number]   cc:   Barry Dienes. Eloise Harman, M.D.  Fax: [phone]   Fransisco Hertz, M.D.  Fax: [phone]   Learta Codding, M.D. [facility]  [address]  [address]  [place]  [place] [address]   Petra Kuba, M.D.  Fax: [phone]   Ollen Gross, M.D.  Fax: [phone]

## 2011-01-16 NOTE — Op Note (Signed)
[name], [name]               ACCOUNT NO.:  [id number]   MEDICAL RECORD NO.:  [id number]          PATIENT TYPE:  INP   LOCATION:  0002                         [facility]ACILITY[facility]  [facility]   PHYSICIAN:  Ollen Gross, M.D.    DATE OF BIRTH:  02-16-1930   DATE OF PROCEDURE:  09/16/2005  DATE OF DISCHARGE:                                 OPERATIVE REPORT   PREOPERATIVE DIAGNOSIS:  Loose painful right total knee arthroplasty.   POSTOPERATIVE DIAGNOSIS:  Loose painful right total knee arthroplasty.   PROCEDURE:  Right knee resection arthroplasty with placement of antibiotic  impregnated spacer.   SURGEON:  Ollen Gross, M.D.   ASSISTANT:  Alexzandrew L. Julien Girt, P.A.   ANESTHESIA:  General with postop Marcaine pain pump.   ESTIMATED BLOOD LOSS:  Minimal.   DRAIN:  Hemovac x 1.   TOURNIQUET TIME:  62 minutes at 300 mmHg.   COMPLICATIONS:  None. Condition stable to recovery.   BRIEF CLINICAL NOTE:  [name] is a 77 year old female who had a right  total knee arthroplasty done about 8 to 9 years ago. She never had good pain  relief and had progressively worsening pain and dysfunction over the ensuing  time. I initially saw her several months ago and I was concerned about a  loose prosthesis. Bone scan confirmed loosening and the overall clinical  picture was consistent with an indolent infection. She presents now for  revision versus resection arthroplasty.   PROCEDURE IN DETAIL:  After successful initiation of general anesthetic,  tourniquet placed on the right thigh and the right lower extremity prepped  and draped in usual sterile fashion. Extremity was wrapped in Esmarch, knee  flexed and tourniquet inflated 300 mmHg. Midline incision is made with 10  blade through subcutaneous tissue to the level of the extensor mechanism. A  fresh blade is used to make a medial parapatellar arthrotomy. Immediately  upon entering the joint, we encountered about 10 to 20 mL of murky appearing  fluid. Sent for stat Gram stain. Gram stain did not show organisms, did show  moderate white cells. There was another area where there was a pocket that  looked like pus. This was down in the proximal medial tibia. This had a  definite appearance of infection. This was sent for Gram stain and again no  organisms but moderate white cells. The synovium is debrided and we sent  that for frozen section and it did show high levels of acute inflammation.  We then removed the tibial polyethylene. It was not damaged.  I then  disrupted the interface between the femoral component and bone and easily  removed the femoral component with minimal bone loss. There was some  abnormal appearing tissue up underneath the femoral component and that is  also sent for frozen. Given that the initial frozen section had shown acute  inflammation, the pathologist did not freeze the second specimen. We then  removed the tibial component by disrupting interface between the component  and bone. There was a pocket of pus in the proximal lateral tibia which had  eroded all the cancellus  bone and all that was left proximally and laterally  was the cortical shell going down to the metaphyseal area. I removed the  membrane and the fluid. This did leave healthy-appearing cortical bone. The  cancellus bone was still and intact centrally and medially.  I used the  oscillating saw to remove any residual cement from the tibial surface and to  get down to a normal appearing and tibial surface. The cement from the  tibial keel was also removed. I then reamed both the tibial and medullary  canals after thoroughly irrigating them. We reamed up to about 18 mm to  remove any debris from the canals. Pulsatile lavage was then used to  irrigate approximately 3 liters of saline solution through the joint and  especially through the medullary canal then cut bone surfaces. I also had  removed the patellar component using an oscillating saw  and the patella bone  was intact. We then mixed three batches of cement and 3 grams vancomycin and  then 3.6 grams of tobramycin. I created a cement spacer to keep the knee in  extension. Once the cement was hard, it was passed into the joint. The  extensor mechanism then closed over Hemovac drain with interrupted #1 PDS.  Tourniquet was then released with total time of 62 minutes. Subcu was closed  with interrupted 2-0 Vicryl and skin with staples. The drains hooked to  suction and the catheter for Marcaine pain pump was placed and the pump  initiated. A bulky sterile dressing was applied and she was placed into a  knee immobilizer, awakened and transferred to recovery in stable condition.      Ollen Gross, M.D.  Electronically Signed     FA/MEDQ  D:  09/16/2005  T:  09/16/2005  Job:  [id number]

## 2011-01-16 NOTE — Cardiovascular Report (Signed)
[place]  Patient:    [name], [name] Visit Number: [id number] MRN: [id number]          Service Type: MED Location: 1W 0158 01 Attending Physician:  Talitha Givens Dictated by:   Daisey Must, M.D. [facility] Proc. Date: 10/19/01 Admit Date:  10/15/2001 Discharge Date: 10/18/2001   CC:         Luis Abed, M.D. [facility]             Barry Dienes. Eloise Harman, M.D.             Cardiac Catheterization Lab                        Cardiac Catheterization  PROCEDURE: 1. Percutaneous transluminal coronary angioplasty with stent placement in    the first obtuse marginal branch. 2. Percutaneous transluminal coronary angioplasty utilizing cutting balloon    in proximal left anterior descending artery.  CARDIOLOGIST:  Daisey Must, M.D. [facility]  INDICATION:  [name] is a 77 year old woman who presented to the hospital four days ago with chest pain and ruled in for non-Q-wave myocardial infarction.  She underwent cardiac catheterization yesterday by Dr. Andee Lineman which revealed the presence of a 90% stenosis in the first obtuse marginal branch and a complex 80% stenosis in the proximal LAD just after the bifurcation of a large first diagonal branch.  After review of the images, we opted to proceed with percutaneous intervention.  PROCEDURAL NOTE:  A 7-French sheath was placed in the right femoral artery. The patient was continued on Integrilin which had been started shortly after hospital admission.  Heparin was administered to maintain an ACT of greater than 200 seconds.  At the beginning of the procedure, the patient had a vagal reaction with hypotension and bradycardia.  This resolved with the administration of 0.5 mg intravenous atropine.  We then proceeded with percutaneous intervention.  We used a 7-French Voda left 4 guiding catheter and a BMW wire.  We initially treated the obtuse marginal branch.  The BMW wire was advanced beyond the lesion into  the distal vessel.  The lesion in the obtuse marginal was then dilated with a 2.25 x 20 mm CrossSail balloon inflated to 8 atmospheres. There was significant residual stenosis and, therefore, we deployed a 2.25 x 28 mm Pixel stent across the entire length of disease in the first marginal at a deployment pressure of 12 atmospheres.  Angiographic images at that point revealed patency of the obtuse marginal with 0% residual stenosis and TIMI-3 flow.  We then redirected our BMW wire down the left anterior descending artery. PTCA of the lesion in the LAD was performed with a 3.0 x 10 mm cutting balloon inflated to 8, 10, and the 10 atmospheres sequentially.  We then went back with a 3.25 x 12 mm Quantum balloon, inflating it to 16 atmospheres for two inflations.  Final angiographic images reveal patency of the proximal LAD with 20% residual stenosis and TIMI-3 flow.  COMPLICATIONS:  None.  RESULTS: 1. Successful PTCA with stent placement in the first obtuse marginal branch,    reducing a 90% stenosis to 0% residual with TIMI-3 flow. 2. Successful PTCA utilizing cutting balloon in the proximal left anterior    descending artery, reducing an 80% stenosis to 20% residual with TIMI-3    flow.  PLAN:  Integrilin will be continued for an additional 18 hours.  Plavix will be administered for recommended 6 to 12  months. Dictated by:   Daisey Must, M.D. [facility] Attending Physician:  Talitha Givens DD:  10/19/01 TD:  10/19/01 Job: [id number]

## 2011-01-16 NOTE — Op Note (Signed)
NAME:  [name], [name]                         ACCOUNT NO.:  [id number]   MEDICAL RECORD NO.:  [id number]                   PATIENT TYPE:  AMB   LOCATION:  NESC                                 FACILITY:  [facility]   PHYSICIAN:  Anselm Pancoast. Zachery Dakins, M.D.          DATE OF BIRTH:  October 13, 1929   DATE OF PROCEDURE:  04/05/2003  DATE OF DISCHARGE:                                 OPERATIVE REPORT   PREOPERATIVE DIAGNOSES:  1. Rule out temporal arteritis.  2. Elevated sed rate.  3. Left neck musculoskeletal pain.   POSTOPERATIVE DIAGNOSES:   OPERATION:  Left temporal artery biopsy.   ANESTHESIA:  Local at [facility].   HISTORY:  The patient is a 77 year old female who presented to the emergency  room; was kind of vague.  Posterior left neck and headaches.  Had an  elevated sed rate and was thought possibly to have temporal arteritis.  She  was started on steroids and then saw her regular doctor (Dr. Timothy Lasso), who  questioned this and asked that she have a temporal artery biopsy.  I saw her  in the office yesterday; she has been on steroids now for probably five or  six days.  Her symptoms are not really focal left headaches, but kind of  pain in the posterior left neck, kind of shoulder discomfort and appeared  more kind of musculoskeletal to me than the typical temporal arteritis.  Because of elevated sed rate I was in agreement to proceed with a left  temporal artery biopsy; she is here today for the planned procedure.   DESCRIPTION OF PROCEDURE:  The patient was taken to the operative suite.  We  had marked the left side and then shaved the hair at the temporal area.  Prepped her first with alcohol and then Betadine solution.  She has a Latex  allergy and we used the Latex-free gloves.  Draped the area with sterile  towels, and then anesthetized the area about 0.5 cm right in front of the  inch, up into the temporal scalp area; made an incision probably about 1-1/2  inches in  length.  This was taken down into subcutaneous tissue.  The left  temporal artery branch was identified, and I elevated this and was able to  remove a segment (probably about 1.5 inch in length).  The artery itself was  fairly marked fibrosis, but it did not appear to be a giant cell temporal  arteritis grossly.  The proximal temporal artery was ligated with 4-0  Vicryl, and then a couple of tributaries were ligated distally.  The segment  was sent for permanent exam.  The subcutaneous was reapproximated with a  couple stitches of 4-0 Vicryl, and then 4-0 nylon simple skin stitches.  The  patient tolerated the procedure well, without significant discomfort.  She  already has pain medication that she was given, which she can continue.  I  will see  her back in the office for suture removal in approximately 7-10  days.   Await the final pathology on the steroids to be continued, tapered or  decreased, or stopped. This will be left to Dr. Timothy Lasso.                                               Anselm Pancoast. Zachery Dakins, M.D.    WJW/MEDQ  D:  04/05/2003  T:  04/05/2003  Job:  [id number]   cc:   Gwen Pounds, M.D.  [address]  [place]  [place] [address]  Fax: [phone]

## 2011-01-16 NOTE — Op Note (Signed)
NAMEJACQUILYN, [name]               ACCOUNT NO.:  [id number]   MEDICAL RECORD NO.:  [id number]          PATIENT TYPE:  INP   LOCATION:  1516                         FACILITY:  [facility]   PHYSICIAN:  Ollen Gross, M.D.    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   POSTOPERATIVE DIAGNOSIS:  Right total knee infection now status post  resection arthroplasty.   PROCEDURE:  Right total knee arthroplasty reimplantation.   SURGEON:  Ollen Gross, M.D.   ASSISTANT:  Alexzandrew L. Julien Girt, P.A.   ANESTHESIA:  General with postop Marcaine pain pump.   ESTIMATED BLOOD LOSS:  200.   DRAINS:  Hemovac x1.   TOURNIQUET TIME:  54 minutes at 300 mmHg then down 8 minutes and up an  additional 26 minutes at 300 mmHg.   COMPLICATIONS:  None.   CONDITION:  Stable to recovery.   CLINICAL NOTE:  [name] is a 77 year old female who had a resection  arthroplasty done approximately 6 weeks ago secondary to infection. She was  treated with antibiotic impregnated spacer and IV antibiotics. She presents  now for reimplantation of total knee.   PROCEDURE IN DETAIL:  After successful administration of general anesthetic,  a tourniquet was placed high on the right thigh and right lower extremity  prepped and draped in the usual sterile fashion. Extremities wrapped in  Esmarch, tourniquet inflated to 300 mmHg. A midline incision was made with a  10 blade through the subcutaneous tissue to the level of the extensor  mechanism. A fresh blade was used to make a medial parapatellar arthrotomy  and then soft tissue over the proximal medial tibia subperiosteally elevated  to the joint line with a knife and into the semimembranosus bursa with a  Cobb elevator. The fluid that was encountered had a clear appearance. No  evidence of any infected appearing fluid. The overall appearance of the  joint was tremendously improved compared to her previous operation. We did  send frozen section which showed no acute inflammation. We she had sent  Gram's stain which was negative. I removed the antibiotic spacer piecemeal.  Once it was fully out then I was able to flex the knee. I had to do a  quadriceps snip to evert the patella. The soft tissue overlying the cut bony  surfaces were removed with the rongeur. I was then able to remove cement  from the tibial canal. We reamed the tibial and femoral canals after  thoroughly irrigating. On the femoral side it was up 22 mm, tibial side 16  mm. The 60 mm reamer was left in the tibia and then we placed the  intramedullary tibial cutting guide. I removed about 2 mm of bone all around  the tibia. We had a great platform of healthy-appearing bone. The size 4 was  the most appropriate tibial tray and we prepared with the modular conical  reamer. On the femoral side, we had  a 22 mm reamer intact and then placed  the 5 degree right valgus alignment guide. We resected 4 mm off of the  medial side and in order to get any bone I had to go up 8 mm on the lateral.  I thus used a 4-mm augment medially, 8-mm augment laterally on the distal  bone. We then placed the size 5 cutting block as 5 was the most appropriate  for the femur. It was in the +2 position to effectively raise the stem and  lower the femoral component. Anterior and posterior cuts are made. We then  placed a revision block to make the chamfer cuts and the intercondylar box  cut for a TC3 femur.   We placed the trials, a size 4 MBT revision tray with a 13 x 60 stem  extension and on the femoral side a size 5 TC3 posterior stabilized femur  with an 8 mm lateral distal augment, 4 mm distal medial augment, 4 mm  posterior medial augment and 22 x 75 stem extension. We were able to get a  10 mm posterior stabilized RP TC3  insert with full extension, excellent  varus and valgus balance throughout full range of motion. I then freshened  up the patella cut to take the thickness to approximately 13 mm. We then  placed a 38 template, drilled the lug holes, placed a trial patella. The  tourniquet was then let down for a total of 54 minutes. Minor bleeding is  stopped with cautery. We assembled the components on the back table, the  tourniquet was down for 8 minutes. We then rewrapped the leg in Esmarch and  reinflated the tourniquet. We trialed the cement restrictor on the tibial  side and it was a size 5. It was then placed at the appropriate depth in the  tibial canal. Pulsatile lavage was used to thoroughly irrigate the tibia and  femur. We then mixed the cement, three batches of cement with  2 grams of  vancomycin. It was injected into the tibial canal and pressurized. The  tibial component is cemented into place and all extruded cement removed. We  Press-Fit the femoral stem and cemented distally. The 10 mm spacer is  placed,  knee held in full extension and all extruded cement removed. We  also cemented the 38 patella in place and held it with the patella clamp.  Once the cement was fully hardened, then the permanent TC3 RP 10 mm size 5  tibial insert is placed into the tibial tray. The knee is reduced and there  is excellent stability throughout full range of motion. The wound was  copiously irrigated with saline solution. The quadriceps snip is closed with  interrupted #1 PDS. Flexion against gravity was about 90 degrees. The  extensor mechanism closed a over Hemovac drain with #1 PDS. The tourniquet  is released for the second tourniquet time of about 26 minutes. The subcu is  closed with interrupted 2-0 Vicryl and skin with staples. The catheter for  the Marcaine pain pump is placed and the pump initiated. The Hemovac is hooked to suction and a bulky sterile dressing is applied. She is awakened  and  transported to recovery in stable condition.      Ollen Gross, M.D.  Electronically Signed     FA/MEDQ  D:  11/11/2005  T:  11/13/2005  Job:  [id number]

## 2011-01-16 NOTE — Cardiovascular Report (Signed)
NAME:  [name], [name]                         ACCOUNT NO.:  [id number]   MEDICAL RECORD NO.:  [id number]                   PATIENT TYPE:  OIB   LOCATION:  6598                                 FACILITY:  [facility]   PHYSICIAN:  Vida Roller, M.D.                DATE OF BIRTH:  1929/12/05   DATE OF PROCEDURE:  04/03/2004  DATE OF DISCHARGE:                              CARDIAC CATHETERIZATION   Primary is Barry Dienes. Eloise Harman, M.D.  Cardiologist is Learta Codding, M.D.  I  have left messages with both of them regarding the results of this  procedure.   PROCEDURES PERFORMED:  1. Left heart catheterization.  2. Selective coronary angiography.  3. Left ventriculography.   REASON FOR THE EVALUATION:  This is a 77 year old African-American woman who  has hypertension and hyperlipidemia as well as known coronary artery disease  and diabetes.  She is status post revascularization of her left anterior  descending and first obtuse marginal by Dr. Gerri Spore back in 2003.  She  presents with anterior chest discomfort, had a perfusion study which showed  some peri-infarction ischemia, and the recommendation was for angiography.   DETAILS OF THE PROCEDURE:  After obtaining informed consent, the patient was  brought to the cardiac catheterization laboratory in the fasting state.  There she was prepped and draped in the usual sterile manner and local  anesthetic was obtained over the right groin using 1% lidocaine without  epinephrine.  The right femoral artery was cannulated using a 4 French 10 cm  sheath in the modified Seldinger technique and left heart catheterization  was performed using a 4 French Judkins left #4, a 4 French Judkins right #4,  a 4 Jamaica Champ right coronary catheter, a 4 Jamaica non-torquing right  coronary catheter, and a pigtail catheter.  The Champ right catheter  cannulated the right coronary artery.  The Judkins left cannulated the left  coronary artery.  The pigtail  catheter was used for left ventriculography,  which was imaged in the RAO projection 30 degrees with the power injector.  At the conclusion of the procedure the catheters were removed.  The patient  was moved back to the cardiology holding area, where the femoral artery  sheath was removed.  Hemostasis was obtained using direct manual pressure.  At the conclusion of the hold there was no evidence of hematoma formation.  Distal pulses were intact.  Total fluoroscopic time was 11.2 minutes.  Total  iodinized contrast was 150 mL.   RESULTS:  1. Aortic pressure 165/78 with a mean arterial pressure of 111.  2. LV pressure 177/15 with an end-diastolic pressure of 26 mmHg.  3. There was about a 10 mm gradient across the aortic valve on pullback.   CORONARY ANGIOGRAPHY:  1. The left main coronary artery was a large vessel, which is     angiographically unremarkable.  2. The left anterior descending coronary artery  is a moderate-caliber vessel     which has some luminal irregularities along its course but no significant     obstruction.  There are two diagonals, both of which are moderate     caliber, free of disease.  3. The left circumflex coronary artery has luminal irregularities in its     proximal portion.  There is a large first obtuse marginal which has a     patent stent in its proximal portion.  There is no significant in-stent     restenosis.  The distal portion of the circumflex in the AV groove has     significant diffuse disease at a bifurcation with a small obtuse     marginal.  4. The right coronary artery is a large, dominant vessel which has diffuse     disease in its mid- to distal portion.  There is disease involving the     ostium of the PDA.  Most significantly, the distal portion of the artery     has about a 70% very long stenosis with a 50% lesion in the midportion.     The posterior descending is a small artery.   Left ventriculogram reveals systolic function of  about 60% with no wall  motion abnormalities and no mitral regurgitation.   RECOMMENDATIONS:  We will review these films with the interventionist  available.  My sense is that she probably benefits from medical therapy.  She needs aggressive blood pressure control, and she probably needs the  addition of an ACE inhibitor as well as more intensive antianginal therapy.                                               Vida Roller, M.D.    JH/MEDQ  D:  04/03/2004  T:  04/04/2004  Job:  [id number]   cc:   Barry Dienes. Eloise Harman, M.D.  [address]  [place]  [place] [address]  Fax: [phone]   Learta Codding, M.D. [facility]

## 2011-01-16 NOTE — Discharge Summary (Signed)
[place]  Patient:    [name], [name] Visit Number: [id number] MRN: [id number]          Service Type: MED Location: 1W 0158 01 Attending Physician:  Talitha Givens Dictated by:   Rozell Searing, P.A.-C. Admit Date:  10/15/2001 Discharge Date: 10/18/2001                    Referring Physician Discharge [place]  PROCEDURES:  Coronary angiogram/stent OM-1 and "cutting balloon" PTCA, LAD, October 18, 2001.  REASON FOR ADMISSION:  Ms. Albin is a 77 year old female, with reported history of remote coronary artery disease treated with PCI, and multiple cardiac risk factors notable for history of dyslipidemia, hypertension, and obesity, who initially presented to [facility] with symptoms suggestive of acute coronary syndrome.  She reported experiencing left chest discomfort after having had a large meal some 15 minutes earlier.  She reported associated dyspnea, diaphoresis, nausea, and vomiting.  On presentation to the emergency room, she was treated with nitroglycerin and GI cocktail both, more relief with the GI cocktail.  No acute changes have been noted by EKG.  However, initial cardiac enzymes were elevated with a troponin I of 0.53 and an MB of 12.5.  LABORATORY DATA:  Cardiac enzymes:  Peak CPK 1006/155.7 (15.5%); peak troponin I 24.84.  Lipid profile:  Total cholesterol 274, triglycerides 91, HDL 66, LDL 190 (ration 4.2).  CSH 1.52.  Iron profile, normal iron 49, mildly decreased TIBC 244, normal percent saturations 20, elevated ferritin 361.  Urine culture negative.  WBC 7.6, hemoglobin 11.8, hematocrit 36, platelets 280 on admission.  Sodium 139, potassium 3.8, glucose 137, BUN 15, creatinine 0.9 on admission.  Normal liver enzymes on admission.  Hemoglobin A1c 6.6.  Admission CXR:  NAD  HOSPITAL COURSE: #1 - CARDIAC:  Patient ruled in for non-Q-wave myocardial infarction with peak CPK 1006/155.7 (15.5%) and peak troponin I  of 24.84.  She was stabilized on intravenous heparin and Integrilin, aspirin, and beta blocker.  Of note, patient had reported possible history of allergy to aspirin (citing palpitations), however, she was able to tolerate this throughout her hospitalization.  Following stabilization over the weekend, arrangements were made for patient to proceed with coronary angiogram.  Cardiac catheterization, performed October 18, 2001, by Dr. Andee Lineman (see cath report for full details), revealed severe two-vessel coronary artery disease with normal left ventricular function.  Specifically, there was 20-30% focal, distal LMCA; diffuse, proximal 80-90% and 30-40% mid LAD; 50% proximal, 80% distal OM-1; 60% distal CFX; 30% mid, 40-50% distal RCA.  Left ventriculogram was normal (EF 60%).  The patient returned the following day for elective PCI with successful stenting of the 90% OM-1 lesion to 0% residual stenosis and "cutting balloon" PTCA of the 80% proximal LAD lesion to 20% residual stenosis.  The patient was placed back on both heparin and Integrilin postintervention.  No complications were noted.  Integrilin was resumed for 18 hours postprocedure, and recommendation by Dr. Gerri Spore was to continue Plavix for 6-12 months.  #2 - METABOLIC:  The patient did report history of dyslipidemia but was not on any cholesterol-lowering agents on admission.  Of note, at time of discharge, she reported intolerance to Zocor, citing lower extremity cramping, and a decision was made at time of discharge to start Pravachol and monitor this closely.  Lipid profile showed markedly elevated indices (see lab data) and patient was started on Zocor 20 for her markedly elevated lipid indices (see lab  data). However, at time of discharge, she reported having had prior intolerance to this medication due to lower extremity cramping.  This was changed to Pravachol at time of discharge.  We will monitor this  closely.  The patient also noted to have microcytic anemia.  Iron profile, however, revealed normal iron and percent saturation levels.  She was placed on Niferex during this admission.  She reports having seen Dr. Vida Rigger in the past and described what sounded like a barium swallow procedure - she reports having been diagnoses with hiatal hernia.  However, she does not recall having undergone either esophagogastroduodenoscopy or colonoscopy.  She has been advised to have anemia work-up when she returns to her primary care physician, Dr. Eloise Harman, in follow-up.  The patient also had low-grade fever which was felt to be secondary to her non-Q-wave MI.  Urine culture was negative.  Antibiotics were not administered.  The patient also has probable new onset of type 2 diabetes mellitus. Hemoglobin A1c was 6.6.  She was not on any diabetic medications on admission, and this will also need subsequent further evaluation and management at time of follow-up.  The patient also reported developing a fine papular, pleuritic rash at time of discharge.  She feels quite sure that this is due to latex, which she has reported being allergic to in the past.  She was treated with topical steroid cream and Benadryl, and we will monitor this closely as well, particularly to see if this may be a reaction to Plavix.  DISCHARGE MEDICATIONS: 1. Plavix 75 mg q.d. (6-12 months). 2. Coated aspirin 81 mg q.d. 3. Pravachol 40 mg q.h.s. 4. Toprol XL 50 mg q.d. 5. Benadryl 25 mg q.6h. p.r.n. 6. Niferex 150 mg b.i.d. 7. Altace 5 mg q.d. 8. Nitrostat 0.4 mg p.r.n.  INSTRUCTIONS:  No strenuous activity, driving, or working until see by physician; low fat/cholesterol and diabetic diet; the patient is to call the office if there is any swelling/bleeding of the groin.  FOLLOW-UP:  Patient will follow up with Gene Serpe, P.A.-C. in approximately two weeks - office will call with appointment.  She was  subsequently returned to Dr. Lovena Neighbours for cardiac follow-up.  The patient was instructed to follow up with her primary care physician,  Dr. Jarome Matin, in the following 1-2 weeks for evaluation and management of microcytic anemia and new onset type 2 diabetes mellitus.  The patient reports having seen Dr. Vida Rigger in the past.  DISCHARGE DIAGNOSES: 1. Acute coronary syndrome.    a. Non-Q-wave myocardial infarction/two-vessel coronary artery disease.    b. Status post stent 90% obtuse marginal 1 - "cutting balloon" PTCA 80%       left anterior descending, October 19, 2001.    c. Normal left ventricular function.    d. History of remote PCI. 2. Dyslipidemia. 3. Microcytic anemia. 4. Pleuritic rash. 5. Hypertension. 6. Obesity. 7. Low-grade fever. 8. Bilateral femoral bruits. 9. Type 2 diabetes mellitus (new onset). Dictated by:   Rozell Searing, P.A.-C. Attending Physician:  Talitha Givens DD:  10/20/01 TD:  10/20/01 Job: [id number]

## 2011-01-16 NOTE — Cardiovascular Report (Signed)
NAME:  [name], [name]                         ACCOUNT NO.:  [id number]   MEDICAL RECORD NO.:  [id number]                   PATIENT TYPE:  OIB   LOCATION:  6525                                 FACILITY:  [facility]   PHYSICIAN:  Charlies Constable, M.D. [facility]              DATE OF BIRTH:  1929-09-15   DATE OF PROCEDURE:  04/08/2004  DATE OF DISCHARGE:                              CARDIAC CATHETERIZATION   CLINICAL HISTORY:  Ms. Samet is 77 years old and has documented coronary  disease.  She had a cutting balloon angioplasty of the LAD and stenting of  circumflex marginal vessel by Dr. Gerri Spore in 2003.  She developed  recurrent chest pain and was studied in the outpatient laboratory by Dr.  Dorethea Clan and found to have tight lesions in the distal cervical spine and  distal right coronary artery.  She was brought back today for intervention.   PROCEDURE:  The procedure was followed with right femoral artery and  arterial sheath and 6 French CLS 3.5 guiding catheter with side holes.  Front wall arterial function was performed and Omnipaque contrast was used.  The patient was given weighted adjusted heparin and found AC greater than  200 seconds and was given double bolus Integrilin infusion.  We passed a  wire down to the distal circumflex artery and crossed the lesion using an  Asahi soft wire.  We then predilated with a 2.25 x 20 mm Maverick  balloon  performing two inflations of 8 atmospheres for 30 seconds.  We then deployed  a 2.25 x 20 mm Express stent, deploying this with one inflation of 12  atmospheres for 30 seconds.  We then post dilated with a 2.5 x 15 mm Quantum  Maverick performing two inflations up to 14 atmospheres for 30 seconds.   We then approached the right coronary artery.  We used the JR4 6 Jamaica  guiding catheter with side holes and a new Asahi soft wire.  We crossed the  lesion in the distal right coronary with some difficulty with the wire.  We  predilated with the same 2.25  x 20 mm Maverick balloon performing two  inflations up to 15 atmospheres before we expanded the waist in the balloon.  We then deployed a 2.5 x 24 mm TAXUS stent, deploying this with one  inflation of 14 atmospheres for 30 seconds.  This did not completely expand  the stent.  We ten went in with a 2.75 x 20 mm Quantum Maverick and  performed two inflations.  We had to go up to 22 atmospheres before we  eliminated the waist in the balloon.  Repeat diagnostic examination  performed through the guiding catheter.   A distal aortogram was performed at the end of the procedure to evaluate the  patient for renovascular causes for hypertension.  The right femoral artery  was closed with AngioSeal at the end of the procedure.  RESULTS:  Initially there was stenosis in the distal circumflex artery that  was estimated at 90%.  Following stenting with a bare metal Express stent,  the stenosis improved from 90% to 0%.   Initially the stenosis in the distal right coronary artery was estimated at  80%.  Following stenting with a TAXUS drug eluting stent, the stenosis  improved to 0%.   The abdominal angiogram showed no evidence of renal artery stenosis and no  abdominal aortic aneurysm.   CONCLUSION:  1. Successful stenting of the lesion in the distal circumflex artery using     an Express bare metal stent with improvement of central narrowing from     90% to 0%.  2. Successful stenting of the lesion in the distal right coronary artery     using a TAXUS drug eluting stent with improvement of central narrowing     from 80% to 0%.   DISPOSITION:  The patient is returned to __________ for further observation.                                               Charlies Constable, M.D. [facility]    BB/MEDQ  D:  04/08/2004  T:  04/09/2004  Job:  [id number]   cc:   Barry Dienes. Eloise Harman, M.D.  [address]  [place]  [place] [address]  Fax: [phone]   Learta Codding, M.D. [facility]

## 2011-01-16 NOTE — Discharge Summary (Signed)
[name], [name]               ACCOUNT NO.:  [id number]   MEDICAL RECORD NO.:  [id number]          PATIENT TYPE:  INP   LOCATION:  1516                         [facility]AC[facility]   PHYSICIAN:  Barry Dienes. Eloise Harman, M.D.DATE OF BIRTH:  21-Mar-1930   DATE OF ADMISSION:  11/11/2005  DATE OF DISCHARGE:                                 DISCHARGE SUMMARY   REQUESTING PHYSICIAN:  Ollen Gross, M.D.   REASON FOR CONSULTATION:  Management of diabetes mellitus, type 2 and  coronary artery disease.   HISTORY OF PRESENT ILLNESS:  The patient is a 77 year old black female who  is well known to me. She is status post a November 10, 2005 right knee  resection arthroplasty and right total knee arthroplasty reimplantation. The  procedure went well. She had blood loss anemia requiring a transfusion of 2  units of packed red blood cells. Currently she feels okay and has mild to  moderate right knee pain (intensity 4-5/10) and mild constipation. She has  been walking with a walker and assistance where on the ward.   PAST MEDICAL HISTORY:  Hypertension, December 2001 pancreatitis, coronary  artery disease status post 2003 non-Q-wave myocardial infarction and several  stent procedures, diabetes mellitus, type 2, hyperlipidemia, headaches with  an increased erythrocyte sedimentation rate and temporal artery biopsy  normal, osteoarthritis of the knees July 2006, episode of syncope with  subsequent cardiac evaluation unremarkable, gastroesophageal reflux disease.  Mild diverticulosis, chronic anemia with low, MCV and July 2004 colonoscopy  showing internal and external hemorrhoids with diverticulitis and 2003 upper  GI showing small hiatal hernia with gastroesophageal reflux but no  stricture.   MEDICATIONS PRIOR TO ADMISSION:  1.  Atenolol 50 mg p.o. daily.  2.  Zocor 40 mg p.o. daily.  3.  Nitroglycerin 0.4 mg sublingual p.r.n. chest pain.  4.  Amaryl 2 mg p.o. daily.  5.  Flonase 2 sprays each nostril  once daily p.r.n. rhinitis.  6.  Aspirin 81 mg daily.  7.  Plavix 75 mg daily.  8.  Vicodin 5/500 1 tablet p.o. t.i.d. p.r.n. pain.  9.  Senna-S 2 tablets p.o. daily p.r.n. constipation.   ALLERGIES:  LATEX, ACE INHIBITORS have caused cough.   PAST SURGICAL HISTORY:  Bilateral tubal ligation, total abdominal  hysterectomy and bilateral salpingo-oophorectomy, vocal cord polyp resection  in 1997, 2003 right total knee replacement. Left anterior descending artery  atherectomy and circumflex artery PTCA and stent, August 2005. PTCA with  stenting of the circumflex and right coronary artery and laser treatment to  the right eye. September 16, 2005 right total knee resection arthroplasty with  intraoperative antibiotic spacer placement.   FAMILY HISTORY:  Father died at age 61 of complications of diabetes  mellitus, type 2. Mother died at age 26 of acute renal failure. There is no  family history of close relatives with early heart disease, colon cancer, or  breast cancer.   SOCIAL HISTORY:  She is a widow and is retired with no history of recent  tobacco use or alcohol abuse. She quit smoking in 1980.   REVIEW OF SYSTEMS:  She had some decreased appetite lately and a dry cough.  She denies shortness of breath, chest pain, abdominal pain, nausea, dysuria,  frequency, anxiety, or depression.   PHYSICAL EXAMINATION:  VITAL SIGNS:  Blood pressure 134/77, pulse 68,  respirations 20, temperature 99.2, pulse oxygen saturation 98% on room air.  GENERAL:  She is an elderly black female who is in no apparent distress. She  had an occasional dry cough.  NECK:  Supple and without jugular venous distension or carotid bruits.  CHEST:  Clear to auscultation.  HEART:  Had a regular rate and rhythm. S1 and S2 were present with a  systolic ejection murmur of grade 2/6.  ABDOMEN:  Abdomen had normal bowel sounds and no hepatosplenomegaly or  tenderness.  EXTREMITIES:  Had trace right ankle edema.   NEUROLOGICAL:  Exam was nonfocal.   LABORATORY STUDIES:  White blood cell count 7, hemoglobin 8.3, hematocrit  25.6, platelets 172. Serum sodium 137, potassium 4.1, chloride 108, carbon  dioxide 28, BUN 7, creatinine 0.8, glucose 133. Most recent hemoglobin A1c  level in September 2006 was 6.2%   IMPRESSION/PLAN:  1.  Diabetes mellitus, type 2:  This has been well controlled in the past.      Given that her food intake is less than usual. We will start sliding      scale insulin a.c. and q.h.s. Her Amaryl 2 mg daily can be restarted      upon discharge from the hospital.  2.  Constipation:  Mild and likely due to her narcotic treatment for      postoperative pain. I will add scheduled MiraLax and Senna-S to her      regimen to normalize her bowel function.  3.  Coronary artery disease:  Stable on her current medical regimen.  4.  Anemia:  This is moderately severe and a chronic issue for her. It is      likely due to chronic esophagitis from known gastroesophageal reflux      disease. I will check her pretransfusion iron studies and initiate      empiric proton pump inhibitor treatment.           ______________________________  Barry Dienes. Eloise Harman, M.D.     DGP/MEDQ  D:  11/13/2005  T:  11/15/2005  Job:  [id number]   cc:   Petra Kuba, M.D.  Fax: [phone]   Learta Codding, M.D. [facility]  [address]  [address]  [place]  [place] [address]   Dr. Joseph Art, opthalmology

## 2011-01-16 NOTE — H&P (Signed)
[name], [name]               ACCOUNT NO.:  [id number]   MEDICAL RECORD NO.:  [id number]          PATIENT TYPE:  INP   LOCATION:  NA                           FACILITY:  [facility]   PHYSICIAN:  Ollen Gross, M.D.    DATE OF BIRTH:  [birth year]   DATE OF ADMISSION:  DATE OF DISCHARGE:                                HISTORY & PHYSICAL   DATE OF OFFICE VISIT/HISTORY AND PHYSICAL:  September 08, 2005.   DATE OF ADMISSION:  September 16, 2005.   CHIEF COMPLAINT:  Failed right total knee.   HISTORY OF PRESENT ILLNESS:  Patient is a 77 year old female who has been  seen by Dr. Despina Hick for ongoing painful right total knee.  She initially had  surgery done in May, 1997 by Dr. Jodi Geralds.  She has had pain since the  time of surgery.  She does not have any early postoperative infectious  symptoms.  The pain has progressively gotten worse over the past several  years.  She has a lot of stiffness, and this wants to give out on her.  She  is seen in the office.  Her x-rays show some osteolysis in the proximal  lateral tibia on the AP view.  There is concern of loosening under the  tibial tray.  She did have a bone scan which showed increased activity in  all three phases, consistent with probable infection.  There is a high  probability that she does have a prosthetic infection.  Risks and benefits  of the procedure have been discussed with the patient.  She is set up for  revision, total knee, versus resection arthroplasty.  Multiple cultures and  resections will be sent at the time of surgery to determine whether to treat  this with a one stage revision versus a two stage resection revision  procedure.   Risks and benefits have been discussed.  The patient is subsequently  admitted to the hospital.   ALLERGIES:  LATEX GLOVES.   CURRENT MEDICATIONS:  1.  Clopidogrel bisulfate 75 mg daily.  2.  Simvastatin 40 mg daily.  3.  Atenolol 50 mg daily in the evening.   PAST MEDICAL HISTORY:  1.   History of CVA in 1982.  2.  Hypertension.  3.  Coronary artery disease.  4.  History of myocardial infarction in 2002.  5.  Borderline diabetes, diet controlled.  6.  Arthritis.  7.  Aortic murmur.   PAST SURGICAL HISTORY:  Right total knee replacement arthroplasty in May,  1997.  She has also undergone cardiac catheterization with intervention and  stent placements in the circumflex coronary artery and the right coronary  artery in 2005.   FAMILY HISTORY:  Mother deceased at age 53 with heart disease.  Father is  deceased at 48 with diabetes.  Hypertension also runs in the family.   SOCIAL HISTORY:  Widowed.  Nonsmoker.  No alcohol.  Eight children.  A two  story home with five steps entering.   PHYSICAL EXAMINATION:  VITAL SIGNS:  Pulse 64, respirations 12, blood  pressure 150/72.  GENERAL:  Patient  is a 77 year old African-American female who is well-  developed and well-nourished in no acute distress.  Alert, oriented and  cooperative.  Very pleasant at the time of exam.  Slightly overweight.  She  is a good historian.  She is accompanied by her daughter.  HEENT:  Normocephalic and atraumatic.  Pupils are round and reactive.  She  is noted to wear glasses.  Oropharynx is clear.  EOMs are intact.  She does  have full upper and lower dentures noted.  NECK:  Supple.  CHEST:  Clear with some inspiratory crackles noted at the left base.  HEART:  Regular rate and rhythm with a grade 2/6 systolic ejection murmur  noted.  Best heard at the aortic point.  S1 and S2 noted.  ABDOMEN:  Soft, nontender.  Bowel sounds present.  Abdomen is round.  RECTAL/BREASTS/GENITALIA:  Not done.  Not pertinent to the present illness.  EXTREMITIES:  Right knee does show some edema noted in the lower leg with  some stasis skin changes.  Range of motion is about 55-90 degrees.  No  effusion.   IMPRESSION:  1.  Failed right total knee arthroplasty.  2.  History of cerebrovascular accident in 74.  3.   Hypertension.  4.  Coronary arterial disease.  5.  History of myocardial infarction in 2002.  6.  Status post cardiac catheterization with multiple stenting.  7.  Diet-controlled borderline diabetes.  8.  Arthritis.   PLAN:  Patient admitted to [facility] to undergo a revision total  knee arthroplasty versus a resection, right total knee arthroplasty with  placement of antibiotic spacer.  Surgery will be performed by Dr. Trudee Grip.  Her medical doctor is Dr. Jarold Motto.  Her cardiologist is Dr.  Andee Lineman.  Both will be notified of the room number on admission and will be  consulted to assist with medical management of the patient throughout the  hospital course.      Alexzandrew L. Julien Girt, P.A.      Ollen Gross, M.D.  Electronically Signed    ALP/MEDQ  D:  09/15/2005  T:  09/15/2005  Job:  [id number]   cc:   Learta Codding, M.D. [facility]  [address]  [address]  [place]  [place] [address]   Barry Dienes. Eloise Harman, M.D.  Fax: [phone]

## 2011-01-23 ENCOUNTER — Other Ambulatory Visit: Payer: Self-pay | Admitting: *Deleted

## 2011-01-23 MED ORDER — CARVEDILOL 6.25 MG PO TABS: 6.2500 mg | ORAL_TABLET | Freq: Two times a day (BID) | ORAL | Status: DC

## 2011-02-02 ENCOUNTER — Other Ambulatory Visit (INDEPENDENT_AMBULATORY_CARE_PROVIDER_SITE_OTHER): Payer: Medicare Other | Admitting: *Deleted

## 2011-02-02 DIAGNOSIS — E785 Hyperlipidemia, unspecified: Secondary | ICD-10-CM

## 2011-02-02 DIAGNOSIS — I10 Essential (primary) hypertension: Secondary | ICD-10-CM

## 2011-02-02 LAB — HEPATIC FUNCTION PANEL
AST: 29 U/L (ref 0–37)
Albumin: 3.8 g/dL (ref 3.5–5.2)
Alkaline Phosphatase: 69 U/L (ref 39–117)

## 2011-02-02 LAB — LIPID PANEL
Total CHOL/HDL Ratio: 3
Triglycerides: 85 mg/dL (ref 0.0–149.0)

## 2011-02-02 LAB — BASIC METABOLIC PANEL
CO2: 32 mEq/L (ref 19–32)
Calcium: 10 mg/dL (ref 8.4–10.5)
Creatinine, Ser: 1.2 mg/dL (ref 0.4–1.2)

## 2011-02-03 ENCOUNTER — Telehealth: Payer: Self-pay | Admitting: Cardiology

## 2011-02-03 DIAGNOSIS — E876 Hypokalemia: Secondary | ICD-10-CM

## 2011-02-03 MED ORDER — POTASSIUM CHLORIDE CRYS ER 20 MEQ PO TBCR: 20.0000 meq | EXTENDED_RELEASE_TABLET | Freq: Every day | ORAL | Status: DC

## 2011-02-03 NOTE — Telephone Encounter (Signed)
Patient will start Potassium 20 meq daily. Prescription called into her pharmacy.

## 2011-03-03 ENCOUNTER — Encounter: Payer: Self-pay | Admitting: Cardiovascular Disease

## 2011-04-15 ENCOUNTER — Encounter: Payer: Self-pay | Admitting: Cardiovascular Disease

## 2011-04-15 ENCOUNTER — Ambulatory Visit (INDEPENDENT_AMBULATORY_CARE_PROVIDER_SITE_OTHER): Payer: Medicare Other | Admitting: Cardiovascular Disease

## 2011-04-15 VITALS — BP 140/80 | HR 62 | Ht 68.5 in | Wt 240.0 lb

## 2011-04-15 DIAGNOSIS — I359 Nonrheumatic aortic valve disorder, unspecified: Secondary | ICD-10-CM

## 2011-04-15 DIAGNOSIS — I2510 Atherosclerotic heart disease of native coronary artery without angina pectoris: Secondary | ICD-10-CM

## 2011-04-15 NOTE — Patient Instructions (Signed)
Your physician recommends that you schedule a follow-up appointment in: 6 months  Your physician has requested that you have an echocardiogram. Echocardiography is a painless test that uses sound waves to create images of your heart. It provides your doctor with information about the size and shape of your heart and how well your heart's chambers and valves are working. This procedure takes approximately one hour. There are no restrictions for this procedure. In 2-3 months.

## 2011-04-15 NOTE — Assessment & Plan Note (Addendum)
Stable. No changes. Continue current therapy.

## 2011-04-15 NOTE — Assessment & Plan Note (Signed)
Moderate AS by echo October 2011. Will repeat echo this October. Asymptomatic.

## 2011-04-15 NOTE — Progress Notes (Signed)
History of Present Illness: [name] is a pleasant 77 year old African American female with a past medical history significant for coronary artery disease, status post placement of stents in the circumflex and right coronary arteries in 2005 by Dr. Juanda Chance, as well as hypertension, moderate AS, hyperlipidemia, obesity, and diabetes mellitus and high grade AV block now s/p PPM, who comes in today for routine cardiac followup. She had a pacemaker placed by Dr. Johney Frame in May 2011 for second degree AV block. Most recent echo in October 2011 with Normal LV size and function with moderate to severe aortic stenosis. Recent carotid artery dopplers October 2011 with mild bilateral disease.   No chest pain or SOB. Mild swelling in left lower extremity, chronic and resolves at night. No near syncope or syncope. She did pull a muscle in her left arm in the yard.     Past Medical History  Diagnosis Date  . DM (diabetes mellitus)   . Hyperlipidemia   . HTN (hypertension)   . CAD (coronary artery disease)     s/p NSTEMI 2003 with stent placement OM1.  PTCA with cutting balloon prox LAD. 2005 admission with chest pain, BMS placed to CFX and DES placed in RCA  . Aortic stenosis     moderate to severe  . MR (mental retardation)     mild  . Anemia   . Syncope     recurrent; due to symptomatic bradycardia; s/p PPM  . CRI (chronic renal insufficiency)   . CVA (cerebral infarction)     remote; recovered  . AV block     s/p PPM 12/2009  . Hx of colonoscopy     Past Surgical History  Procedure Date  . Hysterectomy   . Benign vocal cord tumor removed   . Right knee surgery     x 3 with TKA  . Sjm ppm 12/2009    Current Outpatient Prescriptions  Medication Sig Dispense Refill  . aspirin 81 MG tablet Take 81 mg by mouth daily.        . carvedilol (COREG) 6.25 MG tablet Take 1 tablet (6.25 mg total) by mouth 2 (two) times daily.  60 tablet  6  . clopidogrel (PLAVIX) 75 MG tablet Take 75 mg by mouth  daily.        . nitroGLYCERIN (NITROSTAT) 0.4 MG SL tablet Place 0.4 mg under the tongue every 5 (five) minutes as needed.        . rosuvastatin (CRESTOR) 10 MG tablet Take 10 mg by mouth daily.          Allergies  Allergen Reactions  . Latex     History   Social History  . Marital Status: Widowed    Spouse Name: N/A    Number of Children: 8  . Years of Education: N/A   Occupational History  . Not on file.   Social History Main Topics  . Smoking status: Former Smoker -- 0.5 packs/day for 30 years    Quit date: 08/31/1990  . Smokeless tobacco: Not on file  . Alcohol Use: No  . Drug Use: No  . Sexually Active: Not on file   Other Topics Concern  . Not on file   Social History Narrative  . No narrative on file    Family History  Problem Relation Age of Onset  . Coronary artery disease    . Hypertension    . Diabetes Mother   . Diabetes Father   . Heart disease Mother   .  Colon cancer Neg Hx   . Stomach cancer Sister     Review of Systems:  As stated in the HPI and otherwise negative.   BP 140/80  Pulse 62  Ht 5' 8.5" (1.74 m)  Wt 240 lb (108.863 kg)  BMI 35.96 kg/m2  Physical Examination: General: Well developed, well nourished, NAD HEENT: OP clear, mucus membranes moist SKIN: warm, dry. No rashes. Neuro: No focal deficits Musculoskeletal: Muscle strength 5/5 all ext Psychiatric: Mood and affect normal Neck: No JVD, Transmitted sounds in both carotid bruits, no thyromegaly, no lymphadenopathy. Lungs:Clear bilaterally, no wheezes, rhonci, crackles Cardiovascular: Regular rate and rhythm. Loud systolic murmur. No gallops or rubs. Abdomen:Soft. Bowel sounds present. Non-tender.  Extremities: Trace bilateral  lower extremity edema. Pulses are 2 + in the bilateral DP/PT.  EKG:NSR, rate 62 bpm. LVH.

## 2011-04-21 ENCOUNTER — Ambulatory Visit (HOSPITAL_COMMUNITY): Payer: Medicare Other | Attending: Cardiovascular Disease | Admitting: Radiology

## 2011-04-21 DIAGNOSIS — E119 Type 2 diabetes mellitus without complications: Secondary | ICD-10-CM | POA: Insufficient documentation

## 2011-04-21 DIAGNOSIS — E785 Hyperlipidemia, unspecified: Secondary | ICD-10-CM | POA: Insufficient documentation

## 2011-04-21 DIAGNOSIS — I10 Essential (primary) hypertension: Secondary | ICD-10-CM | POA: Insufficient documentation

## 2011-04-21 DIAGNOSIS — I079 Rheumatic tricuspid valve disease, unspecified: Secondary | ICD-10-CM | POA: Insufficient documentation

## 2011-04-21 DIAGNOSIS — I359 Nonrheumatic aortic valve disorder, unspecified: Secondary | ICD-10-CM

## 2011-04-28 ENCOUNTER — Encounter: Payer: Self-pay | Admitting: *Deleted

## 2011-06-04 ENCOUNTER — Ambulatory Visit (INDEPENDENT_AMBULATORY_CARE_PROVIDER_SITE_OTHER): Payer: Medicare Other | Admitting: Internal Medicine

## 2011-06-04 ENCOUNTER — Encounter: Payer: Self-pay | Admitting: Internal Medicine

## 2011-06-04 DIAGNOSIS — I441 Atrioventricular block, second degree: Secondary | ICD-10-CM

## 2011-06-04 DIAGNOSIS — I10 Essential (primary) hypertension: Secondary | ICD-10-CM

## 2011-06-04 DIAGNOSIS — I442 Atrioventricular block, complete: Secondary | ICD-10-CM

## 2011-06-04 LAB — PACEMAKER DEVICE OBSERVATION
AL THRESHOLD: 0.875 V
ATRIAL PACING PM: 25
BAMS-0001: 150 {beats}/min
BAMS-0003: 70 {beats}/min
DEVICE MODEL PM: 7131681
RV LEAD IMPEDENCE PM: 612.5 Ohm
RV LEAD THRESHOLD: 0.875 V

## 2011-06-04 NOTE — Patient Instructions (Signed)
Your physician wants you to follow-up in: 12 months with Dr Jacquiline Doe will receive a reminder letter in the mail two months in advance. If you don't receive a letter, please call our office to schedule the follow-up appointment.  Remote monitoring is used to monitor your Pacemaker of ICD from home. This monitoring reduces the number of office visits required to check your device to one time per year. It allows [place] to keep an eye on the functioning of your device to ensure it is working properly. You are scheduled for a device check from home on 09/03/2011 You may send your transmission at any time that day. If you have a wireless device, the transmission will be sent automatically. After your physician reviews your transmission, you will receive a postcard with your next transmission date.  Watch Salt in Diet

## 2011-06-04 NOTE — Assessment & Plan Note (Signed)
Normal pacemaker function See Arita Miss Art report  She has predominantly preserved intrinsic conduction. VIP turned on today to minimize ventricular pacing.  Merlin checks every 3months,   Return in 12 months

## 2011-06-04 NOTE — Progress Notes (Signed)
The patient presents today for routine electrophysiology followup.  Since having her pacemaker implanted, the patient reports doing very well.  Today, she denies symptoms of palpitations, chest pain, shortness of breath,lower extremity edema,presyncope, syncope, or neurologic sequela.  The patient feels that she is tolerating medications without difficulties and is otherwise without complaint today.   Past Medical History  Diagnosis Date  . DM (diabetes mellitus)   . Hyperlipidemia   . HTN (hypertension)   . CAD (coronary artery disease)     s/p NSTEMI 2003 with stent placement OM1.  PTCA with cutting balloon prox LAD. 2005 admission with chest pain, BMS placed to CFX and DES placed in RCA  . Aortic stenosis     moderate to severe  . MR (mental retardation)     mild  . Anemia   . CRI (chronic renal insufficiency)   . CVA (cerebral infarction)     remote; recovered  . Second degree Mobitz II AV block     s/p PPM 12/2009  . Hx of colonoscopy    Past Surgical History  Procedure Date  . Hysterectomy   . Benign vocal cord tumor removed   . Right knee surgery     x 3 with TKA  . Sjm ppm 12/2009    implanted for second degree AV block and syncope    Current Outpatient Prescriptions  Medication Sig Dispense Refill  . aspirin 81 MG tablet Take 81 mg by mouth daily.        . carvedilol (COREG) 6.25 MG tablet Take 1 tablet (6.25 mg total) by mouth 2 (two) times daily.  60 tablet  6  . clopidogrel (PLAVIX) 75 MG tablet Take 75 mg by mouth daily.        . nitroGLYCERIN (NITROSTAT) 0.4 MG SL tablet Place 0.4 mg under the tongue every 5 (five) minutes as needed.        . rosuvastatin (CRESTOR) 10 MG tablet Take 10 mg by mouth daily.          Allergies  Allergen Reactions  . Latex     History   Social History  . Marital Status: Widowed    Spouse Name: N/A    Number of Children: 8  . Years of Education: N/A   Occupational History  . Not on file.   Social History Main Topics  .  Smoking status: Former Smoker -- 0.5 packs/day for 30 years    Quit date: 08/31/1990  . Smokeless tobacco: Not on file  . Alcohol Use: No  . Drug Use: No  . Sexually Active: Not on file   Other Topics Concern  . Not on file   Social History Narrative  . No narrative on file    Family History  Problem Relation Age of Onset  . Coronary artery disease    . Hypertension    . Diabetes Mother   . Diabetes Father   . Heart disease Mother   . Colon cancer Neg Hx   . Stomach cancer Sister      Physical Exam: Filed Vitals:   06/04/11 1217  BP: 146/70  Pulse: 72  Resp: 18  Height: 5\' 8"  (1.727 m)  Weight: 238 lb 1.9 oz (108.011 kg)    GEN- The patient is well appearing, alert and oriented x 3 today.   Head- normocephalic, atraumatic Eyes-  Sclera clear, conjunctiva pink Ears- hearing intact Oropharynx- clear Neck- supple, no JVP Lymph- no cervical lymphadenopathy Lungs- Clear to ausculation bilaterally, normal  work of breathing Chest- pacemaker pocket is well healed Heart- Regular rate and rhythm, 2/6 SEM LUSB, mid peaking GI- soft, NT, ND, + BS Extremities- no clubbing, cyanosis, or edema  Pacemaker interrogation- reviewed in detail today,  See PACEART report  Assessment and Plan:

## 2011-06-04 NOTE — Assessment & Plan Note (Signed)
2 gram sodium restriction advised No medication changes today

## 2011-07-02 DIAGNOSIS — F0280 Dementia in other diseases classified elsewhere without behavioral disturbance: Secondary | ICD-10-CM

## 2011-07-02 HISTORY — DX: Dementia in other diseases classified elsewhere, unspecified severity, without behavioral disturbance, psychotic disturbance, mood disturbance, and anxiety: F02.80

## 2011-08-03 ENCOUNTER — Other Ambulatory Visit: Payer: Self-pay

## 2011-08-03 MED ORDER — ROSUVASTATIN CALCIUM 10 MG PO TABS: 10.0000 mg | ORAL_TABLET | Freq: Every day | ORAL | Status: DC

## 2011-08-28 ENCOUNTER — Other Ambulatory Visit: Payer: Self-pay | Admitting: Cardiology

## 2011-09-03 ENCOUNTER — Ambulatory Visit (INDEPENDENT_AMBULATORY_CARE_PROVIDER_SITE_OTHER): Payer: Medicare Other | Admitting: *Deleted

## 2011-09-03 DIAGNOSIS — I441 Atrioventricular block, second degree: Secondary | ICD-10-CM

## 2011-09-05 ENCOUNTER — Encounter: Payer: Self-pay | Admitting: Internal Medicine

## 2011-09-05 ENCOUNTER — Other Ambulatory Visit: Payer: Self-pay | Admitting: Internal Medicine

## 2011-09-05 LAB — REMOTE PACEMAKER DEVICE
AL IMPEDENCE PM: 400 Ohm
AL THRESHOLD: 0.875 V
RV LEAD IMPEDENCE PM: 600 Ohm

## 2011-09-10 ENCOUNTER — Encounter: Payer: Self-pay | Admitting: *Deleted

## 2011-09-10 NOTE — Progress Notes (Signed)
Remote pacer check  

## 2011-10-21 ENCOUNTER — Ambulatory Visit: Payer: Medicare Other | Admitting: Cardiovascular Disease

## 2011-12-03 ENCOUNTER — Ambulatory Visit (INDEPENDENT_AMBULATORY_CARE_PROVIDER_SITE_OTHER): Payer: Medicare Other | Admitting: *Deleted

## 2011-12-03 DIAGNOSIS — I441 Atrioventricular block, second degree: Secondary | ICD-10-CM

## 2011-12-04 ENCOUNTER — Encounter: Payer: Self-pay | Admitting: Internal Medicine

## 2011-12-04 ENCOUNTER — Other Ambulatory Visit: Payer: Self-pay | Admitting: Internal Medicine

## 2011-12-04 LAB — REMOTE PACEMAKER DEVICE
AL THRESHOLD: 0.875 V
ATRIAL PACING PM: 37
BAMS-0001: 150 {beats}/min
BAMS-0003: 70 {beats}/min
DEVICE MODEL PM: 7131681
RV LEAD THRESHOLD: 0.875 V

## 2011-12-17 ENCOUNTER — Encounter: Payer: Self-pay | Admitting: *Deleted

## 2011-12-17 NOTE — Progress Notes (Signed)
Remote pacer check  

## 2012-02-16 ENCOUNTER — Ambulatory Visit (INDEPENDENT_AMBULATORY_CARE_PROVIDER_SITE_OTHER): Payer: Medicare Other | Admitting: Cardiovascular Disease

## 2012-02-16 ENCOUNTER — Encounter: Payer: Self-pay | Admitting: Cardiovascular Disease

## 2012-02-16 VITALS — BP 140/78 | HR 67 | Ht 68.0 in | Wt 231.0 lb

## 2012-02-16 DIAGNOSIS — I350 Nonrheumatic aortic (valve) stenosis: Secondary | ICD-10-CM

## 2012-02-16 DIAGNOSIS — I359 Nonrheumatic aortic valve disorder, unspecified: Secondary | ICD-10-CM

## 2012-02-16 NOTE — Assessment & Plan Note (Signed)
Stable. Will continue ASA, statin, Plavix and beta blocker. Her left shoulder pain happens the day after her exercise. No exertional chest pain.

## 2012-02-16 NOTE — Patient Instructions (Addendum)
Your physician wants you to follow-up in: 6 months. You will receive a reminder letter in the mail two months in advance. If you don't receive a letter, please call our office to schedule the follow-up appointment.  Your physician has requested that you have an echocardiogram. Echocardiography is a painless test that uses sound waves to create images of your heart. It provides your doctor with information about the size and shape of your heart and how well your heart's chambers and valves are working. This procedure takes approximately one hour. There are no restrictions for this procedure. To be done in 6 months.       

## 2012-02-16 NOTE — Assessment & Plan Note (Signed)
Moderate by echo 8/12. Repeat echo 6 months. She is asymptomatic.

## 2012-02-16 NOTE — Progress Notes (Signed)
History of Present Illness: [name] is a pleasant 78 year old African American female with a past medical history significant for  coronary artery disease, status post placement of stents in the circumflex and right coronary arteries in 2005 by Dr. Juanda Chance, as well as hypertension, moderate AS, hyperlipidemia, obesity, and diabetes mellitus and high grade AV block now s/p PPM, who comes in today for routine cardiac followup. She had a pacemaker placed by Dr. Johney Frame in May 2011 for second degree AV block. Most recent echo in August 2012 with Normal LV size and function, LVEF 50%  with moderate aortic stenosis. Carotid artery dopplers October 2011 with mild bilateral disease.   No chest pain or SOB. Occasional lower ext swelling.  No near syncope or syncope. Her left arm does ache the day after she sweeps or vacuums. No exertional arm pain or chest pain.   Primary Care Physician: Jarome Matin  Last Lipid Profile:  Lipid Panel     Component Value Date/Time   CHOL 190 02/02/2011 1017   TRIG 85.0 02/02/2011 1017   HDL 73.60 02/02/2011 1017   CHOLHDL 3 02/02/2011 1017   VLDL 17.0 02/02/2011 1017   LDLCALC 99 02/02/2011 1017     Past Medical History  Diagnosis Date  . DM (diabetes mellitus)   . Hyperlipidemia   . HTN (hypertension)   . CAD (coronary artery disease)     s/p NSTEMI 2003 with stent placement OM1.  PTCA with cutting balloon prox LAD. 2005 admission with chest pain, BMS placed to CFX and DES placed in RCA  . Aortic stenosis     moderate to severe  . Anemia   . CRI (chronic renal insufficiency)   . CVA (cerebral infarction)     remote; recovered  . Second degree Mobitz II AV block     s/p PPM 12/2009  . Hx of colonoscopy     Past Surgical History  Procedure Date  . Hysterectomy   . Benign vocal cord tumor removed   . Right knee surgery     x 3 with TKA  . Sjm ppm 12/2009    implanted for second degree AV block and syncope    Current Outpatient Prescriptions  Medication  Sig Dispense Refill  . aspirin 81 MG tablet Take 81 mg by mouth daily.        . carvedilol (COREG) 6.25 MG tablet take 1 tablet by mouth twice a day  60 tablet  6  . clopidogrel (PLAVIX) 75 MG tablet Take 75 mg by mouth daily.        . nitroGLYCERIN (NITROSTAT) 0.4 MG SL tablet Place 0.4 mg under the tongue every 5 (five) minutes as needed.        . rosuvastatin (CRESTOR) 10 MG tablet Take 1 tablet (10 mg total) by mouth daily.  30 tablet  6    Allergies  Allergen Reactions  . Latex     History   Social History  . Marital Status: Widowed    Spouse Name: N/A    Number of Children: 8  . Years of Education: N/A   Occupational History  . Not on file.   Social History Main Topics  . Smoking status: Former Smoker -- 0.5 packs/day for 30 years    Quit date: 08/31/1990  . Smokeless tobacco: Not on file  . Alcohol Use: No  . Drug Use: No  . Sexually Active: Not on file   Other Topics Concern  . Not on file  Social History Narrative  . No narrative on file    Family History  Problem Relation Age of Onset  . Coronary artery disease    . Hypertension    . Diabetes Mother   . Diabetes Father   . Heart disease Mother   . Colon cancer Neg Hx   . Stomach cancer Sister     Review of Systems:  As stated in the HPI and otherwise negative.   BP 140/78  Pulse 67  Ht 5\' 8"  (1.727 m)  Wt 231 lb (104.781 kg)  BMI 35.12 kg/m2  Physical Examination: General: Well developed, well nourished, NAD HEENT: OP clear, mucus membranes moist SKIN: warm, dry. No rashes. Neuro: No focal deficits Musculoskeletal: Muscle strength 5/5 all ext Psychiatric: Mood and affect normal Neck: No JVD, no carotid bruits, no thyromegaly, no lymphadenopathy. Lungs:Clear bilaterally, no wheezes, rhonci, crackles Cardiovascular: Regular rate and rhythm. Systolic  Murmurs noted. No gallops or rubs. Abdomen:Soft. Bowel sounds present. Non-tender.  Extremities: No lower extremity edema. Pulses are 2 + in  the bilateral DP/PT.

## 2012-02-24 ENCOUNTER — Other Ambulatory Visit: Payer: Self-pay | Admitting: Cardiovascular Disease

## 2012-02-24 MED ORDER — ROSUVASTATIN CALCIUM 10 MG PO TABS: 10.0000 mg | ORAL_TABLET | Freq: Every day | ORAL | Status: DC

## 2012-03-04 ENCOUNTER — Ambulatory Visit (INDEPENDENT_AMBULATORY_CARE_PROVIDER_SITE_OTHER): Payer: Medicare Other | Admitting: *Deleted

## 2012-03-04 ENCOUNTER — Encounter: Payer: Self-pay | Admitting: Internal Medicine

## 2012-03-04 DIAGNOSIS — I441 Atrioventricular block, second degree: Secondary | ICD-10-CM

## 2012-03-04 LAB — REMOTE PACEMAKER DEVICE
AL AMPLITUDE: 3.3 mv
AL IMPEDENCE PM: 400 Ohm
BATTERY VOLTAGE: 2.95 V
RV LEAD AMPLITUDE: 12 mv

## 2012-03-14 ENCOUNTER — Encounter: Payer: Self-pay | Admitting: *Deleted

## 2012-03-16 ENCOUNTER — Other Ambulatory Visit: Payer: Self-pay | Admitting: Cardiology

## 2012-03-16 NOTE — Telephone Encounter (Signed)
Refilled carvedilol.

## 2012-05-18 ENCOUNTER — Encounter: Payer: Self-pay | Admitting: *Deleted

## 2012-05-18 DIAGNOSIS — Z950 Presence of cardiac pacemaker: Secondary | ICD-10-CM | POA: Insufficient documentation

## 2012-06-02 ENCOUNTER — Encounter: Payer: Self-pay | Admitting: Internal Medicine

## 2012-06-02 ENCOUNTER — Ambulatory Visit (INDEPENDENT_AMBULATORY_CARE_PROVIDER_SITE_OTHER): Payer: Medicare Other | Admitting: Internal Medicine

## 2012-06-02 VITALS — BP 143/82 | HR 63 | Ht 68.0 in | Wt 228.4 lb

## 2012-06-02 DIAGNOSIS — I441 Atrioventricular block, second degree: Secondary | ICD-10-CM

## 2012-06-02 DIAGNOSIS — Z950 Presence of cardiac pacemaker: Secondary | ICD-10-CM

## 2012-06-02 DIAGNOSIS — I10 Essential (primary) hypertension: Secondary | ICD-10-CM

## 2012-06-02 LAB — PACEMAKER DEVICE OBSERVATION
AL IMPEDENCE PM: 450 Ohm
AL THRESHOLD: 0.875 V
BAMS-0001: 150 {beats}/min
BAMS-0003: 70 {beats}/min
DEVICE MODEL PM: 7131681
VENTRICULAR PACING PM: 64

## 2012-06-02 NOTE — Patient Instructions (Addendum)
Remote monitoring is used to monitor your Pacemaker of ICD from home. This monitoring reduces the number of office visits required to check your device to one time per year. It allows us to keep an eye on the functioning of your device to ensure it is working properly. You are scheduled for a device check from home on September 05, 2012. You may send your transmission at any time that day. If you have a wireless device, the transmission will be sent automatically. After your physician reviews your transmission, you will receive a postcard with your next transmission date.  Your physician wants you to follow-up in: 1 year with Dr Allred.  You will receive a reminder letter in the mail two months in advance. If you don't receive a letter, please call our office to schedule the follow-up appointment.  

## 2012-06-02 NOTE — Progress Notes (Signed)
PCP: Garlan Fillers, MD  [name] is a 78 y.o. female who presents today for routine electrophysiology followup.  Since last being seen in our clinic, the patient reports doing very well.   Her primary limitation is with R knee pain which is a chronic issue.  Today, she denies symptoms of palpitations, chest pain, shortness of breath,  lower extremity edema, dizziness, presyncope, or syncope.  The patient is otherwise without complaint today.   Past Medical History  Diagnosis Date  . DM (diabetes mellitus)   . Hyperlipidemia   . HTN (hypertension)   . CAD (coronary artery disease)     s/p NSTEMI 2003 with stent placement OM1.  PTCA with cutting balloon prox LAD. 2005 admission with chest pain, BMS placed to CFX and DES placed in RCA  . Aortic stenosis     moderate to severe  . Anemia   . CRI (chronic renal insufficiency)   . CVA (cerebral infarction)     remote; recovered  . Second degree Mobitz II AV block     s/p PPM 12/2009  . Hx of colonoscopy    Past Surgical History  Procedure Date  . Hysterectomy   . Benign vocal cord tumor removed   . Right knee surgery     x 3 with TKA  . Pacemaker insertion 01/10/2010    SJM [employer] DR RF implanted by Dr Johney Frame    Current Outpatient Prescriptions  Medication Sig Dispense Refill  . aspirin 81 MG tablet Take 81 mg by mouth daily.        . carvedilol (COREG) 6.25 MG tablet take 1 tablet by mouth twice a day  60 tablet  6  . clopidogrel (PLAVIX) 75 MG tablet Take 75 mg by mouth daily.        . nitroGLYCERIN (NITROSTAT) 0.4 MG SL tablet Place 0.4 mg under the tongue every 5 (five) minutes as needed.        . rosuvastatin (CRESTOR) 10 MG tablet Take 1 tablet (10 mg total) by mouth daily.  30 tablet  6    Physical Exam: Filed Vitals:   06/02/12 0957  BP: 143/82  Pulse: 63  Height: 5\' 8"  (1.727 m)  Weight: 228 lb 6.4 oz (103.602 kg)    GEN- The patient is well appearing, alert and oriented x 3 today.  Walks with a cane Head-  normocephalic, atraumatic Eyes-  Sclera clear, conjunctiva pink Ears- hearing intact Oropharynx- clear Lungs- Clear to ausculation bilaterally, normal work of breathing Chest- pacemaker pocket is well healed Heart- Regular rate and rhythm, 2/6 SEM LUSB (mid peaking) GI- soft, NT, ND, + BS Extremities- no clubbing, cyanosis, or edema  Pacemaker interrogation- reviewed in detail today,  See PACEART report  Assessment and Plan:  1. Mobitz II AV block Normal pacemaker function See Pace Art report No changes today   2. HTN Stable  No changes today

## 2012-08-09 ENCOUNTER — Ambulatory Visit: Payer: Medicare Other | Admitting: Cardiovascular Disease

## 2012-08-09 ENCOUNTER — Other Ambulatory Visit (HOSPITAL_COMMUNITY): Payer: Medicare Other

## 2012-09-02 ENCOUNTER — Other Ambulatory Visit: Payer: Self-pay | Admitting: Cardiovascular Disease

## 2012-09-02 MED ORDER — ROSUVASTATIN CALCIUM 10 MG PO TABS: 10.0000 mg | ORAL_TABLET | Freq: Every day | ORAL | Status: DC

## 2012-09-05 ENCOUNTER — Ambulatory Visit (INDEPENDENT_AMBULATORY_CARE_PROVIDER_SITE_OTHER): Payer: Medicare Other | Admitting: *Deleted

## 2012-09-05 DIAGNOSIS — I441 Atrioventricular block, second degree: Secondary | ICD-10-CM

## 2012-09-05 DIAGNOSIS — Z950 Presence of cardiac pacemaker: Secondary | ICD-10-CM

## 2012-09-06 LAB — REMOTE PACEMAKER DEVICE
AL AMPLITUDE: 4.4 mv
AL IMPEDENCE PM: 400 Ohm
BAMS-0003: 70 {beats}/min
BATTERY VOLTAGE: 2.95 V
DEVICE MODEL PM: 7131681
RV LEAD AMPLITUDE: 12 mv
RV LEAD IMPEDENCE PM: 560 Ohm

## 2012-09-13 ENCOUNTER — Encounter: Payer: Self-pay | Admitting: Cardiovascular Disease

## 2012-09-13 ENCOUNTER — Ambulatory Visit (HOSPITAL_COMMUNITY): Payer: Medicare Other | Attending: Cardiology | Admitting: Radiology

## 2012-09-13 ENCOUNTER — Ambulatory Visit (INDEPENDENT_AMBULATORY_CARE_PROVIDER_SITE_OTHER): Payer: Medicare Other | Admitting: Cardiovascular Disease

## 2012-09-13 VITALS — BP 150/88 | HR 69 | Ht 68.0 in | Wt 222.0 lb

## 2012-09-13 DIAGNOSIS — I2510 Atherosclerotic heart disease of native coronary artery without angina pectoris: Secondary | ICD-10-CM

## 2012-09-13 DIAGNOSIS — E119 Type 2 diabetes mellitus without complications: Secondary | ICD-10-CM | POA: Insufficient documentation

## 2012-09-13 DIAGNOSIS — I359 Nonrheumatic aortic valve disorder, unspecified: Secondary | ICD-10-CM

## 2012-09-13 DIAGNOSIS — I10 Essential (primary) hypertension: Secondary | ICD-10-CM | POA: Insufficient documentation

## 2012-09-13 DIAGNOSIS — I350 Nonrheumatic aortic (valve) stenosis: Secondary | ICD-10-CM

## 2012-09-13 DIAGNOSIS — I369 Nonrheumatic tricuspid valve disorder, unspecified: Secondary | ICD-10-CM | POA: Insufficient documentation

## 2012-09-13 DIAGNOSIS — I080 Rheumatic disorders of both mitral and aortic valves: Secondary | ICD-10-CM | POA: Insufficient documentation

## 2012-09-13 NOTE — Progress Notes (Signed)
Echocardiogram performed.  

## 2012-09-13 NOTE — Patient Instructions (Signed)
Your physician wants you to follow-up in:  6 months. You will receive a reminder letter in the mail two months in advance. If you don't receive a letter, please call our office to schedule the follow-up appointment.  Your physician recommends that you return for lab work later this week or next week.  This will be fasting.  --Lipid and Liver profile. The lab opens at 7:30 every week day.  Your physician has requested that you have an echocardiogram. Echocardiography is a painless test that uses sound waves to create images of your heart. It provides your doctor with information about the size and shape of your heart and how well your heart's chambers and valves are working. This procedure takes approximately one hour. There are no restrictions for this procedure. To be done in 6 months prior to appt with Dr. Clifton James

## 2012-09-13 NOTE — Progress Notes (Addendum)
History of Present Illness: 79 year old African American female with a past medical history significant for coronary artery disease, status post placement of stents in the circumflex and right coronary arteries in 2005 by Dr. Juanda Chance, as well as hypertension, moderate AS, hyperlipidemia, obesity, diabetes mellitus and high grade AV block now s/p PPM, who comes in today for routine cardiac followup. She had a pacemaker placed by Dr. Johney Frame in May 2011 for second degree AV block. Most recent echo in August 2012 with Normal LV size and function, LVEF 50% with moderate aortic stenosis. Carotid artery dopplers October 2011 with mild bilateral disease. Echo today with progression of AV disease. Her AS is now moderately severe with AVA of 0.80 and mean gradient of 25-28.   No chest pain or SOB. No LE edema. No near syncope or syncope.   Primary Care Physician: Jarome Matin  Last Lipid Profile:Lipid Panel     Component Value Date/Time   CHOL 190 02/02/2011 1017   TRIG 85.0 02/02/2011 1017   HDL 73.60 02/02/2011 1017   CHOLHDL 3 02/02/2011 1017   VLDL 17.0 02/02/2011 1017   LDLCALC 99 02/02/2011 1017     Past Medical History  Diagnosis Date  . DM (diabetes mellitus)   . Hyperlipidemia   . HTN (hypertension)   . CAD (coronary artery disease)     s/p NSTEMI 2003 with stent placement OM1.  PTCA with cutting balloon prox LAD. 2005 admission with chest pain, BMS placed to CFX and DES placed in RCA  . Aortic stenosis     moderate to severe  . Anemia   . CRI (chronic renal insufficiency)   . CVA (cerebral infarction)     remote; recovered  . Second degree Mobitz II AV block     s/p PPM 12/2009  . Hx of colonoscopy     Past Surgical History  Procedure Date  . Hysterectomy   . Benign vocal cord tumor removed   . Right knee surgery     x 3 with TKA  . Pacemaker insertion 01/10/2010    SJM Accent DR RF implanted by Dr Johney Frame    Current Outpatient Prescriptions  Medication Sig Dispense Refill  .  aspirin 81 MG tablet Take 81 mg by mouth daily.        . carvedilol (COREG) 6.25 MG tablet take 1 tablet by mouth twice a day  60 tablet  6  . clopidogrel (PLAVIX) 75 MG tablet Take 75 mg by mouth daily.        . nitroGLYCERIN (NITROSTAT) 0.4 MG SL tablet Place 0.4 mg under the tongue every 5 (five) minutes as needed.        . rosuvastatin (CRESTOR) 10 MG tablet Take 1 tablet (10 mg total) by mouth daily.  30 tablet  6    Allergies  Allergen Reactions  . Latex     History   Social History  . Marital Status: Widowed    Spouse Name: N/A    Number of Children: 8  . Years of Education: N/A   Occupational History  . Not on file.   Social History Main Topics  . Smoking status: Former Smoker -- 0.5 packs/day for 30 years    Quit date: 08/31/1990  . Smokeless tobacco: Not on file  . Alcohol Use: No  . Drug Use: No  . Sexually Active: Not on file   Other Topics Concern  . Not on file   Social History Narrative  . No narrative on  file    Family History  Problem Relation Age of Onset  . Coronary artery disease    . Hypertension    . Diabetes Mother   . Diabetes Father   . Heart disease Mother   . Colon cancer Neg Hx   . Stomach cancer Sister     Review of Systems:  As stated in the HPI and otherwise negative.   BP 150/88  Pulse 69  Ht 5\' 8"  (1.727 m)  Wt 222 lb (100.699 kg)  BMI 33.76 kg/m2  SpO2 99%  Physical Examination: General: Well developed, well nourished, NAD HEENT: OP clear, mucus membranes moist SKIN: warm, dry. No rashes. Neuro: No focal deficits Musculoskeletal: Muscle strength 5/5 all ext Psychiatric: Mood and affect normal Neck: No JVD, no carotid bruits, no thyromegaly, no lymphadenopathy. Lungs:Clear bilaterally, no wheezes, rhonci, crackles Cardiovascular: Regular rate and rhythm. Systolic murmur. No gallops or rubs. Abdomen:Soft. Bowel sounds present. Non-tender.  Extremities: No lower extremity edema. Pulses are 2 + in the bilateral  DP/PT.  EKG: NSR, rate 63 bpm. LVH.   Echo 09/13/12:  Left ventricle: The cavity size was normal. There was moderate focal basal hypertrophy of the septum. Systolic function was normal. The estimated ejection fraction was in the range of 55% to 60%. Wall motion was normal; there were no regional wall motion abnormalities. Doppler parameters are consistent with abnormal left ventricular relaxation (grade 1 diastolic dysfunction). Doppler parameters are consistent with high ventricular filling pressure. - Aortic valve: Valve mobility was restricted. There was moderate stenosis. Mean gradient: 29mm Hg (S). Peak gradient: 47mm Hg (S). - Mitral valve: Calcified annulus. Mild regurgitation. - Atrial septum: There was an atrial septal aneurysm.     Assessment and Plan:   1. CAD: Stable. Will continue ASA, statin, Plavix and beta blocker.    2. AORTIC STENOSIS: Asymptomatic. Moderate to severe AS by echo today with mean gradient of 25-68mm Hg. AVA 0.80. Will repeat echo in 6 months. I have reviewed the signs of progression of her valve disease.   3. High grade heart block: PPM in place. Followed by Dr. Johney Frame

## 2012-09-15 ENCOUNTER — Encounter: Payer: Self-pay | Admitting: *Deleted

## 2012-09-19 ENCOUNTER — Other Ambulatory Visit (INDEPENDENT_AMBULATORY_CARE_PROVIDER_SITE_OTHER): Payer: Medicare Other

## 2012-09-19 DIAGNOSIS — I2510 Atherosclerotic heart disease of native coronary artery without angina pectoris: Secondary | ICD-10-CM

## 2012-09-19 LAB — HEPATIC FUNCTION PANEL
ALT: 11 U/L (ref 0–35)
AST: 21 U/L (ref 0–37)
Albumin: 3.7 g/dL (ref 3.5–5.2)
Alkaline Phosphatase: 83 U/L (ref 39–117)
Bilirubin, Direct: 0.1 mg/dL (ref 0.0–0.3)
Total Bilirubin: 0.6 mg/dL (ref 0.3–1.2)
Total Protein: 7.4 g/dL (ref 6.0–8.3)

## 2012-09-19 LAB — LIPID PANEL
HDL: 67.4 mg/dL (ref >39.00)
LDL Cholesterol: 107 mg/dL — ABNORMAL HIGH (ref 0–99)
VLDL: 19.4 mg/dL (ref 0.0–40.0)

## 2012-10-05 ENCOUNTER — Encounter: Payer: Self-pay | Admitting: Internal Medicine

## 2012-10-07 ENCOUNTER — Other Ambulatory Visit: Payer: Self-pay | Admitting: Cardiology

## 2012-11-02 ENCOUNTER — Encounter (INDEPENDENT_AMBULATORY_CARE_PROVIDER_SITE_OTHER): Payer: Medicare Other | Admitting: *Deleted

## 2012-11-02 DIAGNOSIS — M79609 Pain in unspecified limb: Secondary | ICD-10-CM

## 2012-12-05 ENCOUNTER — Ambulatory Visit (INDEPENDENT_AMBULATORY_CARE_PROVIDER_SITE_OTHER): Payer: Medicare Other | Admitting: *Deleted

## 2012-12-05 ENCOUNTER — Encounter: Payer: Self-pay | Admitting: Internal Medicine

## 2012-12-05 ENCOUNTER — Other Ambulatory Visit: Payer: Self-pay | Admitting: Internal Medicine

## 2012-12-05 DIAGNOSIS — Z950 Presence of cardiac pacemaker: Secondary | ICD-10-CM

## 2012-12-05 DIAGNOSIS — I441 Atrioventricular block, second degree: Secondary | ICD-10-CM

## 2012-12-06 LAB — REMOTE PACEMAKER DEVICE
AL AMPLITUDE: 3.1 mv
AL IMPEDENCE PM: 380 Ohm
BAMS-0003: 70 {beats}/min
BATTERY VOLTAGE: 2.95 V
DEVICE MODEL PM: 7131681
RV LEAD AMPLITUDE: 12 mv
RV LEAD IMPEDENCE PM: 580 Ohm

## 2012-12-13 ENCOUNTER — Encounter: Payer: Self-pay | Admitting: *Deleted

## 2013-02-02 ENCOUNTER — Encounter (INDEPENDENT_AMBULATORY_CARE_PROVIDER_SITE_OTHER): Payer: Self-pay | Admitting: Ophthalmology

## 2013-02-03 ENCOUNTER — Encounter (INDEPENDENT_AMBULATORY_CARE_PROVIDER_SITE_OTHER): Payer: Medicare Other | Admitting: Ophthalmology

## 2013-02-03 DIAGNOSIS — I10 Essential (primary) hypertension: Secondary | ICD-10-CM

## 2013-02-03 DIAGNOSIS — H348392 Tributary (branch) retinal vein occlusion, unspecified eye, stable: Secondary | ICD-10-CM

## 2013-02-03 DIAGNOSIS — H43819 Vitreous degeneration, unspecified eye: Secondary | ICD-10-CM

## 2013-02-03 DIAGNOSIS — H34219 Partial retinal artery occlusion, unspecified eye: Secondary | ICD-10-CM

## 2013-02-03 DIAGNOSIS — H35039 Hypertensive retinopathy, unspecified eye: Secondary | ICD-10-CM

## 2013-02-09 ENCOUNTER — Encounter (INDEPENDENT_AMBULATORY_CARE_PROVIDER_SITE_OTHER): Payer: Self-pay | Admitting: Ophthalmology

## 2013-03-13 ENCOUNTER — Encounter: Payer: Self-pay | Admitting: Internal Medicine

## 2013-03-13 ENCOUNTER — Ambulatory Visit (INDEPENDENT_AMBULATORY_CARE_PROVIDER_SITE_OTHER): Payer: Medicare Other | Admitting: *Deleted

## 2013-03-13 DIAGNOSIS — I441 Atrioventricular block, second degree: Secondary | ICD-10-CM

## 2013-03-13 DIAGNOSIS — Z950 Presence of cardiac pacemaker: Secondary | ICD-10-CM

## 2013-03-14 LAB — REMOTE PACEMAKER DEVICE
AL AMPLITUDE: 2.4 mv
ATRIAL PACING PM: 36
BAMS-0001: 150 {beats}/min
BAMS-0003: 70 {beats}/min
RV LEAD THRESHOLD: 0.75 V
VENTRICULAR PACING PM: 64

## 2013-03-17 ENCOUNTER — Encounter: Payer: Self-pay | Admitting: *Deleted

## 2013-03-23 ENCOUNTER — Other Ambulatory Visit: Payer: Self-pay

## 2013-03-23 MED ORDER — ROSUVASTATIN CALCIUM 10 MG PO TABS: 10.0000 mg | ORAL_TABLET | Freq: Every day | ORAL | Status: DC

## 2013-05-09 ENCOUNTER — Other Ambulatory Visit: Payer: Self-pay

## 2013-05-09 MED ORDER — ROSUVASTATIN CALCIUM 10 MG PO TABS: 10.0000 mg | ORAL_TABLET | Freq: Every day | ORAL | Status: DC

## 2013-05-23 ENCOUNTER — Other Ambulatory Visit: Payer: Self-pay

## 2013-05-23 MED ORDER — CARVEDILOL 6.25 MG PO TABS: ORAL_TABLET | ORAL | Status: DC

## 2013-06-19 ENCOUNTER — Ambulatory Visit (INDEPENDENT_AMBULATORY_CARE_PROVIDER_SITE_OTHER): Payer: Medicare Other | Admitting: Ophthalmology

## 2013-06-28 ENCOUNTER — Ambulatory Visit (INDEPENDENT_AMBULATORY_CARE_PROVIDER_SITE_OTHER): Payer: Medicare Other | Admitting: Ophthalmology

## 2013-07-01 ENCOUNTER — Encounter (HOSPITAL_COMMUNITY): Payer: Self-pay | Admitting: Emergency Medicine

## 2013-07-01 ENCOUNTER — Emergency Department (HOSPITAL_COMMUNITY): Payer: Medicare Other

## 2013-07-01 ENCOUNTER — Emergency Department (HOSPITAL_COMMUNITY)
Admission: EM | Admit: 2013-07-01 | Discharge: 2013-07-01 | Disposition: A | Discharge status: Home / Self Care | Payer: Medicare Other | Attending: Emergency Medicine | Admitting: Emergency Medicine

## 2013-07-01 DIAGNOSIS — Z8673 Personal history of transient ischemic attack (TIA), and cerebral infarction without residual deficits: Secondary | ICD-10-CM | POA: Insufficient documentation

## 2013-07-01 DIAGNOSIS — Z7982 Long term (current) use of aspirin: Secondary | ICD-10-CM | POA: Insufficient documentation

## 2013-07-01 DIAGNOSIS — Z9889 Other specified postprocedural states: Secondary | ICD-10-CM | POA: Insufficient documentation

## 2013-07-01 DIAGNOSIS — E785 Hyperlipidemia, unspecified: Secondary | ICD-10-CM | POA: Insufficient documentation

## 2013-07-01 DIAGNOSIS — N189 Chronic kidney disease, unspecified: Secondary | ICD-10-CM | POA: Insufficient documentation

## 2013-07-01 DIAGNOSIS — I129 Hypertensive chronic kidney disease with stage 1 through stage 4 chronic kidney disease, or unspecified chronic kidney disease: Secondary | ICD-10-CM | POA: Insufficient documentation

## 2013-07-01 DIAGNOSIS — Z87891 Personal history of nicotine dependence: Secondary | ICD-10-CM | POA: Insufficient documentation

## 2013-07-01 DIAGNOSIS — Z79899 Other long term (current) drug therapy: Secondary | ICD-10-CM | POA: Insufficient documentation

## 2013-07-01 DIAGNOSIS — R011 Cardiac murmur, unspecified: Secondary | ICD-10-CM | POA: Insufficient documentation

## 2013-07-01 DIAGNOSIS — Z91040 Latex allergy status: Secondary | ICD-10-CM | POA: Insufficient documentation

## 2013-07-01 DIAGNOSIS — E119 Type 2 diabetes mellitus without complications: Secondary | ICD-10-CM | POA: Insufficient documentation

## 2013-07-01 DIAGNOSIS — Z862 Personal history of diseases of the blood and blood-forming organs and certain disorders involving the immune mechanism: Secondary | ICD-10-CM | POA: Insufficient documentation

## 2013-07-01 DIAGNOSIS — R059 Cough, unspecified: Secondary | ICD-10-CM | POA: Insufficient documentation

## 2013-07-01 DIAGNOSIS — Z950 Presence of cardiac pacemaker: Secondary | ICD-10-CM | POA: Insufficient documentation

## 2013-07-01 DIAGNOSIS — I2510 Atherosclerotic heart disease of native coronary artery without angina pectoris: Secondary | ICD-10-CM | POA: Insufficient documentation

## 2013-07-01 DIAGNOSIS — R05 Cough: Secondary | ICD-10-CM | POA: Insufficient documentation

## 2013-07-01 DIAGNOSIS — R079 Chest pain, unspecified: Secondary | ICD-10-CM

## 2013-07-01 LAB — BASIC METABOLIC PANEL
BUN: 11 mg/dL (ref 6–23)
CO2: 27 mEq/L (ref 19–32)
Calcium: 10.2 mg/dL (ref 8.4–10.5)
Chloride: 103 mEq/L (ref 96–112)
Creatinine, Ser: 1.07 mg/dL (ref 0.50–1.10)
GFR calc Af Amer: 55 mL/min — ABNORMAL LOW (ref >90)
GFR calc non Af Amer: 47 mL/min — ABNORMAL LOW (ref >90)
Glucose, Bld: 96 mg/dL (ref 70–99)
Potassium: 3.3 mEq/L — ABNORMAL LOW (ref 3.5–5.1)
Sodium: 139 mEq/L (ref 135–145)

## 2013-07-01 LAB — POCT I-STAT TROPONIN I: Troponin i, poc: 0.02 ng/mL (ref 0.00–0.08)

## 2013-07-01 LAB — CBC
HCT: 37.4 % (ref 36.0–46.0)
Hemoglobin: 12 g/dL (ref 12.0–15.0)
MCH: 26.3 pg (ref 26.0–34.0)
MCHC: 32.1 g/dL (ref 30.0–36.0)
MCV: 82 fL (ref 78.0–100.0)
Platelets: 142 10*3/uL — ABNORMAL LOW (ref 150–400)
RBC: 4.56 MIL/uL (ref 3.87–5.11)
RDW: 14.4 % (ref 11.5–15.5)
WBC: 3.5 10*3/uL — ABNORMAL LOW (ref 4.0–10.5)

## 2013-07-01 LAB — PRO B NATRIURETIC PEPTIDE: Pro B Natriuretic peptide (BNP): 1541 pg/mL — ABNORMAL HIGH (ref 0–450)

## 2013-07-01 MED ORDER — ASPIRIN 81 MG PO CHEW
324.0000 mg | CHEWABLE_TABLET | Freq: Once | ORAL | Status: AC
  Administered 2013-07-01: 324 mg via ORAL
  Filled 2013-07-01: qty 4

## 2013-07-01 NOTE — ED Notes (Signed)
Pt states that she has been having CP on the lt side x 1 hr.  States that she has a pacemaker.

## 2013-07-01 NOTE — ED Provider Notes (Signed)
CSN: [id number]     Arrival date & time 07/01/13  1644 History   First MD Initiated Contact with Patient 07/01/13 1851     Chief Complaint  Patient presents with  . Chest Pain    HPI  [name] is a 79 y.o. female with a PMH of DM, HLD, HTN, CAD s/p stent, s/p pacemaker, aortic stenosis, anemia, CRI, CVA, and mobitz type II who presents to the ED for evaluation of chest pain.  History was provided by the patient and her children.  Patient states that around 4:00 pm she developed sudden left sided chest pain.  Her pain is located on the left side of her chest without radiation.  Her pain was constant but has almost resolved.  Nothing makes her pain better or worse.  She states she has had similar chest pain in the past but "not this bad."  No associated symptoms including nausea, diaphoresis, nausea, or SOB.  She did not take anything for pain.  Her cardiologist is Kathleene Hazel at [facility].  She states she has had a mild non-productive cough.  She has had intermittent abdominal pain off and on but denies this currently.  She is not on anticoagulation.  Previous tobacco user.  No trauma or injuries.     Past Medical History  Diagnosis Date  . DM (diabetes mellitus)   . Hyperlipidemia   . HTN (hypertension)   . CAD (coronary artery disease)     s/p NSTEMI 2003 with stent placement OM1.  PTCA with cutting balloon prox LAD. 2005 admission with chest pain, BMS placed to CFX and DES placed in RCA  . Aortic stenosis     moderate to severe  . Anemia   . CRI (chronic renal insufficiency)   . CVA (cerebral infarction)     remote; recovered  . Second degree Mobitz II AV block     s/p PPM 12/2009  . Hx of colonoscopy    Past Surgical History  Procedure Laterality Date  . Hysterectomy    . Benign vocal cord tumor removed    . Right knee surgery      x 3 with TKA  . Pacemaker insertion  01/10/2010    SJM Accent DR RF implanted by Dr Johney Frame   Family History  Problem Relation Age of  Onset  . Coronary artery disease    . Hypertension    . Diabetes Mother   . Diabetes Father   . Heart disease Mother   . Colon cancer Neg Hx   . Stomach cancer Sister    History  Substance Use Topics  . Smoking status: Former Smoker -- 0.50 packs/day for 30 years    Quit date: 08/31/1990  . Smokeless tobacco: Not on file  . Alcohol Use: No   OB History   Grav Para Term Preterm Abortions TAB SAB Ect Mult Living                 Review of Systems  Constitutional: Negative for fever, chills, diaphoresis, activity change and appetite change.  HENT: Negative for congestion and rhinorrhea.   Respiratory: Positive for cough. Negative for shortness of breath and wheezing.   Cardiovascular: Positive for chest pain. Negative for leg swelling.  Gastrointestinal: Negative for nausea, vomiting, abdominal pain, diarrhea and constipation.  Genitourinary: Negative for dysuria.  Musculoskeletal: Negative for back pain.  Skin: Negative for color change and wound.  Neurological: Negative for dizziness, weakness and light-headedness.    Allergies  Latex  Home Medications   Current Outpatient Rx  Name  Route  Sig  Dispense  Refill  . aspirin 81 MG tablet   Oral   Take 81 mg by mouth daily.           . carvedilol (COREG) 6.25 MG tablet      take 1 tablet by mouth twice a day   60 tablet   3     .Marland KitchenPatient needs to contact office to schedule  App ...   . donepezil (ARICEPT ODT) 10 MG disintegrating tablet   Oral   Take 10 mg by mouth at bedtime.         . nitroGLYCERIN (NITROSTAT) 0.4 MG SL tablet   Sublingual   Place 0.4 mg under the tongue every 5 (five) minutes as needed.           . rosuvastatin (CRESTOR) 10 MG tablet   Oral   Take 1 tablet (10 mg total) by mouth daily.   30 tablet   1     Please call to schedule a follow up appointment. 3 ...    BP 159/76  Pulse 60  Temp(Src) 97.8 F (36.6 C) (Oral)  Resp 14  SpO2 100%  Filed Vitals:   07/01/13 1706  07/01/13 2106  BP: 159/76 157/75  Pulse: 60 60  Temp: 97.8 F (36.6 C) 98.2 F (36.8 C)  TempSrc: Oral Oral  Resp: 14 20  SpO2: 100% 97%    Physical Exam  Constitutional: She is oriented to person, place, and time. She appears well-developed and well-nourished. No distress.  HENT:  Head: Normocephalic and atraumatic.  Right Ear: External ear normal.  Left Ear: External ear normal.  Mouth/Throat: Oropharynx is clear and moist.  Eyes: Conjunctivae are normal. Right eye exhibits no discharge. Left eye exhibits no discharge.  Neck: Normal range of motion. Neck supple.  Cardiovascular: Normal rate, regular rhythm and intact distal pulses.  Exam reveals no gallop and no friction rub.   Murmur heard. Blowing systolic murmur. Dorsalis pedis pulses present bilaterally   Pulmonary/Chest: Effort normal and breath sounds normal. No respiratory distress. She has no wheezes. She has no rales. She exhibits tenderness.  Focal tenderness to palpation under the left breast laterally, which is worse with left arm movement.  Abdominal: Soft. Bowel sounds are normal. She exhibits no distension. There is no tenderness.  Musculoskeletal: Normal range of motion. She exhibits no edema and no tenderness.  Neurological: She is alert and oriented to person, place, and time.  Skin: Skin is warm and dry. She is not diaphoretic.    ED Course  Procedures (including critical care time) Labs Review Labs Reviewed  CBC - Abnormal; Notable for the following:    WBC 3.5 (*)    Platelets 142 (*)    All other components within normal limits  BASIC METABOLIC PANEL - Abnormal; Notable for the following:    Potassium 3.3 (*)    GFR calc non Af Amer 47 (*)    GFR calc Af Amer 55 (*)    All other components within normal limits  PRO B NATRIURETIC PEPTIDE - Abnormal; Notable for the following:    Pro B Natriuretic peptide (BNP) 1541.0 (*)    All other components within normal limits  POCT I-STAT TROPONIN I    Imaging Review No results found.  EKG Interpretation     Ventricular Rate:  60 PR Interval:  227 QRS Duration: 155 QT Interval:  508 QTC Calculation:  508 R Axis:   -70 Text Interpretation:  Atrial-paced rhythm LVH with IVCD, LAD and secondary repol abnrm Inferior infarct, acute (RCA) Prolonged QT interval Probable RV involvement, suggest recording right precordial leads ED PHYSICIAN INTERPRETATION AVAILABLE IN [facility]           Results for orders placed during the hospital encounter of 07/01/13  CBC      Result Value Range   WBC 3.5 (*) 4.0 - 10.5 K/uL   RBC 4.56  3.87 - 5.11 MIL/uL   Hemoglobin 12.0  12.0 - 15.0 g/dL   HCT 91.4  78.2 - 95.6 %   MCV 82.0  78.0 - 100.0 fL   MCH 26.3  26.0 - 34.0 pg   MCHC 32.1  30.0 - 36.0 g/dL   RDW 21.3  08.6 - 57.8 %   Platelets 142 (*) 150 - 400 K/uL  BASIC METABOLIC PANEL      Result Value Range   Sodium 139  135 - 145 mEq/L   Potassium 3.3 (*) 3.5 - 5.1 mEq/L   Chloride 103  96 - 112 mEq/L   CO2 27  19 - 32 mEq/L   Glucose, Bld 96  70 - 99 mg/dL   BUN 11  6 - 23 mg/dL   Creatinine, Ser 4.69  0.50 - 1.10 mg/dL   Calcium 62.9  8.4 - 52.8 mg/dL   GFR calc non Af Amer 47 (*) >90 mL/min   GFR calc Af Amer 55 (*) >90 mL/min  PRO B NATRIURETIC PEPTIDE      Result Value Range   Pro B Natriuretic peptide (BNP) 1541.0 (*) 0 - 450 pg/mL  POCT I-STAT TROPONIN I      Result Value Range   Troponin i, poc 0.02  0.00 - 0.08 ng/mL   Comment 3            DG Chest 2 View (Final result)  Result time: 07/01/13 19:46:38    Final result by Rad Results In Interface (07/01/13 19:46:38)    Narrative:   CLINICAL DATA: Left-sided chest pain. Shortness of breath.  EXAM: CHEST 2 VIEW  COMPARISON: 08/18/2010  FINDINGS: The heart size and mediastinal contours are within normal limits. Both lungs are clear. Dual lead transvenous pacemaker remains in appropriate position. No mass or lymphadenopathy identified  IMPRESSION: No  active cardiopulmonary disease.   Electronically Signed By: Myles Rosenthal M.D. On: 07/01/2013 19:46    MDM   1. Chest pain      [name] is a 79 y.o. female with a PMH of DM, HLD, HTN, CAD s/p stent, s/p pacemaker, aortic stenosis, anemia, CRI, CVA, and mobitz type II who presents to the ED for evaluation of chest pain.  Aspirin given.  Chest x-ray and EKG ordered. Troponin, CBC, BMP, and BNP ordered.    Rechecks  9:00 PM = Patient states she feels well and wants to go home.  Discussed discharge plan with family.     Etiology of chest pain possibly musculoskeletal in nature.  Chest pain reproducible. Chest x-ray negative for an acute cardiopulmonary process, EKG negative for any acute ischemic changes, and troponin negative.  BNP elevated however recent baseline unclear.  No SOB, cough or evidence of CHF exacerbation at this time.  Platelets low, which has been present in the past.  Patient remained in no acute distress throughout her ED visit.  Patient was instructed to follow-up with cardiology.  Strict return precautions given.  Patient and family in  agreement with discharge and plan.     Final impressions: 1. Chest pain     Luiz Iron PA-C   This patient was discussed with Dr. Wenda Low, PA-C 07/03/13 1253

## 2013-07-06 ENCOUNTER — Other Ambulatory Visit: Payer: Self-pay

## 2013-07-06 MED ORDER — ROSUVASTATIN CALCIUM 10 MG PO TABS: 10.0000 mg | ORAL_TABLET | Freq: Every day | ORAL | Status: DC

## 2013-07-06 NOTE — ED Provider Notes (Signed)
Medical screening examination/treatment/procedure(s) were conducted as a shared visit with non-physician practitioner(s) and myself.  I personally evaluated the patient during the encounter.  EKG Interpretation     Ventricular Rate:  60 PR Interval:  227 QRS Duration: 155 QT Interval:  508 QTC Calculation: 508 R Axis:   -70 Text Interpretation:  Atrial-paced rhythm LVH with IVCD, LAD and secondary repol abnrm Inferior infarct, acute (RCA) Prolonged QT interval Probable RV involvement, suggest recording right precordial leads ED PHYSICIAN INTERPRETATION AVAILABLE IN [facility]           83yF with CP. Significant cardiac history, but I do not think currents symptoms cardiac in etiology. Very reproducible with palpation and with ROM of L shoulder. W/u fairly unremarkable. Doubt infectious, PE or dissection. Outpt FU. Return precautions discussed.   Raeford Razor, MD 07/06/13 [phone]

## 2013-08-18 ENCOUNTER — Other Ambulatory Visit: Payer: Self-pay | Admitting: *Deleted

## 2013-08-18 MED ORDER — ROSUVASTATIN CALCIUM 10 MG PO TABS: 10.0000 mg | ORAL_TABLET | Freq: Every day | ORAL | Status: DC

## 2013-09-21 ENCOUNTER — Other Ambulatory Visit: Payer: Self-pay

## 2013-10-13 ENCOUNTER — Encounter: Payer: Medicare Other | Admitting: Cardiovascular Disease

## 2013-10-13 NOTE — Progress Notes (Signed)
Pt cancelled appt. cdm

## 2013-10-16 ENCOUNTER — Emergency Department (HOSPITAL_COMMUNITY)
Admission: EM | Admit: 2013-10-16 | Discharge: 2013-10-16 | Disposition: A | Discharge status: Home / Self Care | Payer: Medicare Other | Attending: Emergency Medicine | Admitting: Emergency Medicine

## 2013-10-16 ENCOUNTER — Encounter (HOSPITAL_COMMUNITY): Payer: Self-pay | Admitting: Emergency Medicine

## 2013-10-16 ENCOUNTER — Emergency Department (HOSPITAL_COMMUNITY): Payer: Medicare Other

## 2013-10-16 DIAGNOSIS — I129 Hypertensive chronic kidney disease with stage 1 through stage 4 chronic kidney disease, or unspecified chronic kidney disease: Secondary | ICD-10-CM | POA: Insufficient documentation

## 2013-10-16 DIAGNOSIS — I2510 Atherosclerotic heart disease of native coronary artery without angina pectoris: Secondary | ICD-10-CM | POA: Insufficient documentation

## 2013-10-16 DIAGNOSIS — Z862 Personal history of diseases of the blood and blood-forming organs and certain disorders involving the immune mechanism: Secondary | ICD-10-CM | POA: Insufficient documentation

## 2013-10-16 DIAGNOSIS — W1809XA Striking against other object with subsequent fall, initial encounter: Secondary | ICD-10-CM | POA: Insufficient documentation

## 2013-10-16 DIAGNOSIS — Z91040 Latex allergy status: Secondary | ICD-10-CM | POA: Insufficient documentation

## 2013-10-16 DIAGNOSIS — S0990XA Unspecified injury of head, initial encounter: Secondary | ICD-10-CM | POA: Insufficient documentation

## 2013-10-16 DIAGNOSIS — E785 Hyperlipidemia, unspecified: Secondary | ICD-10-CM | POA: Insufficient documentation

## 2013-10-16 DIAGNOSIS — IMO0002 Reserved for concepts with insufficient information to code with codable children: Secondary | ICD-10-CM | POA: Insufficient documentation

## 2013-10-16 DIAGNOSIS — W19XXXA Unspecified fall, initial encounter: Secondary | ICD-10-CM

## 2013-10-16 DIAGNOSIS — Z950 Presence of cardiac pacemaker: Secondary | ICD-10-CM | POA: Insufficient documentation

## 2013-10-16 DIAGNOSIS — E119 Type 2 diabetes mellitus without complications: Secondary | ICD-10-CM | POA: Insufficient documentation

## 2013-10-16 DIAGNOSIS — Y939 Activity, unspecified: Secondary | ICD-10-CM | POA: Insufficient documentation

## 2013-10-16 DIAGNOSIS — G8929 Other chronic pain: Secondary | ICD-10-CM | POA: Insufficient documentation

## 2013-10-16 DIAGNOSIS — M79609 Pain in unspecified limb: Secondary | ICD-10-CM | POA: Insufficient documentation

## 2013-10-16 DIAGNOSIS — N189 Chronic kidney disease, unspecified: Secondary | ICD-10-CM | POA: Insufficient documentation

## 2013-10-16 DIAGNOSIS — Y929 Unspecified place or not applicable: Secondary | ICD-10-CM | POA: Insufficient documentation

## 2013-10-16 DIAGNOSIS — Z87891 Personal history of nicotine dependence: Secondary | ICD-10-CM | POA: Insufficient documentation

## 2013-10-16 DIAGNOSIS — M25519 Pain in unspecified shoulder: Secondary | ICD-10-CM | POA: Insufficient documentation

## 2013-10-16 DIAGNOSIS — Z8673 Personal history of transient ischemic attack (TIA), and cerebral infarction without residual deficits: Secondary | ICD-10-CM | POA: Insufficient documentation

## 2013-10-16 DIAGNOSIS — Z79899 Other long term (current) drug therapy: Secondary | ICD-10-CM | POA: Insufficient documentation

## 2013-10-16 LAB — URINE MICROSCOPIC-ADD ON

## 2013-10-16 LAB — CBC WITH DIFFERENTIAL/PLATELET
Basophils Absolute: 0 10*3/uL (ref 0.0–0.1)
Basophils Relative: 0 % (ref 0–1)
Eosinophils Absolute: 0 10*3/uL (ref 0.0–0.7)
Eosinophils Relative: 0 % (ref 0–5)
HCT: 36.6 % (ref 36.0–46.0)
Hemoglobin: 11.9 g/dL — ABNORMAL LOW (ref 12.0–15.0)
Lymphocytes Relative: 26 % (ref 12–46)
Lymphs Abs: 1.2 10*3/uL (ref 0.7–4.0)
MCH: 26.7 pg (ref 26.0–34.0)
MCHC: 32.5 g/dL (ref 30.0–36.0)
MCV: 82.2 fL (ref 78.0–100.0)
Monocytes Absolute: 0.2 10*3/uL (ref 0.1–1.0)
Monocytes Relative: 4 % (ref 3–12)
Neutro Abs: 3.2 10*3/uL (ref 1.7–7.7)
Neutrophils Relative %: 70 % (ref 43–77)
Platelets: 125 10*3/uL — ABNORMAL LOW (ref 150–400)
RBC: 4.45 MIL/uL (ref 3.87–5.11)
RDW: 14.4 % (ref 11.5–15.5)
WBC: 4.6 10*3/uL (ref 4.0–10.5)

## 2013-10-16 LAB — URINALYSIS, ROUTINE W REFLEX MICROSCOPIC
Glucose, UA: NEGATIVE mg/dL
Ketones, ur: 15 mg/dL — AB
Leukocytes, UA: NEGATIVE
Nitrite: NEGATIVE
Protein, ur: NEGATIVE mg/dL
Specific Gravity, Urine: 1.03 (ref 1.005–1.030)
Urobilinogen, UA: 1 mg/dL (ref 0.0–1.0)
pH: 6 (ref 5.0–8.0)

## 2013-10-16 LAB — GLUCOSE, CAPILLARY: Glucose-Capillary: 117 mg/dL — ABNORMAL HIGH (ref 70–99)

## 2013-10-16 LAB — BASIC METABOLIC PANEL
BUN: 12 mg/dL (ref 6–23)
CO2: 29 mEq/L (ref 19–32)
Calcium: 9.8 mg/dL (ref 8.4–10.5)
Chloride: 100 mEq/L (ref 96–112)
Creatinine, Ser: 1.13 mg/dL — ABNORMAL HIGH (ref 0.50–1.10)
GFR calc Af Amer: 51 mL/min — ABNORMAL LOW (ref >90)
GFR calc non Af Amer: 44 mL/min — ABNORMAL LOW (ref >90)
Glucose, Bld: 126 mg/dL — ABNORMAL HIGH (ref 70–99)
Potassium: 3.8 mEq/L (ref 3.7–5.3)
Sodium: 141 mEq/L (ref 137–147)

## 2013-10-16 MED ORDER — TRAMADOL HCL 50 MG PO TABS
50.0000 mg | ORAL_TABLET | Freq: Once | ORAL | Status: AC
  Administered 2013-10-16: 50 mg via ORAL
  Filled 2013-10-16: qty 1

## 2013-10-16 MED ORDER — SODIUM CHLORIDE 0.9 % IV BOLUS (SEPSIS): 500.0000 mL | INTRAVENOUS | Status: DC

## 2013-10-16 NOTE — ED Notes (Signed)
IV returned page with call and verbalizes will attempt IV insertion. ON WAY.

## 2013-10-16 NOTE — ED Notes (Signed)
CBG 117 

## 2013-10-16 NOTE — ED Notes (Signed)
[name] reports 2 unsuccessful IV attempts.

## 2013-10-16 NOTE — ED Notes (Signed)
Pt ambulated with stand by assist approx 20 ft. Pt states "I feel good." Pt uses cane at home.

## 2013-10-16 NOTE — ED Notes (Addendum)
[name] from IV team reports used US on both arms and unable to find IV site. Pt intake 480 ml of water. PA Lawyer aware.

## 2013-10-16 NOTE — ED Notes (Addendum)
Per son pt was walking from refrigerator to oven and fell resulting in swelling above left eye and left shoulder pain. Pt son reports increased weakness today. Pt reports pain 7/10. Pt a/o to self and place but does not recall date/time. Per son this is normal for mother/pt.

## 2013-10-16 NOTE — ED Notes (Signed)
IV team at bedside 

## 2013-10-16 NOTE — ED Notes (Addendum)
Attempt to insertion IV unsuccessful by this RN. [name] at bedside attempting IV insertion.

## 2013-10-16 NOTE — ED Notes (Signed)
IV team paged. Oral fluids given per PA Lawyer request.

## 2013-10-16 NOTE — ED Notes (Signed)
Per pt/family, states she fell on left side, hit head and was disoriented for a moment-no LOC-states she has not been feeling well, weak-c/o left arm, shoulder pain

## 2013-10-16 NOTE — ED Notes (Signed)
Pt pushed as if she had to urinate with in and out catheter and ONLY excreted 1 ml of urine. Christie from lab contacted and verified amount is enough for urinalysis.

## 2013-10-16 NOTE — Discharge Instructions (Signed)
Return here as needed.  Followup with your primary care Dr. for recheck °

## 2013-10-16 NOTE — ED Notes (Signed)
[name], son of pt contact number [phone].

## 2013-10-16 NOTE — ED Provider Notes (Signed)
CSN: [id number]     Arrival date & time 10/16/13  0806 History   First MD Initiated Contact with Patient 10/16/13 0830     Chief Complaint  Patient presents with  . Fall     (Consider location/radiation/quality/duration/timing/severity/associated sxs/prior Treatment) HPI Patient presents emergency department following a fall that occurred just prior to arrival.  Patient, states, that she fell, hitting her head on the left side.  There was no LOC, but was a little bit disoriented.  Following a fall.  Patient, states his chronic left shoulder and arm pain.  Patient denies chest pain, shortness breath, weakness, dizziness, syncope, nausea, vomiting, abdominal pain, back pain, neck pain, numbness, or fever.  The patient, states, that she feels, like she had stumbled and that is what caused her fall.  Past Medical History  Diagnosis Date  . DM (diabetes mellitus)   . Hyperlipidemia   . HTN (hypertension)   . CAD (coronary artery disease)     s/p NSTEMI 2003 with stent placement OM1.  PTCA with cutting balloon prox LAD. 2005 admission with chest pain, BMS placed to CFX and DES placed in RCA  . Aortic stenosis     moderate to severe  . Anemia   . CRI (chronic renal insufficiency)   . CVA (cerebral infarction)     remote; recovered  . Second degree Mobitz II AV block     s/p PPM 12/2009  . Hx of colonoscopy    Past Surgical History  Procedure Laterality Date  . Hysterectomy    . Benign vocal cord tumor removed    . Right knee surgery      x 3 with TKA  . Pacemaker insertion  01/10/2010    SJM Accent DR RF implanted by Dr Johney Frame   Family History  Problem Relation Age of Onset  . Coronary artery disease    . Hypertension    . Diabetes Mother   . Diabetes Father   . Heart disease Mother   . Colon cancer Neg Hx   . Stomach cancer Sister    History  Substance Use Topics  . Smoking status: Former Smoker -- 0.50 packs/day for 30 years    Quit date: 08/31/1990  . Smokeless tobacco:  Not on file  . Alcohol Use: No   OB History   Grav Para Term Preterm Abortions TAB SAB Ect Mult Living                 Review of Systems  All other systems negative except as documented in the HPI. All pertinent positives and negatives as reviewed in the HPI.  Allergies  Latex  Home Medications   Current Outpatient Rx  Name  Route  Sig  Dispense  Refill  . carvedilol (COREG) 12.5 MG tablet   Oral   Take 12.5 mg by mouth 2 (two) times daily with a meal.         . donepezil (ARICEPT ODT) 10 MG disintegrating tablet   Oral   Take 10 mg by mouth at bedtime.         . nitroGLYCERIN (NITROSTAT) 0.4 MG SL tablet   Sublingual   Place 0.4 mg under the tongue every 5 (five) minutes as needed.           . rosuvastatin (CRESTOR) 10 MG tablet   Oral   Take 1 tablet (10 mg total) by mouth daily.   30 tablet   0     NO FURTHER REFILLS WILL  BE GRANTED WITHOUT APPOINT .Marland Kitchen.   . traMADol (ULTRAM) 50 MG tablet   Oral   Take 50 mg by mouth every 6 (six) hours as needed for moderate pain.          BP 167/84  Pulse 81  Temp(Src) 98.4 F (36.9 C) (Oral)  Resp 17  SpO2 100% Physical Exam  Nursing note and vitals reviewed. Constitutional: She is oriented to person, place, and time. She appears well-developed and well-nourished. No distress.  HENT:  Head: Normocephalic and atraumatic.  Mouth/Throat: Oropharynx is clear and moist.  Eyes: Pupils are equal, round, and reactive to light.  Neck: Normal range of motion. Neck supple.  Cardiovascular: Normal rate, regular rhythm, normal heart sounds and intact distal pulses.   Pulmonary/Chest: Effort normal and breath sounds normal. No respiratory distress.  Abdominal: Soft. Bowel sounds are normal. She exhibits no distension. There is no tenderness.  Neurological: She is alert and oriented to person, place, and time. She exhibits normal muscle tone. Coordination normal.  Skin: Skin is warm and dry. No rash noted. No erythema.     ED Course  Procedures (including critical care time) Labs Review Labs Reviewed  GLUCOSE, CAPILLARY - Abnormal; Notable for the following:    Glucose-Capillary 117 (*)    All other components within normal limits  CBC WITH DIFFERENTIAL - Abnormal; Notable for the following:    Hemoglobin 11.9 (*)    Platelets 125 (*)    All other components within normal limits  BASIC METABOLIC PANEL - Abnormal; Notable for the following:    Glucose, Bld 126 (*)    Creatinine, Ser 1.13 (*)    GFR calc non Af Amer 44 (*)    GFR calc Af Amer 51 (*)    All other components within normal limits  URINALYSIS, ROUTINE W REFLEX MICROSCOPIC - Abnormal; Notable for the following:    Hgb urine dipstick TRACE (*)    Bilirubin Urine SMALL (*)    Ketones, ur 15 (*)    All other components within normal limits  URINE MICROSCOPIC-ADD ON - Abnormal; Notable for the following:    Casts HYALINE CASTS (*)    All other components within normal limits  URINE CULTURE   Imaging Review Ct Head Wo Contrast  10/16/2013   CLINICAL DATA:  Fall  EXAM: CT HEAD WITHOUT CONTRAST  CT CERVICAL SPINE WITHOUT CONTRAST  TECHNIQUE: Multidetector CT imaging of the head and cervical spine was performed following the standard protocol without intravenous contrast. Multiplanar CT image reconstructions of the cervical spine were also generated.  COMPARISON:  07/08/2009  FINDINGS: CT HEAD FINDINGS  Atrophy with low attenuation in the white matter. No hemorrhage, extra-axial fluid, or vascular territory infarct. Tiny chronic left thalamic lacunar infarct. No skull fracture. Falx calcification and probable 1 cm right posterior parietal calcified meningioma, both stable.  CT CERVICAL SPINE FINDINGS  Posterior arch of C1 congenitally unfused, a normal variant. No fracture or prevertebral soft tissue swelling. Degenerative disc disease, significant, throughout the entire cervical spine. C4-5 and C5-6 show moderate disc osteophytic bulge is. There  is some degree of disc bulging at all levels.  IMPRESSION: 1. Chronic age-related involutional change in the brain, stable. 2. No acute findings in the cervical spine.   Electronically Signed   By: Esperanza Heir M.D.   On: 10/16/2013 09:56   Ct Cervical Spine Wo Contrast  10/16/2013   CLINICAL DATA:  Fall  EXAM: CT HEAD WITHOUT CONTRAST  CT CERVICAL SPINE WITHOUT  CONTRAST  TECHNIQUE: Multidetector CT imaging of the head and cervical spine was performed following the standard protocol without intravenous contrast. Multiplanar CT image reconstructions of the cervical spine were also generated.  COMPARISON:  07/08/2009  FINDINGS: CT HEAD FINDINGS  Atrophy with low attenuation in the white matter. No hemorrhage, extra-axial fluid, or vascular territory infarct. Tiny chronic left thalamic lacunar infarct. No skull fracture. Falx calcification and probable 1 cm right posterior parietal calcified meningioma, both stable.  CT CERVICAL SPINE FINDINGS  Posterior arch of C1 congenitally unfused, a normal variant. No fracture or prevertebral soft tissue swelling. Degenerative disc disease, significant, throughout the entire cervical spine. C4-5 and C5-6 show moderate disc osteophytic bulge is. There is some degree of disc bulging at all levels.  IMPRESSION: 1. Chronic age-related involutional change in the brain, stable. 2. No acute findings in the cervical spine.   Electronically Signed   By: Esperanza Heiraymond  Rubner M.D.   On: 10/16/2013 09:56   Dg Shoulder Left  10/16/2013   CLINICAL DATA:  Left shoulder pain.  EXAM: LEFT SHOULDER - 2+ VIEW  COMPARISON:  Radiographs dated 07/31/2009  FINDINGS: There is no fracture or dislocation. There has been progression of osteoarthritis of the acromioclavicular joint and glenohumeral joint with increased osteophyte formation on the humeral head and acromion with narrowing of the subacromial space consistent with a rotator cuff tear.  There is benign periosteal reaction on the proximal  left humeral shaft at muscle insertions, unchanged.  IMPRESSION: Progressive degenerative changes of the left shoulder. No acute abnormality. Probable chronic rotator cuff tear.   Electronically Signed   By: Geanie CooleyJim  Maxwell M.D.   On: 10/16/2013 10:18    EKG Interpretation    Date/Time:  Monday October 16 2013 08:31:52 EST Ventricular Rate:  67 PR Interval:  245 QRS Duration: 161 QT Interval:  493 QTC Calculation: 520 R Axis:   -70 Text Interpretation:  Atrial-sensed ventricular-paced complexes No further analysis attempted due to paced rhythm Baseline wander in lead(s) V6 Confirmed by Denton LankSTEINL  MD, KEVIN ([id number]) on 10/16/2013 9:38:42 AM            MDM   Final diagnoses:  None   Patient is stable here in the emergency department.  She states she is feeling better following the by mouth fluids.  Patient be discharged home with followup with her primary care Dr. told to return here as needed  The patient has been seen by the attending Physician.    Carlyle Dollyhristopher W Adrienne Trombetta, PA-C 10/16/13 [phone]

## 2013-10-16 NOTE — ED Notes (Signed)
Pt encouraged to void when able. 

## 2013-10-17 LAB — URINE CULTURE
Colony Count: NO GROWTH
Culture: NO GROWTH

## 2013-10-18 NOTE — ED Provider Notes (Signed)
Medical screening examination/treatment/procedure(s) were conducted as a shared visit with non-physician practitioner(s) and myself.  I personally evaluated the patient during the encounter.  EKG Interpretation    Date/Time:  Monday October 16 2013 08:31:52 EST Ventricular Rate:  67 PR Interval:  245 QRS Duration: 161 QT Interval:  493 QTC Calculation: 520 R Axis:   -70 Text Interpretation:  Atrial-sensed ventricular-paced complexes No further analysis attempted due to paced rhythm Baseline wander in lead(s) V6 Confirmed by Tierney Behl  MD, Jamilex Bohnsack ([id number]) on 10/16/2013 9:38:42 AM            Pt c/o stumble and fall w left shoulder pain, ?hit head. No loc. Xr. Spine nt.   Suzi RootsKevin E Sharon Rubis, MD 10/18/13 1600

## 2013-10-30 ENCOUNTER — Encounter: Payer: Self-pay | Admitting: Cardiovascular Disease

## 2013-10-30 ENCOUNTER — Ambulatory Visit (INDEPENDENT_AMBULATORY_CARE_PROVIDER_SITE_OTHER): Payer: Medicare Other | Admitting: Cardiovascular Disease

## 2013-10-30 VITALS — BP 148/80 | HR 75 | Ht 68.0 in | Wt 209.8 lb

## 2013-10-30 DIAGNOSIS — I10 Essential (primary) hypertension: Secondary | ICD-10-CM

## 2013-10-30 DIAGNOSIS — I350 Nonrheumatic aortic (valve) stenosis: Secondary | ICD-10-CM

## 2013-10-30 DIAGNOSIS — I359 Nonrheumatic aortic valve disorder, unspecified: Secondary | ICD-10-CM

## 2013-10-30 DIAGNOSIS — I2510 Atherosclerotic heart disease of native coronary artery without angina pectoris: Secondary | ICD-10-CM

## 2013-10-30 DIAGNOSIS — E785 Hyperlipidemia, unspecified: Secondary | ICD-10-CM

## 2013-10-30 DIAGNOSIS — I441 Atrioventricular block, second degree: Secondary | ICD-10-CM

## 2013-10-30 NOTE — Progress Notes (Signed)
History of Present Illness: 80 yo [place] American female with a past medical history significant for coronary artery disease, status post placement of stents in the circumflex and right coronary arteries in 2005 by Dr. Juanda Chance, as well as hypertension, moderate AS, hyperlipidemia, obesity, diabetes mellitus and high grade AV block now s/p PPM, who comes in today for routine cardiac followup. She had a pacemaker placed by Dr. Johney Frame in May 2011 for second degree AV block. Carotid artery dopplers October 2011 with mild bilateral disease. Echo 09/13/12 with normal LV function with moderate AS. (AVA of 0.80 and mean gradient of 25-28).  She did have a fall February 2015 and was seen in the ED but there was no syncope or dizziness before the fall. She was felt to be dehydrated.   No chest pain or SOB. Mild LE edema. No near syncope or syncope. Overall doing well.   Primary Care Physician: Jarome Matin  Last Lipid Profile: Followed in primary care.   Past Medical History  Diagnosis Date  . DM (diabetes mellitus)   . Hyperlipidemia   . HTN (hypertension)   . CAD (coronary artery disease)     s/p NSTEMI 2003 with stent placement OM1.  PTCA with cutting balloon prox LAD. 2005 admission with chest pain, BMS placed to CFX and DES placed in RCA  . Aortic stenosis     moderate to severe  . Anemia   . CRI (chronic renal insufficiency)   . CVA (cerebral infarction)     remote; recovered  . Second degree Mobitz II AV block     s/p PPM 12/2009  . Hx of colonoscopy     Past Surgical History  Procedure Laterality Date  . Hysterectomy    . Benign vocal cord tumor removed    . Right knee surgery      x 3 with TKA  . Pacemaker insertion  01/10/2010    SJM Accent DR RF implanted by Dr Johney Frame    Current Outpatient Prescriptions  Medication Sig Dispense Refill  . aspirin EC 81 MG tablet Take 81 mg by mouth daily.      . carvedilol (COREG) 12.5 MG tablet Take 12.5 mg by mouth 2 (two) times daily  with a meal.      . donepezil (ARICEPT ODT) 10 MG disintegrating tablet Take 10 mg by mouth at bedtime.      . nitroGLYCERIN (NITROSTAT) 0.4 MG SL tablet Place 0.4 mg under the tongue every 5 (five) minutes as needed.        . rosuvastatin (CRESTOR) 10 MG tablet Take 1 tablet (10 mg total) by mouth daily.  30 tablet  0  . traMADol (ULTRAM) 50 MG tablet Take 50 mg by mouth every 6 (six) hours as needed for moderate pain.       No current facility-administered medications for this visit.    Allergies  Allergen Reactions  . Latex Rash    History   Social History  . Marital Status: Widowed    Spouse Name: N/A    Number of Children: 8  . Years of Education: N/A   Occupational History  . Not on file.   Social History Main Topics  . Smoking status: Former Smoker -- 0.50 packs/day for 30 years    Quit date: 08/31/1990  . Smokeless tobacco: Not on file  . Alcohol Use: No  . Drug Use: No  . Sexual Activity: Not on file   Other Topics Concern  . Not  on file   Social History Narrative  . No narrative on file    Family History  Problem Relation Age of Onset  . Coronary artery disease    . Hypertension    . Diabetes Mother   . Diabetes Father   . Heart disease Mother   . Colon cancer Neg Hx   . Stomach cancer Sister     Review of Systems:  As stated in the HPI and otherwise negative.   BP 148/80  Pulse 75  Ht 5\' 8"  (1.727 m)  Wt 209 lb 12.8 oz (95.165 kg)  BMI 31.91 kg/m2  Physical Examination: General: Well developed, well nourished, NAD HEENT: OP clear, mucus membranes moist SKIN: warm, dry. No rashes. Neuro: No focal deficits Musculoskeletal: Muscle strength 5/5 all ext Psychiatric: Mood and affect normal Neck: No JVD, no carotid bruits, no thyromegaly, no lymphadenopathy. Lungs:Clear bilaterally, no wheezes, rhonci, crackles Cardiovascular: Regular rate and rhythm. Harsh systolic murmur. No gallops or rubs. Abdomen:Soft. Bowel sounds present. Non-tender.    Extremities: No lower extremity edema. Pulses are 2 + in the bilateral DP/PT.   Echo 09/13/12:  Left ventricle: The cavity size was normal. There was moderate focal basal hypertrophy of the septum. Systolic function was normal. The estimated ejection fraction was in the range of 55% to 60%. Wall motion was normal; there were no regional wall motion abnormalities. Doppler parameters are consistent with abnormal left ventricular relaxation (grade 1 diastolic dysfunction). Doppler parameters are consistent with high ventricular filling pressure. - Aortic valve: Valve mobility was restricted. There was moderate stenosis. Mean gradient: 29mm Hg (S). Peak gradient: 47mm Hg (S). - Mitral valve: Calcified annulus. Mild regurgitation. - Atrial septum: There was an atrial septal aneurysm.   Assessment and Plan:   1. CAD: Stable. Will continue ASA, statin and beta blocker.    2. AORTIC STENOSIS: Asymptomatic. Moderate to severe AS by echo January 2014. Will repeat echo now. I have reviewed the signs of progression of her valve disease.   3. High grade heart block: PPM in place. Followed by Dr. Johney FrameAllred  4. HTN: BP is controlled and within appropriate range for her age group.   5. Hyperlipidemia: She is on a statin. Lipids followed in primary care.

## 2013-10-30 NOTE — Patient Instructions (Signed)

## 2013-11-17 ENCOUNTER — Other Ambulatory Visit (HOSPITAL_COMMUNITY): Payer: Medicare Other

## 2013-11-23 ENCOUNTER — Observation Stay (HOSPITAL_COMMUNITY)
Admission: EM | Admit: 2013-11-23 | Discharge: 2013-11-24 | Disposition: A | Discharge status: Home / Self Care | Payer: Medicare Other | Attending: Internal Medicine | Admitting: Internal Medicine

## 2013-11-23 ENCOUNTER — Emergency Department (HOSPITAL_COMMUNITY): Payer: Medicare Other

## 2013-11-23 ENCOUNTER — Other Ambulatory Visit: Payer: Self-pay

## 2013-11-23 ENCOUNTER — Encounter (HOSPITAL_COMMUNITY): Payer: Self-pay | Admitting: Emergency Medicine

## 2013-11-23 ENCOUNTER — Emergency Department (HOSPITAL_COMMUNITY)
Admission: EM | Admit: 2013-11-23 | Discharge: 2013-11-23 | Disposition: A | Discharge status: Home / Self Care | Payer: Medicare Other | Attending: Emergency Medicine | Admitting: Emergency Medicine

## 2013-11-23 DIAGNOSIS — R5381 Other malaise: Secondary | ICD-10-CM | POA: Insufficient documentation

## 2013-11-23 DIAGNOSIS — Z950 Presence of cardiac pacemaker: Secondary | ICD-10-CM | POA: Insufficient documentation

## 2013-11-23 DIAGNOSIS — I10 Essential (primary) hypertension: Secondary | ICD-10-CM | POA: Insufficient documentation

## 2013-11-23 DIAGNOSIS — N189 Chronic kidney disease, unspecified: Secondary | ICD-10-CM | POA: Insufficient documentation

## 2013-11-23 DIAGNOSIS — I129 Hypertensive chronic kidney disease with stage 1 through stage 4 chronic kidney disease, or unspecified chronic kidney disease: Secondary | ICD-10-CM | POA: Diagnosis not present

## 2013-11-23 DIAGNOSIS — M7989 Other specified soft tissue disorders: Secondary | ICD-10-CM | POA: Insufficient documentation

## 2013-11-23 DIAGNOSIS — J90 Pleural effusion, not elsewhere classified: Secondary | ICD-10-CM | POA: Insufficient documentation

## 2013-11-23 DIAGNOSIS — Z7982 Long term (current) use of aspirin: Secondary | ICD-10-CM | POA: Insufficient documentation

## 2013-11-23 DIAGNOSIS — Z87891 Personal history of nicotine dependence: Secondary | ICD-10-CM | POA: Insufficient documentation

## 2013-11-23 DIAGNOSIS — Z8673 Personal history of transient ischemic attack (TIA), and cerebral infarction without residual deficits: Secondary | ICD-10-CM | POA: Diagnosis not present

## 2013-11-23 DIAGNOSIS — I359 Nonrheumatic aortic valve disorder, unspecified: Secondary | ICD-10-CM | POA: Insufficient documentation

## 2013-11-23 DIAGNOSIS — I252 Old myocardial infarction: Secondary | ICD-10-CM | POA: Insufficient documentation

## 2013-11-23 DIAGNOSIS — I517 Cardiomegaly: Secondary | ICD-10-CM | POA: Insufficient documentation

## 2013-11-23 DIAGNOSIS — R011 Cardiac murmur, unspecified: Secondary | ICD-10-CM | POA: Diagnosis not present

## 2013-11-23 DIAGNOSIS — E7800 Pure hypercholesterolemia, unspecified: Secondary | ICD-10-CM | POA: Insufficient documentation

## 2013-11-23 DIAGNOSIS — R0989 Other specified symptoms and signs involving the circulatory and respiratory systems: Secondary | ICD-10-CM | POA: Insufficient documentation

## 2013-11-23 DIAGNOSIS — R609 Edema, unspecified: Secondary | ICD-10-CM | POA: Insufficient documentation

## 2013-11-23 DIAGNOSIS — R55 Syncope and collapse: Secondary | ICD-10-CM | POA: Insufficient documentation

## 2013-11-23 DIAGNOSIS — G309 Alzheimer's disease, unspecified: Secondary | ICD-10-CM | POA: Insufficient documentation

## 2013-11-23 DIAGNOSIS — I350 Nonrheumatic aortic (valve) stenosis: Secondary | ICD-10-CM

## 2013-11-23 DIAGNOSIS — Z9849 Cataract extraction status, unspecified eye: Secondary | ICD-10-CM | POA: Insufficient documentation

## 2013-11-23 DIAGNOSIS — Z862 Personal history of diseases of the blood and blood-forming organs and certain disorders involving the immune mechanism: Secondary | ICD-10-CM | POA: Diagnosis not present

## 2013-11-23 DIAGNOSIS — R0602 Shortness of breath: Secondary | ICD-10-CM | POA: Insufficient documentation

## 2013-11-23 DIAGNOSIS — Z961 Presence of intraocular lens: Secondary | ICD-10-CM | POA: Insufficient documentation

## 2013-11-23 DIAGNOSIS — I2510 Atherosclerotic heart disease of native coronary artery without angina pectoris: Secondary | ICD-10-CM | POA: Diagnosis not present

## 2013-11-23 DIAGNOSIS — Z9079 Acquired absence of other genital organ(s): Secondary | ICD-10-CM | POA: Insufficient documentation

## 2013-11-23 DIAGNOSIS — L988 Other specified disorders of the skin and subcutaneous tissue: Secondary | ICD-10-CM | POA: Diagnosis not present

## 2013-11-23 DIAGNOSIS — Z79899 Other long term (current) drug therapy: Secondary | ICD-10-CM | POA: Insufficient documentation

## 2013-11-23 DIAGNOSIS — I872 Venous insufficiency (chronic) (peripheral): Secondary | ICD-10-CM | POA: Insufficient documentation

## 2013-11-23 DIAGNOSIS — I441 Atrioventricular block, second degree: Secondary | ICD-10-CM | POA: Insufficient documentation

## 2013-11-23 DIAGNOSIS — R5383 Other fatigue: Secondary | ICD-10-CM

## 2013-11-23 DIAGNOSIS — D649 Anemia, unspecified: Secondary | ICD-10-CM | POA: Insufficient documentation

## 2013-11-23 DIAGNOSIS — E785 Hyperlipidemia, unspecified: Secondary | ICD-10-CM | POA: Diagnosis not present

## 2013-11-23 DIAGNOSIS — R0609 Other forms of dyspnea: Secondary | ICD-10-CM | POA: Insufficient documentation

## 2013-11-23 DIAGNOSIS — Z91040 Latex allergy status: Secondary | ICD-10-CM | POA: Diagnosis not present

## 2013-11-23 DIAGNOSIS — K219 Gastro-esophageal reflux disease without esophagitis: Secondary | ICD-10-CM | POA: Insufficient documentation

## 2013-11-23 DIAGNOSIS — I83009 Varicose veins of unspecified lower extremity with ulcer of unspecified site: Secondary | ICD-10-CM | POA: Diagnosis present

## 2013-11-23 DIAGNOSIS — E119 Type 2 diabetes mellitus without complications: Secondary | ICD-10-CM | POA: Insufficient documentation

## 2013-11-23 DIAGNOSIS — R600 Localized edema: Secondary | ICD-10-CM

## 2013-11-23 DIAGNOSIS — L97909 Non-pressure chronic ulcer of unspecified part of unspecified lower leg with unspecified severity: Secondary | ICD-10-CM | POA: Diagnosis present

## 2013-11-23 DIAGNOSIS — R29810 Facial weakness: Secondary | ICD-10-CM | POA: Insufficient documentation

## 2013-11-23 DIAGNOSIS — Z90710 Acquired absence of both cervix and uterus: Secondary | ICD-10-CM | POA: Insufficient documentation

## 2013-11-23 DIAGNOSIS — J811 Chronic pulmonary edema: Secondary | ICD-10-CM | POA: Insufficient documentation

## 2013-11-23 DIAGNOSIS — R4182 Altered mental status, unspecified: Principal | ICD-10-CM | POA: Diagnosis present

## 2013-11-23 DIAGNOSIS — F0280 Dementia in other diseases classified elsewhere without behavioral disturbance: Secondary | ICD-10-CM | POA: Insufficient documentation

## 2013-11-23 DIAGNOSIS — K5900 Constipation, unspecified: Secondary | ICD-10-CM | POA: Insufficient documentation

## 2013-11-23 LAB — CBC WITH DIFFERENTIAL/PLATELET
BASOS ABS: 0 10*3/uL (ref 0.0–0.1)
BASOS PCT: 0 % (ref 0–1)
Basophils Absolute: 0 10*3/uL (ref 0.0–0.1)
Basophils Relative: 1 % (ref 0–1)
EOS ABS: 0.1 10*3/uL (ref 0.0–0.7)
EOS ABS: 0 10*3/uL (ref 0.0–0.7)
Eosinophils Relative: 2 % (ref 0–5)
Eosinophils Relative: 1 % (ref 0–5)
HCT: 35.8 % — ABNORMAL LOW (ref 36.0–46.0)
HEMATOCRIT: 36.7 % (ref 36.0–46.0)
Hemoglobin: 11.5 g/dL — ABNORMAL LOW (ref 12.0–15.0)
Hemoglobin: 11.3 g/dL — ABNORMAL LOW (ref 12.0–15.0)
Lymphocytes Relative: 60 % — ABNORMAL HIGH (ref 12–46)
Lymphocytes Relative: 37 % (ref 12–46)
Lymphs Abs: 2.4 10*3/uL (ref 0.7–4.0)
Lymphs Abs: 1.2 10*3/uL (ref 0.7–4.0)
MCH: 26.2 pg (ref 26.0–34.0)
MCH: 26.1 pg (ref 26.0–34.0)
MCHC: 31.3 g/dL (ref 30.0–36.0)
MCHC: 31.6 g/dL (ref 30.0–36.0)
MCV: 83.6 fL (ref 78.0–100.0)
MCV: 82.7 fL (ref 78.0–100.0)
MONO ABS: 0.2 10*3/uL (ref 0.1–1.0)
MONOS PCT: 6 % (ref 3–12)
Monocytes Absolute: 0.2 10*3/uL (ref 0.1–1.0)
Monocytes Relative: 6 % (ref 3–12)
NEUTROS PCT: 57 % (ref 43–77)
Neutro Abs: 1.3 10*3/uL — ABNORMAL LOW (ref 1.7–7.7)
Neutro Abs: 1.9 10*3/uL (ref 1.7–7.7)
Neutrophils Relative %: 32 % — ABNORMAL LOW (ref 43–77)
PLATELETS: 92 10*3/uL — AB (ref 150–400)
Platelets: 99 10*3/uL — ABNORMAL LOW (ref 150–400)
RBC: 4.39 MIL/uL (ref 3.87–5.11)
RBC: 4.33 MIL/uL (ref 3.87–5.11)
RDW: 14.4 % (ref 11.5–15.5)
RDW: 14.2 % (ref 11.5–15.5)
WBC: 4 10*3/uL (ref 4.0–10.5)
WBC: 3.4 10*3/uL — ABNORMAL LOW (ref 4.0–10.5)

## 2013-11-23 LAB — BASIC METABOLIC PANEL
BUN: 13 mg/dL (ref 6–23)
CHLORIDE: 105 meq/L (ref 96–112)
CO2: 28 mEq/L (ref 19–32)
Calcium: 10 mg/dL (ref 8.4–10.5)
Creatinine, Ser: 1 mg/dL (ref 0.50–1.10)
GFR, EST AFRICAN AMERICAN: 60 mL/min — AB (ref >90)
GFR, EST NON AFRICAN AMERICAN: 51 mL/min — AB (ref >90)
Glucose, Bld: 105 mg/dL — ABNORMAL HIGH (ref 70–99)
POTASSIUM: 3.8 meq/L (ref 3.7–5.3)
Sodium: 144 mEq/L (ref 137–147)

## 2013-11-23 LAB — URINALYSIS, ROUTINE W REFLEX MICROSCOPIC
Bilirubin Urine: NEGATIVE
GLUCOSE, UA: NEGATIVE mg/dL
Hgb urine dipstick: NEGATIVE
KETONES UR: NEGATIVE mg/dL
LEUKOCYTES UA: NEGATIVE
Nitrite: NEGATIVE
PROTEIN: NEGATIVE mg/dL
Specific Gravity, Urine: 1.014 (ref 1.005–1.030)
UROBILINOGEN UA: 2 mg/dL — AB (ref 0.0–1.0)
pH: 7.5 (ref 5.0–8.0)

## 2013-11-23 LAB — TROPONIN I: Troponin I: <0.3 ng/mL (ref <0.30)

## 2013-11-23 LAB — COMPREHENSIVE METABOLIC PANEL
ALT: 12 U/L (ref 0–35)
AST: 24 U/L (ref 0–37)
Albumin: 3.2 g/dL — ABNORMAL LOW (ref 3.5–5.2)
Alkaline Phosphatase: 71 U/L (ref 39–117)
BUN: 15 mg/dL (ref 6–23)
CO2: 27 mEq/L (ref 19–32)
Calcium: 9.8 mg/dL (ref 8.4–10.5)
Chloride: 104 mEq/L (ref 96–112)
Creatinine, Ser: 0.93 mg/dL (ref 0.50–1.10)
GFR calc non Af Amer: 56 mL/min — ABNORMAL LOW (ref >90)
GFR, EST AFRICAN AMERICAN: 65 mL/min — AB (ref >90)
GLUCOSE: 83 mg/dL (ref 70–99)
Potassium: 3.6 mEq/L — ABNORMAL LOW (ref 3.7–5.3)
SODIUM: 143 meq/L (ref 137–147)
TOTAL PROTEIN: 6.5 g/dL (ref 6.0–8.3)
Total Bilirubin: 0.6 mg/dL (ref 0.3–1.2)

## 2013-11-23 LAB — PRO B NATRIURETIC PEPTIDE
PRO B NATRI PEPTIDE: 1836 pg/mL — AB (ref 0–450)
Pro B Natriuretic peptide (BNP): 781.3 pg/mL — ABNORMAL HIGH (ref 0–450)
Pro B Natriuretic peptide (BNP): 1937 pg/mL — ABNORMAL HIGH (ref 0–450)

## 2013-11-23 LAB — GLUCOSE, CAPILLARY: Glucose-Capillary: 165 mg/dL — ABNORMAL HIGH (ref 70–99)

## 2013-11-23 LAB — I-STAT TROPONIN, ED: TROPONIN I, POC: 0.02 ng/mL (ref 0.00–0.08)

## 2013-11-23 LAB — CBG MONITORING, ED: Glucose-Capillary: 78 mg/dL (ref 70–99)

## 2013-11-23 MED ORDER — FUROSEMIDE 10 MG/ML IJ SOLN
20.0000 mg | Freq: Once | INTRAMUSCULAR | Status: AC
  Administered 2013-11-23: 20 mg via INTRAVENOUS
  Filled 2013-11-23: qty 4

## 2013-11-23 MED ORDER — INSULIN ASPART 100 UNIT/ML ~~LOC~~ SOLN: 0.0000 [IU] | Freq: Three times a day (TID) | SUBCUTANEOUS | Status: DC

## 2013-11-23 MED ORDER — ONDANSETRON HCL 4 MG PO TABS: 4.0000 mg | ORAL_TABLET | Freq: Four times a day (QID) | ORAL | Status: DC | PRN

## 2013-11-23 MED ORDER — NITROGLYCERIN 0.4 MG SL SUBL: 0.4000 mg | SUBLINGUAL_TABLET | SUBLINGUAL | Status: DC | PRN

## 2013-11-23 MED ORDER — ASPIRIN EC 81 MG PO TBEC
81.0000 mg | DELAYED_RELEASE_TABLET | Freq: Every day | ORAL | Status: DC
  Administered 2013-11-23 – 2013-11-24 (×2): 81 mg via ORAL
  Filled 2013-11-23 (×2): qty 1

## 2013-11-23 MED ORDER — FUROSEMIDE 40 MG PO TABS
40.0000 mg | ORAL_TABLET | Freq: Every day | ORAL | Status: DC
  Administered 2013-11-23 – 2013-11-24 (×2): 40 mg via ORAL
  Filled 2013-11-23 (×2): qty 1

## 2013-11-23 MED ORDER — SODIUM CHLORIDE 0.9 % IJ SOLN
3.0000 mL | Freq: Two times a day (BID) | INTRAMUSCULAR | Status: DC
  Administered 2013-11-23 – 2013-11-24 (×2): 3 mL via INTRAVENOUS

## 2013-11-23 MED ORDER — ENOXAPARIN SODIUM 40 MG/0.4ML ~~LOC~~ SOLN
40.0000 mg | SUBCUTANEOUS | Status: DC
  Administered 2013-11-23: 40 mg via SUBCUTANEOUS
  Filled 2013-11-23 (×2): qty 0.4

## 2013-11-23 MED ORDER — DONEPEZIL HCL 10 MG PO TBDP
10.0000 mg | ORAL_TABLET | Freq: Every day | ORAL | Status: DC
Start: 2013-11-23 — End: 2013-11-23

## 2013-11-23 MED ORDER — POLYETHYLENE GLYCOL 3350 17 G PO PACK
17.0000 g | PACK | Freq: Every day | ORAL | Status: DC | PRN
  Filled 2013-11-23: qty 1

## 2013-11-23 MED ORDER — DONEPEZIL HCL 10 MG PO TABS
10.0000 mg | ORAL_TABLET | Freq: Every day | ORAL | Status: DC
  Administered 2013-11-23: 10 mg via ORAL
  Filled 2013-11-23 (×2): qty 1

## 2013-11-23 MED ORDER — GABAPENTIN 300 MG PO CAPS
300.0000 mg | ORAL_CAPSULE | Freq: Two times a day (BID) | ORAL | Status: DC
  Administered 2013-11-23 – 2013-11-24 (×2): 300 mg via ORAL
  Filled 2013-11-23 (×3): qty 1

## 2013-11-23 MED ORDER — ACETAMINOPHEN 325 MG PO TABS
650.0000 mg | ORAL_TABLET | Freq: Four times a day (QID) | ORAL | Status: DC | PRN
  Administered 2013-11-23: 650 mg via ORAL
  Filled 2013-11-23: qty 2

## 2013-11-23 MED ORDER — ACETAMINOPHEN 650 MG RE SUPP: 650.0000 mg | Freq: Four times a day (QID) | RECTAL | Status: DC | PRN

## 2013-11-23 MED ORDER — ATORVASTATIN CALCIUM 40 MG PO TABS
40.0000 mg | ORAL_TABLET | Freq: Every day | ORAL | Status: DC
  Administered 2013-11-24: 40 mg via ORAL
  Filled 2013-11-23: qty 1

## 2013-11-23 MED ORDER — CARVEDILOL 12.5 MG PO TABS
12.5000 mg | ORAL_TABLET | Freq: Two times a day (BID) | ORAL | Status: DC
  Administered 2013-11-23 – 2013-11-24 (×3): 12.5 mg via ORAL
  Filled 2013-11-23 (×4): qty 1

## 2013-11-23 MED ORDER — TRAMADOL HCL 50 MG PO TABS
50.0000 mg | ORAL_TABLET | Freq: Four times a day (QID) | ORAL | Status: DC | PRN
  Administered 2013-11-23: 50 mg via ORAL
  Filled 2013-11-23: qty 1

## 2013-11-23 MED ORDER — FUROSEMIDE 20 MG PO TABS: 10.0000 mg | ORAL_TABLET | Freq: Every day | ORAL | Status: DC

## 2013-11-23 MED ORDER — ONDANSETRON HCL 4 MG/2ML IJ SOLN: 4.0000 mg | Freq: Four times a day (QID) | INTRAMUSCULAR | Status: DC | PRN

## 2013-11-23 NOTE — H&P (Signed)
PCP:   Garlan FillersPATERSON,DANIEL G, MD   Chief Complaint:  Swelling in legs, altered mental status  HPI: [name] is a 80 year old African American female with history of CAD, moderate AS, and prior pacemaker placement who presented to the emergency department with complaint of altered mental status. The patient was asked to see in emergency department overnight for increased motion the swelling and drainage from her legs. Her family noted a significant increase in bilateral lower extremity edema stating that her legs had increased 1-1/2 times normal. This seems to develop in the last several days. Is associated with some weeping from her lower extremities. Her son brought her to the emergency room last night around 12:30 AM where she remained until this morning. Evaluation at that time showed mild pulmonary edema on chest x-ray. She did complain of some shortness of breath during her ER stay.  She was noted to have a transient episode of decreased level of consciousness in the emergency department which occurred after phlebotomy. At that time she complained of shortness of breath and weakness. Blood sugar was 78. Head CT was obtained that showed no acute findings. Symptoms resolved within 5 minutes and she was back to baseline before discharge. She was discharged on oral Lasix. Shortly after arrival home she was noted to have another episode of decreased level of consciousness. The episode lasted about 20 minutes during which time she was minimally responsive. EMS was called and evaluated the patient. Elevated her head and ultimately she became unresponsive. There is no documentation of her blood sugar at that time. She was brought to the emergency department where her vital signs been stable. Evaluation shows a significant increase in her BNP since this morning a chest x-ray shows no pulmonary edema. Her O2 sats are 99%. Given the repeated visits to the emergency department she is being admitted for further  evaluation. Patient's history is limited by her dementia but she is currently without complaint.  Review of Systems:  Review of Systems - All systems reviewed the patient and are negative Ceftin history of present illness the limited by her dementia   Past Medical History: HTN Pancreatitis-12/01 Weight Gain/Obesity Hypercholesterolemia CAD-2/03 Syncope, Hx of Anemia DM2 Hyperlipidemia Headaches-2004 temporal artey bx (-) Cough GERD-2003 Aortic Stenosis, moderately severe Syncope, resulting in November 2010 hospital admission November 2010 hospital admission for acute renal insufficiency, due to hypotension and severe bradycardia 11/12 Alzheimer's Disease, MMSE 18/30  Physicians involved in care:  Sheryn Bisonavid Patterson, [place], [place], Woods/Bernsdorf (oph), Aluisio   Past Surgical History BTL TAH, BSO VC Polyps 1997 R TKR 2/03 PTCA         Laser Rx OD 8/05 PTCA/ stent distal cfx/RCA 1/07 redo R TKR 3/07 R TKR 2/11 EGD and [place] (showed diverticulosis) 5/11 Cardiac Pacemaker placement (bradycardia) 10/19/11 Left Cataract and lens 11/09/11 Right Cataract and lens  Medications: Prior to Admission medications   Medication Sig Start Date End Date Taking? Authorizing Provider  aspirin EC 81 MG tablet Take 81 mg by mouth daily.   Yes Historical Provider, MD  carvedilol (COREG) 12.5 MG tablet Take 12.5 mg by mouth 2 (two) times daily with a meal.   Yes Historical Provider, MD  diclofenac sodium (VOLTAREN) 1 % GEL Apply 2 g topically 4 (four) times daily as needed (pain).   Yes Historical Provider, MD  donepezil (ARICEPT ODT) 10 MG disintegrating tablet Take 10 mg by mouth at bedtime.   Yes Historical Provider, MD  furosemide (LASIX) 20 MG tablet Take  0.5 tablets (10 mg total) by mouth daily. 11/23/13  Yes Gwyneth Sprout, MD  gabapentin (NEURONTIN) 300 MG capsule Take 300 mg by mouth 2 (two) times daily.   Yes Historical Provider, MD  nitroGLYCERIN (NITROSTAT) 0.4 MG SL tablet  Place 0.4 mg under the tongue every 5 (five) minutes as needed for chest pain.    Yes Historical Provider, MD  rosuvastatin (CRESTOR) 10 MG tablet Take 1 tablet (10 mg total) by mouth daily. 08/18/13  Yes Kathleene Hazel, MD  traMADol (ULTRAM) 50 MG tablet Take 50 mg by mouth every 6 (six) hours as needed for moderate pain.   Yes Historical Provider, MD    Allergies:   Allergies  Allergen Reactions  . Latex Rash    Social History: Widow-lives with 2 sons, 8 children, Retired, Research officer, trade [employer], [place]     Family History: Father (D) 6, DM2, Natural death Mother (D) 3, Acute Renal Failure Children, 5 sons & 3 daughters  Physical Exam: Filed Vitals:   11/23/13 1700 11/23/13 1710 11/23/13 1738 11/23/13 1840  BP: 138/65  136/62 154/68  Pulse:   60 60  Temp:      TempSrc:      Resp: 12  16 16   SpO2:  100% 100% 100%   General appearance: alert and no distress Head: Normocephalic, without obvious abnormality, atraumatic Eyes: conjunctivae/corneas clear. PERRL, EOM's intact.  Nose: Nares normal. Septum midline. Mucosa normal. No drainage or sinus tenderness. Throat: lips, mucosa, and tongue normal; teeth and gums normal Neck: no adenopathy, no carotid bruit, no JVD and thyroid not enlarged, symmetric, no tenderness/mass/nodules Resp: clear to auscultation bilaterally Cardio: regular rate and rhythm and Grade 3/6 systolic ejection murmur GI: soft, non-tender; bowel sounds normal; no masses,  no organomegaly Extremities: 1+ bilateral lower extremity edema; right anterior shin drainage Pulses: 2+ and symmetric Lymph nodes: Cervical adenopathy: no cervical lymphadenopathy Neurologic: Apparent dementia ; normal strength and tone.   Labs on Admission:   Recent Labs  11/23/13 0645 11/23/13 1642  NA 143 144  K 3.6* 3.8  CL 104 105  CO2 27 28  GLUCOSE 83 105*  BUN 15 13  CREATININE 0.93 1.00  CALCIUM 9.8 10.0    Recent Labs  11/23/13 0645  AST 24  ALT 12  ALKPHOS  71  BILITOT 0.6  PROT 6.5  ALBUMIN 3.2*   No results found for this basename: LIPASE, AMYLASE,  in the last 72 hours  Recent Labs  11/23/13 0645 11/23/13 1642  WBC 4.0 3.4*  NEUTROABS 1.3* 1.9  HGB 11.5* 11.3*  HCT 36.7 35.8*  MCV 83.6 82.7  PLT 92* 99*    Recent Labs  11/23/13 1642  TROPONINI <0.30   Lab Results  Component Value Date   INR 1.1 ratio* 01/06/2010   INR 1.12 12/15/2009    BNP (last 3 results)  Recent Labs  07/01/13 1735 11/23/13 0645 11/23/13 1642  PROBNP 1541.0* 781.3* 1836.0*  1937.0*    Radiological Exams on Admission: Dg Chest 2 View  11/23/2013   CLINICAL DATA:  Unresponsive.  EXAM: CHEST  2 VIEW  COMPARISON:  DG CHEST 2 VIEW dated 11/23/2013  FINDINGS: Mediastinum and hilar structures normal. Borderline cardiomegaly. Pulmonary vascularity unremarkable. Cardiac pacer with lead tips in right atrium and right ventricle. No pleural effusion or pneumothorax. No focal pulmonary infiltrate. Poor inspiration. Degenerative changes thoracic spine and both shoulders.  IMPRESSION: 1. Mild cardiomegaly. Cardiac pacer noted. No evidence of overt congestive heart failure. 2. No acute pulmonary  infiltrates.   Electronically Signed   By: Maisie Fus  Register   On: 11/23/2013 17:37   Dg Chest 2 View  11/23/2013   CLINICAL DATA:  Leg swelling  EXAM: CHEST  2 VIEW  COMPARISON:  In prior radiograph from 07/01/2013  FINDINGS: Left-sided transvenous pacemaker/ AICD is unchanged. There is accentuation of the cardiac silhouette, likely related to shallow lung inflation an AP technique. Mediastinal silhouette within normal limits.  The lungs are hypoinflated. There is diffuse pulmonary vascular congestion with indistinctness of the interstitial markings, suggestive of mild edema. Blunting of the right costophrenic angle is suggestive of small right pleural effusion. No focal infiltrate. No pneumothorax.  Osseous structures are within normal limits.  IMPRESSION: 1. Diffuse pulmonary  vascular congestion with indistinctness of the interstitial markings, suggestive of mild pulmonary edema. 2. Small right pleural effusion.   Electronically Signed   By: Rise Mu M.D.   On: 11/23/2013 04:28   Ct Head Wo Contrast  11/23/2013   CLINICAL DATA:  Weakness  EXAM: CT HEAD WITHOUT CONTRAST  TECHNIQUE: Contiguous axial images were obtained from the base of the skull through the vertex without intravenous contrast.  COMPARISON:  10/16/2013  FINDINGS: The bony calvarium is intact. No gross soft tissue abnormality is noted. Diffuse atrophic changes are seen is rales chronic white matter ischemic change. No findings to suggest acute hemorrhage, acute infarction or space-occupying mass lesion are noted.  IMPRESSION: Chronic changes similar to that seen on the prior exam. No acute abnormality is noted.   Electronically Signed   By: Alcide Clever M.D.   On: 11/23/2013 07:50   Orders placed during the hospital encounter of 11/23/13  . ED EKG  . ED EKG- paced     Assessment/Plan Principal Problem: 1. Altered mental status-recurrent episodes of altered mental status over past 24 hours may be near syncope. The first was thought secondary to mild hypoglycemia versus vasovagal related to needle stick. Etiology of the second episode is less apparent. Her aortic stenosis has been moderate in the past so I doubt that it is progressed to the point that that it is causing syncope/near syncope. Will obtain echocardiogram for further evaluation and to evaluate her ejection fraction given the increase in lower extremity edema. Will monitor on telemetry overnight and rule out for myocardial infarction with serial cardiac enzymes. 2. Edema/SOB (shortness of breath)-improved-we'll continue Lasix but avoid volume depletion the setting of her aortic stenosis. Obtain echocardiogram for further evaluation. 3. Type II or unspecified type diabetes mellitus without mention of complication, not stated as  uncontrolled-we'll continue her metformin. Check CBG q. a.c. and each bedtime and cover with sliding-scale insulin 4. AORTIC STENOSIS-obtain echocardiogram 5. Pacemaker-St.Jude-the EKG shows paced rhythm. Consider pacemaker interrogation tomorrow. 6. Alzheimers Dementia- continue Aricept. 7. Ethics-I discussed CODE STATUS with her family and they stated she would desire a full code. 8. Disposition-possible discharge tomorrow if stable. Family physician is additional assistance at home. We'll ask social work/case management to assist with personal care services.  Martha Clan 11/23/2013, 7:31 PM

## 2013-11-23 NOTE — ED Notes (Signed)
Applied TED hose to pt's legs.   Took pt crackers, peanut butter and ginger ale. Pt repositioned to eat. Then explained to pt and her family that after her snack will be coming back to ambulate pt.

## 2013-11-23 NOTE — ED Notes (Signed)
Pt to CT

## 2013-11-23 NOTE — Discharge Instructions (Signed)
Aortic Stenosis Aortic stenosis is a narrowing of the aortic valve. The aortic valve is a gatelike structure that is located between the lower left chamber of the heart (left ventricle) and the blood vessel that leads away from the heart (aorta). When the aortic valve is narrowed, it does not open all the way. This makes it hard for the heart to pump blood into the aorta and causes the heart to work harder. The extra work can weaken the heart over time and lead to heart failure. CAUSES  Causes of aortic stenosis include:  Calcium deposits on the aortic valve that have made the valve stiff. This condition generally affects those over the age of 19. It is the most common cause of aortic stenosis.  A birth defect.  Rheumatic fever. This is a problem that may occur after a strep throat infection that was not treated adequately. Rheumatic fever can cause permanent damage to heart valves. SYMPTOMS  People with aortic stenosis usually have no symptoms until the condition becomes severe. It may take 10 20 years for mild or moderate aortic stenosis to become severe. Symptoms may include:   Shortness of breath, especially with physical activity.   Feeling weak and tired (fatigued) or getting tired easily.  Chest discomfort (angina). This may occur with minimal activity if the aortic stenosis is severe.  An irregular or faster-than-normal heartbeat.  Dizziness or fainting that happens with exertion or after taking certain heart medicines (such as nitroglycerin). DIAGNOSIS  Aortic stenosis is usually diagnosed with a physical exam and with a type of imaging test called echocardiography. During echocardiography, sound waves are used to evaluate how blood flows through the heart. If your caregiver suspects aortic stenosis but the test does not clearly show it, a procedure called cardiac catheterization may be done to diagnose the condition. Tests may also be done to evaluate heart function. They may  include:  Electrocardiography. During this test, the electrical impulses of the heart are recorded while you are lying down and sticky patches are placed on your chest, arms, and legs.  Stress tests. There is more than one type of stress test. If a stress test is needed, ask your caregiver about which type is best for you.  Blood tests. TREATMENT  Treatment depends on how severe the aortic stenosis is, your symptoms, and the problems it is causing.   Observation. If the aortic stenosis is mild, no treatment may be needed. However, you will need to have the condition checked regularly to make sure it is not getting worse or causing serious problems.  Surgery. Surgery to repair or replace the aortic valve is the most common treatment for aortic stenosis. Several types of surgeries are available. The most common are open-heart surgery and transcutaneous aortic valve replacement (TAVR). TAVR does not require that the chest be opened. It is usually performed on elderly patients and those who are not able to have open-heart surgery.  Medicines. Medicines may be given to keep symptoms from getting worse. Medicines cannot reverse aortic stenosis. HOME CARE INSTRUCTIONS   You may need to avoid certain types of physical activity. If your aortic stenosis is mild, you may need to avoid only strenuous activity. The more severe your aortic stenosis, the more activities you will need to avoid. Talk with your caregiver about the types of activity you should avoid.  Take all medicines as directed by your caregiver.  If you are a woman with aortic valve stenosis and want to get pregnant, talk  to your caregiver before you become pregnant.  If you are a woman with aortic valve stenosis and are pregnant, keep all follow-up appointments with all recommended caregivers.  Keep all follow-up appointments for tests, exams, and treatments. SEEK IMMEDIATE MEDICAL CARE IF:  You develop chest pain or tightness.    You develop shortness of breath or difficulty breathing.   You develop lightheadedness or faint.   It feels like your heartbeat is irregular or faster than normal.  You have a fever or persistent symptoms for more than 2 3 days. Document Released: 05/16/2003 Document Revised: 12/12/2012 Document Reviewed: 08/11/2012 [facility] Patient Information 2014 [place], [place].  Edema Edema is an abnormal build-up of fluids in tissues. Because this is partly dependent on gravity (water flows to the lowest place), it is more common in the legs and thighs (lower extremities). It is also common in the looser tissues, like around the eyes. Painless swelling of the feet and ankles is common and increases as a person ages. It may affect both legs and may include the calves or even thighs. When squeezed, the fluid may move out of the affected area and may leave a dent for a few moments. CAUSES   Prolonged standing or sitting in one place for extended periods of time. Movement helps pump tissue fluid into the veins, and absence of movement prevents this, resulting in edema.  Varicose veins. The valves in the veins do not work as well as they should. This causes fluid to leak into the tissues.  Fluid and salt overload.  Injury, burn, or surgery to the leg, ankle, or foot, may damage veins and allow fluid to leak out.  Sunburn damages vessels. Leaky vessels allow fluid to go out into the sunburned tissues.  Allergies (from insect bites or stings, medications or chemicals) cause swelling by allowing vessels to become leaky.  Protein in the blood helps keep fluid in your vessels. Low protein, as in malnutrition, allows fluid to leak out.  Hormonal changes, including pregnancy and menstruation, cause fluid retention. This fluid may leak out of vessels and cause edema.  Medications that cause fluid retention. Examples are sex hormones, blood pressure medications, steroid treatment, or  anti-depressants.  Some illnesses cause edema, especially heart failure, kidney disease, or liver disease.  Surgery that cuts veins or lymph nodes, such as surgery done for the heart or for breast cancer, may result in edema. DIAGNOSIS  Your caregiver is usually easily able to determine what is causing your swelling (edema) by simply asking what is wrong (getting a history) and examining you (doing a physical). Sometimes x-rays, EKG (electrocardiogram or heart tracing), and blood work may be done to evaluate for underlying medical illness. TREATMENT  General treatment includes:  Leg elevation (or elevation of the affected body part).  Restriction of fluid intake.  Prevention of fluid overload.  Compression of the affected body part. Compression with elastic bandages or support stockings squeezes the tissues, preventing fluid from entering and forcing it back into the blood vessels.  Diuretics (also called water pills or fluid pills) pull fluid out of your body in the form of increased urination. These are effective in reducing the swelling, but can have side effects and must be used only under your caregiver's supervision. Diuretics are appropriate only for some types of edema. The specific treatment can be directed at any underlying causes discovered. Heart, liver, or kidney disease should be treated appropriately. HOME CARE INSTRUCTIONS   Elevate the legs (or affected body  part) above the level of the heart, while lying down.  Avoid sitting or standing still for prolonged periods of time.  Avoid putting anything directly under the knees when lying down, and do not wear constricting clothing or garters on the upper legs.  Exercising the legs causes the fluid to work back into the veins and lymphatic channels. This may help the swelling go down.  The pressure applied by elastic bandages or support stockings can help reduce ankle swelling.  A low-salt diet may help reduce fluid  retention and decrease the ankle swelling.  Take any medications exactly as prescribed. SEEK MEDICAL CARE IF:  Your edema is not responding to recommended treatments. SEEK IMMEDIATE MEDICAL CARE IF:   You develop shortness of breath or chest pain.  You cannot breathe when you lay down; or if, while lying down, you have to get up and go to the window to get your breath.  You are having increasing swelling without relief from treatment.  You develop a fever over 102 F (38.9 C).  You develop pain or redness in the areas that are swollen.  Tell your caregiver right away if you have gained 03 lb/1.4 kg in 1 day or 05 lb/2.3 kg in a week. MAKE SURE YOU:   Understand these instructions.  Will watch your condition.  Will get help right away if you are not doing well or get worse. Document Released: 08/17/2005 Document Revised: 02/16/2012 Document Reviewed: 04/04/2008 [facility] Patient Information 2014 [place].

## 2013-11-23 NOTE — ED Notes (Signed)
IV team RN paged per patient request

## 2013-11-23 NOTE — ED Notes (Signed)
Patient ambulated well to bathroom.

## 2013-11-23 NOTE — ED Provider Notes (Signed)
CSN: [id number]     Arrival date & time 11/23/13  1523 History   First MD Initiated Contact with Patient 11/23/13 1543     Chief Complaint  Patient presents with  . Shortness of Breath     (Consider location/radiation/quality/duration/timing/severity/associated sxs/prior Treatment) HPI  This is an 80 year old female with history of diabetes, hyperlipidemia, hypertension, coronary artery disease who presents with shortness of breath and an episode of altered mental status. Per the patient's daughter, they were seen earlier today for a left leg wound.  She did develop shortness of breath while in the ER this morning. She had workup with chest x-ray and BNP. She is discharged home with Lasix for a couple days as her workup was suggestive of mild pulmonary edema. Patient's family reports that they got her home yesterday. At approximately 1 PM, patient had episode of increased work of breathing and shortness of breath. She also appear to be less responsive to family members. She was not noted to have any difficulty speaking or any focal weakness or numbness. On my exam, patient states that she feels short of breath. It is worse with exertion. She denies any chest pain. Patient also reports orthopnea.  Past Medical History  Diagnosis Date  . DM (diabetes mellitus)   . Hyperlipidemia   . HTN (hypertension)   . CAD (coronary artery disease)     s/p NSTEMI 2003 with stent placement OM1.  PTCA with cutting balloon prox LAD. 2005 admission with chest pain, BMS placed to CFX and DES placed in RCA  . Aortic stenosis     moderate to severe  . Anemia   . CRI (chronic renal insufficiency)   . CVA (cerebral infarction)     remote; recovered  . Second degree Mobitz II AV block     s/p PPM 12/2009  . Hx of colonoscopy    Past Surgical History  Procedure Laterality Date  . Hysterectomy    . Benign vocal cord tumor removed    . Right knee surgery      x 3 with TKA  . Pacemaker insertion  01/10/2010   SJM Accent DR RF implanted by Dr Johney Frame   Family History  Problem Relation Age of Onset  . Coronary artery disease    . Hypertension    . Diabetes Mother   . Diabetes Father   . Heart disease Mother   . Colon cancer Neg Hx   . Stomach cancer Sister    History  Substance Use Topics  . Smoking status: Former Smoker -- 0.50 packs/day for 30 years    Quit date: 08/31/1990  . Smokeless tobacco: Not on file  . Alcohol Use: No   OB History   Grav Para Term Preterm Abortions TAB SAB Ect Mult Living                 Review of Systems  Constitutional: Negative for fever.  Respiratory: Positive for shortness of breath. Negative for cough and chest tightness.        Orthopnea, dyspnea on exertion  Cardiovascular: Positive for leg swelling. Negative for chest pain and palpitations.  Gastrointestinal: Negative for nausea, vomiting and abdominal pain.  Genitourinary: Negative for dysuria.  Musculoskeletal: Negative for back pain.  Skin: Positive for wound.  Neurological: Negative for speech difficulty, weakness and headaches.  Psychiatric/Behavioral: Positive for confusion.  All other systems reviewed and are negative.      Allergies  Latex  Home Medications   Current Outpatient Rx  Name  Route  Sig  Dispense  Refill  . aspirin EC 81 MG tablet   Oral   Take 81 mg by mouth daily.         . carvedilol (COREG) 12.5 MG tablet   Oral   Take 12.5 mg by mouth 2 (two) times daily with a meal.         . diclofenac sodium (VOLTAREN) 1 % GEL   Topical   Apply 2 g topically 4 (four) times daily as needed (pain).         Marland Kitchen donepezil (ARICEPT ODT) 10 MG disintegrating tablet   Oral   Take 10 mg by mouth at bedtime.         . furosemide (LASIX) 20 MG tablet   Oral   Take 0.5 tablets (10 mg total) by mouth daily.   4 tablet   0   . gabapentin (NEURONTIN) 300 MG capsule   Oral   Take 300 mg by mouth 2 (two) times daily.         . nitroGLYCERIN (NITROSTAT) 0.4 MG SL  tablet   Sublingual   Place 0.4 mg under the tongue every 5 (five) minutes as needed for chest pain.          . rosuvastatin (CRESTOR) 10 MG tablet   Oral   Take 1 tablet (10 mg total) by mouth daily.   30 tablet   0     NO FURTHER REFILLS WILL BE GRANTED WITHOUT APPOINT .Marland Kitchen.   . traMADol (ULTRAM) 50 MG tablet   Oral   Take 50 mg by mouth every 6 (six) hours as needed for moderate pain.          BP 138/56  Pulse 60  Temp(Src) 97.9 F (36.6 C) (Oral)  Resp 18  SpO2 99% Physical Exam  Nursing note and vitals reviewed. Constitutional: She is oriented to person, place, and time.  Elderly, chronically ill-appearing  HENT:  Head: Normocephalic and atraumatic.  Mouth/Throat: Oropharynx is clear and moist.  Eyes: Pupils are equal, round, and reactive to light.  Neck: Neck supple. JVD present.  Cardiovascular: Normal rate, regular rhythm and normal heart sounds.   No murmur heard. Pulmonary/Chest: Effort normal. No respiratory distress.  Fair movement, fine crackles noted in the bases  Abdominal: Soft. Bowel sounds are normal. There is no tenderness. There is no rebound and no guarding.  Musculoskeletal:  2 post bilateral lower remedy edema, TED hose in place, wounds dressed over the left lower extremity  Neurological: She is alert and oriented to person, place, and time.  Equal and symmetric strength in the bilateral upper and lower extremities, no clonus noted, fluent speech  Skin: Skin is warm and dry.  Psychiatric: She has a normal mood and affect.    ED Course  Procedures (including critical care time) Labs Review Labs Reviewed  CBC WITH DIFFERENTIAL - Abnormal; Notable for the following:    WBC 3.4 (*)    Hemoglobin 11.3 (*)    HCT 35.8 (*)    Platelets 99 (*)    All other components within normal limits  BASIC METABOLIC PANEL - Abnormal; Notable for the following:    Glucose, Bld 105 (*)    GFR calc non Af Amer 51 (*)    GFR calc Af Amer 60 (*)    All other  components within normal limits  PRO B NATRIURETIC PEPTIDE - Abnormal; Notable for the following:    Pro B Natriuretic peptide (BNP)  1836.0 (*)    All other components within normal limits  URINALYSIS, ROUTINE W REFLEX MICROSCOPIC - Abnormal; Notable for the following:    APPearance CLOUDY (*)    Urobilinogen, UA 2.0 (*)    All other components within normal limits  PRO B NATRIURETIC PEPTIDE - Abnormal; Notable for the following:    Pro B Natriuretic peptide (BNP) 1937.0 (*)    All other components within normal limits  BASIC METABOLIC PANEL - Abnormal; Notable for the following:    Potassium 3.6 (*)    GFR calc non Af Amer 52 (*)    GFR calc Af Amer 60 (*)    All other components within normal limits  CBC - Abnormal; Notable for the following:    Hemoglobin 11.4 (*)    MCH 25.8 (*)    Platelets 100 (*)    All other components within normal limits  PRO B NATRIURETIC PEPTIDE - Abnormal; Notable for the following:    Pro B Natriuretic peptide (BNP) 1907.0 (*)    All other components within normal limits  HEMOGLOBIN A1C - Abnormal; Notable for the following:    Hemoglobin A1C 6.0 (*)    Mean Plasma Glucose 126 (*)    All other components within normal limits  GLUCOSE, CAPILLARY - Abnormal; Notable for the following:    Glucose-Capillary 165 (*)    All other components within normal limits  GLUCOSE, CAPILLARY - Abnormal; Notable for the following:    Glucose-Capillary 115 (*)    All other components within normal limits  TROPONIN I  TROPONIN I  TROPONIN I  TROPONIN I  GLUCOSE, CAPILLARY   Imaging Review Dg Chest 2 View  11/23/2013   CLINICAL DATA:  Leg swelling  EXAM: CHEST  2 VIEW  COMPARISON:  In prior radiograph from 07/01/2013  FINDINGS: Left-sided transvenous pacemaker/ AICD is unchanged. There is accentuation of the cardiac silhouette, likely related to shallow lung inflation an AP technique. Mediastinal silhouette within normal limits.  The lungs are hypoinflated. There  is diffuse pulmonary vascular congestion with indistinctness of the interstitial markings, suggestive of mild edema. Blunting of the right costophrenic angle is suggestive of small right pleural effusion. No focal infiltrate. No pneumothorax.  Osseous structures are within normal limits.  IMPRESSION: 1. Diffuse pulmonary vascular congestion with indistinctness of the interstitial markings, suggestive of mild pulmonary edema. 2. Small right pleural effusion.   Electronically Signed   By: Rise Mu M.D.   On: 11/23/2013 04:28   Ct Head Wo Contrast  11/23/2013   CLINICAL DATA:  Weakness  EXAM: CT HEAD WITHOUT CONTRAST  TECHNIQUE: Contiguous axial images were obtained from the base of the skull through the vertex without intravenous contrast.  COMPARISON:  10/16/2013  FINDINGS: The bony calvarium is intact. No gross soft tissue abnormality is noted. Diffuse atrophic changes are seen is rales chronic white matter ischemic change. No findings to suggest acute hemorrhage, acute infarction or space-occupying mass lesion are noted.  IMPRESSION: Chronic changes similar to that seen on the prior exam. No acute abnormality is noted.   Electronically Signed   By: Alcide Clever M.D.   On: 11/23/2013 07:50     EKG Interpretation None     EKG independently reviewed by myself: Paced rhythm with a rate of 60 MDM   Final diagnoses:  Shortness of breath   Patient presents with shortness of breath. Was seen and evaluated earlier today and had some workup suggestive of volume overload. At discharge she had  normal vital signs and was not hypoxic. She has had recurrence of shortness of breath. She does appear volume overloaded on exam. She is currently comfortable and is having 99% on room air.  EKG shows paced rhythm, troponin is negative, chest x-ray shows cardiomegaly with no overt signs of heart failure. BNP is elevated at 1836. This is an acute change from this morning. Given shortness of breath and physical  exam evidence of volume overload, will give 20 mg of IV Lasix. Given that this is patient's second episode of shortness of breath and her age as well as acute worsening of BNP, will admit for further diuresis and management. Patient is a patient of Dr. Eloise HarmanPaterson.    Shon Batonourtney F Case Vassell, MD 11/24/13 1435

## 2013-11-23 NOTE — ED Provider Notes (Addendum)
CSN: [id number]     Arrival date & time 11/23/13  0022 History   First MD Initiated Contact with Patient 11/23/13 0410     Chief Complaint  Patient presents with  . Leg Swelling     (Consider location/radiation/quality/duration/timing/severity/associated sxs/prior Treatment) HPI Comments: Pt presents with family with leg swelling for 24 hours and blisters and weeping of the left leg.  Pt states maybe she hit the leg on something but cannot rrecall and family does not recall any trauma.  States legs are 1.5 times larger than normal and up a lot yesterday on her feet.  She does have hx of aortic stenosis and currently be followed by cards and set up for repeat u/s soon.  Pt denies SOB, weakness, chest pain or other complaints at this time.  The history is provided by the patient and a caregiver.    Past Medical History  Diagnosis Date  . DM (diabetes mellitus)   . Hyperlipidemia   . HTN (hypertension)   . CAD (coronary artery disease)     s/p NSTEMI 2003 with stent placement OM1.  PTCA with cutting balloon prox LAD. 2005 admission with chest pain, BMS placed to CFX and DES placed in RCA  . Aortic stenosis     moderate to severe  . Anemia   . CRI (chronic renal insufficiency)   . CVA (cerebral infarction)     remote; recovered  . Second degree Mobitz II AV block     s/p PPM 12/2009  . Hx of colonoscopy    Past Surgical History  Procedure Laterality Date  . Hysterectomy    . Benign vocal cord tumor removed    . Right knee surgery      x 3 with TKA  . Pacemaker insertion  01/10/2010    SJM Accent DR RF implanted by Dr Johney Frame   Family History  Problem Relation Age of Onset  . Coronary artery disease    . Hypertension    . Diabetes Mother   . Diabetes Father   . Heart disease Mother   . Colon cancer Neg Hx   . Stomach cancer Sister    History  Substance Use Topics  . Smoking status: Former Smoker -- 0.50 packs/day for 30 years    Quit date: 08/31/1990  . Smokeless  tobacco: Not on file  . Alcohol Use: No   OB History   Grav Para Term Preterm Abortions TAB SAB Ect Mult Living                 Review of Systems  All other systems reviewed and are negative.      Allergies  Latex  Home Medications   Current Outpatient Rx  Name  Route  Sig  Dispense  Refill  . aspirin EC 81 MG tablet   Oral   Take 81 mg by mouth daily.         . carvedilol (COREG) 12.5 MG tablet   Oral   Take 12.5 mg by mouth 2 (two) times daily with a meal.         . diclofenac sodium (VOLTAREN) 1 % GEL   Topical   Apply 2 g topically 4 (four) times daily as needed (pain).         Marland Kitchen donepezil (ARICEPT ODT) 10 MG disintegrating tablet   Oral   Take 10 mg by mouth at bedtime.         . nitroGLYCERIN (NITROSTAT) 0.4 MG SL tablet  Sublingual   Place 0.4 mg under the tongue every 5 (five) minutes as needed for chest pain.          . rosuvastatin (CRESTOR) 10 MG tablet   Oral   Take 1 tablet (10 mg total) by mouth daily.   30 tablet   0     NO FURTHER REFILLS WILL BE GRANTED WITHOUT APPOINT .Marland Kitchen..   . traMADol (ULTRAM) 50 MG tablet   Oral   Take 50 mg by mouth every 6 (six) hours as needed for moderate pain.          BP 174/73  Pulse 60  Temp(Src) 97.8 F (36.6 C) (Oral)  Resp 18  Ht 5\' 8"  (1.727 m)  Wt 210 lb (95.255 kg)  BMI 31.94 kg/m2  SpO2 99% Physical Exam  Nursing note and vitals reviewed. Constitutional: She is oriented to person, place, and time. She appears well-developed and well-nourished. No distress.  HENT:  Head: Normocephalic and atraumatic.  Eyes: EOM are normal. Pupils are equal, round, and reactive to light.  Cardiovascular: Normal rate, regular rhythm and intact distal pulses.  Exam reveals no friction rub.   Murmur heard.  Systolic murmur is present with a grade of 4/6  Heard best at LSB  Pulmonary/Chest: Effort normal and breath sounds normal. She has no wheezes. She has no rales.  Pacer in the left upper chest wall   Abdominal: Soft. Bowel sounds are normal. She exhibits no distension. There is no tenderness. There is no rebound and no guarding.  Musculoskeletal: Normal range of motion. She exhibits edema. She exhibits no tenderness.  2+ pitting edema in bilateral lower ext.  Weeping blisters present mid shin on the left lower ext.  Non-tender without erythema.  Neurological: She is alert and oriented to person, place, and time. No cranial nerve deficit.  Skin: Skin is warm and dry. No rash noted.  Psychiatric: She has a normal mood and affect. Her behavior is normal.    ED Course  Procedures (including critical care time) Labs Review Labs Reviewed  CBC WITH DIFFERENTIAL - Abnormal; Notable for the following:    Hemoglobin 11.5 (*)    Platelets 92 (*)    Neutrophils Relative % 32 (*)    Neutro Abs 1.3 (*)    Lymphocytes Relative 60 (*)    All other components within normal limits  COMPREHENSIVE METABOLIC PANEL - Abnormal; Notable for the following:    Potassium 3.6 (*)    Albumin 3.2 (*)    GFR calc non Af Amer 56 (*)    GFR calc Af Amer 65 (*)    All other components within normal limits  PRO B NATRIURETIC PEPTIDE - Abnormal; Notable for the following:    Pro B Natriuretic peptide (BNP) 781.3 (*)    All other components within normal limits  CBG MONITORING, ED  I-STAT TROPOININ, ED   Imaging Review Dg Chest 2 View  11/23/2013   CLINICAL DATA:  Leg swelling  EXAM: CHEST  2 VIEW  COMPARISON:  In prior radiograph from 07/01/2013  FINDINGS: Left-sided transvenous pacemaker/ AICD is unchanged. There is accentuation of the cardiac silhouette, likely related to shallow lung inflation an AP technique. Mediastinal silhouette within normal limits.  The lungs are hypoinflated. There is diffuse pulmonary vascular congestion with indistinctness of the interstitial markings, suggestive of mild edema. Blunting of the right costophrenic angle is suggestive of small right pleural effusion. No focal infiltrate.  No pneumothorax.  Osseous structures are within normal  limits.  IMPRESSION: 1. Diffuse pulmonary vascular congestion with indistinctness of the interstitial markings, suggestive of mild pulmonary edema. 2. Small right pleural effusion.   Electronically Signed   By: Rise Mu M.D.   On: 11/23/2013 04:28   Ct Head Wo Contrast  11/23/2013   CLINICAL DATA:  Weakness  EXAM: CT HEAD WITHOUT CONTRAST  TECHNIQUE: Contiguous axial images were obtained from the base of the skull through the vertex without intravenous contrast.  COMPARISON:  10/16/2013  FINDINGS: The bony calvarium is intact. No gross soft tissue abnormality is noted. Diffuse atrophic changes are seen is rales chronic white matter ischemic change. No findings to suggest acute hemorrhage, acute infarction or space-occupying mass lesion are noted.  IMPRESSION: Chronic changes similar to that seen on the prior exam. No acute abnormality is noted.   Electronically Signed   By: Alcide Clever M.D.   On: 11/23/2013 07:50     EKG Interpretation None      MDM   Final diagnoses:  Lower extremity edema  Aortic stenosis   Patient presenting with worsening leg swelling over the last 24 hours with straightened to the left lower extremity. She denies any shortness of breath, chest pain or weakness.  Family states she has a history of aortic stenosis and is currently being followed by cardiology and is due for a new ultrasound soon. On exam patient has good circulation with 2+ DP pulses bilaterally and 2+ pitting edema in bilateral lower strandy is blistering and weeping of the shin of the left lower leg.     Chest x-ray with mild pulmonary edema with normal creatinine and BMP 780 which was prior 1500.  During blood stick pt began feeling SOB and weak with minor right facial droop without any other focal neuro findings.  BS 78 and head CT without acute findings and sx resolved within and pt feels back to normal.  Feel most likely near  syncope due to IV stick and prolonged time to obtain an IV.    Will ambulate pt to ensure no lightheaded/dizziness or SOB.  Low suspicion of stroke.  Feel pt would benefit from short coarse of lasix and f/u with cardiology.    Gwyneth Sprout, MD 11/23/13 1610  Gwyneth Sprout, MD 11/23/13 [phone]

## 2013-11-23 NOTE — ED Notes (Signed)
Per family they noticed bilat lower extremity swelling today, open weeping blisters noted to LLE. Pain noted.

## 2013-11-23 NOTE — ED Notes (Signed)
Natalie RN attempting IV with ultrasound at bedside.

## 2013-11-23 NOTE — ED Notes (Signed)
Called [employer] to get TED hose sent to ED for pt.

## 2013-11-23 NOTE — ED Notes (Signed)
Patient from home reporting exertional SOB since yesterday.  Patient was seen here yesterday and sent home but has worsened today.  EMS reports crackles in the bases and gurgling when lying down.  Patient was sent home with a prescription of lasix but family has not had it filled yet.  Patient denies pain, nausea, vomiting or fever.

## 2013-11-23 NOTE — ED Notes (Signed)
Bed: WA09 Expected date:  Expected time:  Means of arrival:  Comments: ems- resp distress, edema, CHF

## 2013-11-23 NOTE — ED Notes (Signed)
Dr Eloise Harmanpaterson paged to dr Wilkie [email or web address].[email or web address] [email or web address].[email or web address]

## 2013-11-23 NOTE — ED Notes (Signed)
2 unsuccessful attempts to draw labs. Pt states IV team normally has to draw labs. RN made aware

## 2013-11-24 DIAGNOSIS — I83009 Varicose veins of unspecified lower extremity with ulcer of unspecified site: Secondary | ICD-10-CM | POA: Diagnosis present

## 2013-11-24 DIAGNOSIS — I359 Nonrheumatic aortic valve disorder, unspecified: Secondary | ICD-10-CM

## 2013-11-24 DIAGNOSIS — L97909 Non-pressure chronic ulcer of unspecified part of unspecified lower leg with unspecified severity: Secondary | ICD-10-CM

## 2013-11-24 LAB — BASIC METABOLIC PANEL
BUN: 11 mg/dL (ref 6–23)
CALCIUM: 10.2 mg/dL (ref 8.4–10.5)
CO2: 32 mEq/L (ref 19–32)
CREATININE: 0.99 mg/dL (ref 0.50–1.10)
Chloride: 103 mEq/L (ref 96–112)
GFR, EST AFRICAN AMERICAN: 60 mL/min — AB (ref >90)
GFR, EST NON AFRICAN AMERICAN: 52 mL/min — AB (ref >90)
Glucose, Bld: 90 mg/dL (ref 70–99)
Potassium: 3.6 mEq/L — ABNORMAL LOW (ref 3.7–5.3)
Sodium: 143 mEq/L (ref 137–147)

## 2013-11-24 LAB — CBC
HCT: 36 % (ref 36.0–46.0)
Hemoglobin: 11.4 g/dL — ABNORMAL LOW (ref 12.0–15.0)
MCH: 25.8 pg — ABNORMAL LOW (ref 26.0–34.0)
MCHC: 31.7 g/dL (ref 30.0–36.0)
MCV: 81.4 fL (ref 78.0–100.0)
PLATELETS: 100 10*3/uL — AB (ref 150–400)
RBC: 4.42 MIL/uL (ref 3.87–5.11)
RDW: 14.3 % (ref 11.5–15.5)
WBC: 4 10*3/uL (ref 4.0–10.5)

## 2013-11-24 LAB — TROPONIN I: Troponin I: <0.3 ng/mL (ref <0.30)

## 2013-11-24 LAB — GLUCOSE, CAPILLARY
GLUCOSE-CAPILLARY: 76 mg/dL (ref 70–99)
Glucose-Capillary: 115 mg/dL — ABNORMAL HIGH (ref 70–99)

## 2013-11-24 LAB — PRO B NATRIURETIC PEPTIDE: Pro B Natriuretic peptide (BNP): 1907 pg/mL — ABNORMAL HIGH (ref 0–450)

## 2013-11-24 LAB — HEMOGLOBIN A1C
Hgb A1c MFr Bld: 6 % — ABNORMAL HIGH (ref <5.7)
MEAN PLASMA GLUCOSE: 126 mg/dL — AB (ref <117)

## 2013-11-24 MED ORDER — POTASSIUM CHLORIDE ER 10 MEQ PO TBCR: 10.0000 meq | EXTENDED_RELEASE_TABLET | Freq: Every day | ORAL | Status: DC

## 2013-11-24 NOTE — Progress Notes (Signed)
  Echocardiogram 2D Echocardiogram has been performed.  [name] 11/24/2013, 4:10 PM

## 2013-11-24 NOTE — Care Management Note (Signed)
    Page 1 of 1   11/24/2013     2:31:58 PM   CARE MANAGEMENT NOTE 11/24/2013  Patient:  Terence LuxHOKETT,Ladye S   Account Number:  [id number]  Date Initiated:  11/24/2013  Documentation initiated by:  [facility] LLC[facility]HABIR,Rockney Grenz  Subjective/Objective Assessment:   80 Y/O F ADMITTED W/SOB.ZO:[id number]'J,WJXHX:ALZHEIMER'S,CAD.     Action/Plan:   FROM HOME W/SON.HAS CANE,RW,3N1.   Anticipated DC Date:  11/25/2013   Anticipated DC Plan:  HOME W HOME HEALTH SERVICES      DC Planning Services  CM consult      Choice offered to / List presented to:  C-1 Patient           Status of service:  In process, will continue to follow Medicare Important Message given?   (If response is "NO", the following Medicare IM given date fields will be blank) Date Medicare IM given:   Date Additional Medicare IM given:    Discharge Disposition:    Per UR Regulation:  Reviewed for med. necessity/level of care/duration of stay  If discussed at Long Length of Stay Meetings, dates discussed:    Comments:  11/24/13 Boyd [place] RN,BSN NCM [phone] SPOKE TO SON-CHARLES IN RM ABOUT OBSERVATION STATUS.HE VOICED UNDERSTANDING.[facility]IN,SINCE HIS SISTER DENISE ALREADY HAS HHC LIST,& PRIVATE SITTER LIST ALSO.THEY WILL CHOOSE HHC AGENCY.

## 2013-11-24 NOTE — Progress Notes (Signed)
Patient dressed and transported to car via wheelchair by RN. Key points in discharge instructions gone over briefly as day RN already went over discharge instructions in detail. No further questions from patient/family.

## 2013-11-24 NOTE — Progress Notes (Signed)
Subjective: She feels little tired this morning, but is otherwise well.  She denied having problems with chest pain, palpitations, or shortness of breath leading to her weakness yesterday.  Objective: Vital signs in last 24 hours: Temp:  [97.5 F (36.4 C)-98.4 F (36.9 C)] 98.4 F (36.9 C) (03/27 0448) Pulse Rate:  [59-61] 60 (03/27 0815) Resp:  [12-23] 16 (03/27 0448) BP: (109-167)/(50-85) 151/83 mmHg (03/27 0815) SpO2:  [99 %-100 %] 100 % (03/27 0448) Weight:  [91.6 kg (201 lb 15.1 oz)-93.3 kg (205 lb 11 oz)] 91.6 kg (201 lb 15.1 oz) (03/27 0448) Weight change:    Intake/Output from previous day: 03/26 0701 - 03/27 0700 In: -  Out: 1775 [Urine:1775]   General appearance: alert, cooperative and no distress Resp: clear to auscultation bilaterally Cardio: regular rate and rhythm and with a grade 3/6 systolic ejection murmur GI: soft, non-tender; bowel sounds normal; no masses,  no organomegaly Extremities: bilateral trace leg edema  Lab Results:  Recent Labs  11/23/13 1642 11/24/13 0345  WBC 3.4* 4.0  HGB 11.3* 11.4*  HCT 35.8* 36.0  PLT 99* 100*   BMET  Recent Labs  11/23/13 1642 11/24/13 0345  NA 144 143  K 3.8 3.6*  CL 105 103  CO2 28 32  GLUCOSE 105* 90  BUN 13 11  CREATININE 1.00 0.99  CALCIUM 10.0 10.2   CMET CMP     Component Value Date/Time   NA 143 11/24/2013 0345   K 3.6* 11/24/2013 0345   CL 103 11/24/2013 0345   CO2 32 11/24/2013 0345   GLUCOSE 90 11/24/2013 0345   BUN 11 11/24/2013 0345   CREATININE 0.99 11/24/2013 0345   CALCIUM 10.2 11/24/2013 0345   CALCIUM 9.7 12/13/2009 1624   PROT 6.5 11/23/2013 0645   ALBUMIN 3.2* 11/23/2013 0645   AST 24 11/23/2013 0645   ALT 12 11/23/2013 0645   ALKPHOS 71 11/23/2013 0645   BILITOT 0.6 11/23/2013 0645   GFRNONAA 52* 11/24/2013 0345   GFRAA 60* 11/24/2013 0345    CBG (last 3)   Recent Labs  11/23/13 0704 11/23/13 2221 11/24/13 0801  GLUCAP 78 165* 76    INR RESULTS:   Lab Results  Component  Value Date   INR 1.1 ratio* 01/06/2010   INR 1.12 12/15/2009     Studies/Results: Dg Chest 2 View  11/23/2013   CLINICAL DATA:  Unresponsive.  EXAM: CHEST  2 VIEW  COMPARISON:  DG CHEST 2 VIEW dated 11/23/2013  FINDINGS: Mediastinum and hilar structures normal. Borderline cardiomegaly. Pulmonary vascularity unremarkable. Cardiac pacer with lead tips in right atrium and right ventricle. No pleural effusion or pneumothorax. No focal pulmonary infiltrate. Poor inspiration. Degenerative changes thoracic spine and both shoulders.  IMPRESSION: 1. Mild cardiomegaly. Cardiac pacer noted. No evidence of overt congestive heart failure. 2. No acute pulmonary infiltrates.   Electronically Signed   By: Maisie Fushomas  Register   On: 11/23/2013 17:37   Dg Chest 2 View  11/23/2013   CLINICAL DATA:  Leg swelling  EXAM: CHEST  2 VIEW  COMPARISON:  In prior radiograph from 07/01/2013  FINDINGS: Left-sided transvenous pacemaker/ AICD is unchanged. There is accentuation of the cardiac silhouette, likely related to shallow lung inflation an AP technique. Mediastinal silhouette within normal limits.  The lungs are hypoinflated. There is diffuse pulmonary vascular congestion with indistinctness of the interstitial markings, suggestive of mild edema. Blunting of the right costophrenic angle is suggestive of small right pleural effusion. No focal infiltrate. No pneumothorax.  Osseous structures are within normal limits.  IMPRESSION: 1. Diffuse pulmonary vascular congestion with indistinctness of the interstitial markings, suggestive of mild pulmonary edema. 2. Small right pleural effusion.   Electronically Signed   By: Rise Mu M.D.   On: 11/23/2013 04:28   Ct Head Wo Contrast  11/23/2013   CLINICAL DATA:  Weakness  EXAM: CT HEAD WITHOUT CONTRAST  TECHNIQUE: Contiguous axial images were obtained from the base of the skull through the vertex without intravenous contrast.  COMPARISON:  10/16/2013  FINDINGS: The bony calvarium is  intact. No gross soft tissue abnormality is noted. Diffuse atrophic changes are seen is rales chronic white matter ischemic change. No findings to suggest acute hemorrhage, acute infarction or space-occupying mass lesion are noted.  IMPRESSION: Chronic changes similar to that seen on the prior exam. No acute abnormality is noted.   Electronically Signed   By: Alcide Clever M.D.   On: 11/23/2013 07:50    Medications: I have reviewed the patient's current medications.  Assessment/Plan: #1 near syncope: unclear cause and most likely from worsening aortic valve stenosis, and less likely from a transient arrhythmia or ischemia.  We will check cardiac isoenzymes today and an echocardiogram for further evaluation.  If her telemetry remains unremarkable without serious arrhythmia and she may be discharged as soon as this afternoon. #2 diabetes mellitus, type II: Well controlled on metformin  LOS: 1 day   Jadis Mika G 11/24/2013, 8:41 AM

## 2013-11-24 NOTE — Discharge Summary (Signed)
Physician Discharge Summary  Patient ID: [name] MRN: [id number] DOB/AGE: 78-Jun-1931 78 y.o.  Admit date: 11/23/2013 Discharge date: 11/24/2013   Discharge Diagnoses:  Principal Problem:   Altered mental status Active Problems:   AORTIC STENOSIS   SOB (shortness of breath)   Stasis leg ulcer   Type II or unspecified type diabetes mellitus without mention of complication, not stated as uncontrolled   Pacemaker-St.Jude   Edema   Shortness of breath   Discharged Condition: good  Hospital Course: [name] is a 80 year old African American female with history of CAD, moderate AS, and prior pacemaker placement who presented to the emergency department with complaint of altered mental status. The patient was asked to see in emergency department overnight for increased motion the swelling and drainage from her legs. Her family noted a significant increase in bilateral lower extremity edema stating that her legs had increased 1-1/2 times normal. This seems to develop in the last several days. Is associated with some weeping from her lower extremities. Her son brought her to the emergency room last night around 12:30 AM where she remained until this morning. Evaluation at that time showed mild pulmonary edema on chest x-ray. She did complain of some shortness of breath during her ER stay. She was noted to have a transient episode of decreased level of consciousness in the emergency department which occurred after phlebotomy. At that time she complained of shortness of breath and weakness. Blood sugar was 78. Head CT was obtained that showed no acute findings. Symptoms resolved within 5 minutes and she was back to baseline before discharge. She was discharged on oral Lasix. Shortly after arrival home she was noted to have another episode of decreased level of consciousness. The episode lasted about 20 minutes during which time she was minimally responsive. EMS was called and evaluated the  patient. Elevated her head and ultimately she became unresponsive. There is no documentation of her blood sugar at that time. She was brought to the emergency department where her vital signs been stable. Evaluation shows a significant increase in her BNP since this morning a chest x-ray shows no pulmonary edema. Her O2 sats are 99%. Given the repeated visits to the emergency department she is being admitted for further evaluation. Patient's history is limited by her dementia but she is currently without complaint.  She was admitted to a medical bed with telemetry.  Telemetry only showed that she was 100% with a paced cardiac rhythm.  She had serial cardiac isoenzymes from there were all normal.  She also had a transthoracic echocardiogram that showed no significant interval change from previous study and revealed left ventricular ejection fraction of about 50% with moderate left ventricular hypertrophy, moderate aortic valve stenosis with a peak transvalvular gradient of 41 mmHg.  She did well throughout the day with no change in her baseline mild dementia.  She was able to eat and drink meals without difficulty.  She had no competitions from her hospitalization.   Consults: None  Significant Diagnostic Studies:  Dg Chest 2 View  11/23/2013   CLINICAL DATA:  Unresponsive.  EXAM: CHEST  2 VIEW  COMPARISON:  DG CHEST 2 VIEW dated 11/23/2013  FINDINGS: Mediastinum and hilar structures normal. Borderline cardiomegaly. Pulmonary vascularity unremarkable. Cardiac pacer with lead tips in right atrium and right ventricle. No pleural effusion or pneumothorax. No focal pulmonary infiltrate. Poor inspiration. Degenerative changes thoracic spine and both shoulders.  IMPRESSION: 1. Mild cardiomegaly. Cardiac pacer noted. No evidence  of overt congestive heart failure. 2. No acute pulmonary infiltrates.   Electronically Signed   By: Maisie Fus  Register   On: 11/23/2013 17:37   Dg Chest 2 View  11/23/2013   CLINICAL DATA:   Leg swelling  EXAM: CHEST  2 VIEW  COMPARISON:  In prior radiograph from 07/01/2013  FINDINGS: Left-sided transvenous pacemaker/ AICD is unchanged. There is accentuation of the cardiac silhouette, likely related to shallow lung inflation an AP technique. Mediastinal silhouette within normal limits.  The lungs are hypoinflated. There is diffuse pulmonary vascular congestion with indistinctness of the interstitial markings, suggestive of mild edema. Blunting of the right costophrenic angle is suggestive of small right pleural effusion. No focal infiltrate. No pneumothorax.  Osseous structures are within normal limits.  IMPRESSION: 1. Diffuse pulmonary vascular congestion with indistinctness of the interstitial markings, suggestive of mild pulmonary edema. 2. Small right pleural effusion.   Electronically Signed   By: Rise Mu M.D.   On: 11/23/2013 04:28   Ct Head Wo Contrast  11/23/2013   CLINICAL DATA:  Weakness  EXAM: CT HEAD WITHOUT CONTRAST  TECHNIQUE: Contiguous axial images were obtained from the base of the skull through the vertex without intravenous contrast.  COMPARISON:  10/16/2013  FINDINGS: The bony calvarium is intact. No gross soft tissue abnormality is noted. Diffuse atrophic changes are seen is rales chronic white matter ischemic change. No findings to suggest acute hemorrhage, acute infarction or space-occupying mass lesion are noted.  IMPRESSION: Chronic changes similar to that seen on the prior exam. No acute abnormality is noted.   Electronically Signed   By: Alcide Clever M.D.   On: 11/23/2013 07:50    Labs: Lab Results  Component Value Date   WBC 4.0 11/24/2013   HGB 11.4* 11/24/2013   HCT 36.0 11/24/2013   MCV 81.4 11/24/2013   PLT 100* 11/24/2013     Recent Labs Lab 11/23/13 0645  11/24/13 0345  NA 143  < > 143  K 3.6*  < > 3.6*  CL 104  < > 103  CO2 27  < > 32  BUN 15  < > 11  CREATININE 0.93  < > 0.99  CALCIUM 9.8  < > 10.2  PROT 6.5  --   --   BILITOT 0.6   --   --   ALKPHOS 71  --   --   ALT 12  --   --   AST 24  --   --   GLUCOSE 83  < > 90  < > = values in this interval not displayed.     Lab Results  Component Value Date   INR 1.1 ratio* 01/06/2010   INR 1.12 12/15/2009     No results found for this or any previous visit (from the past 240 hour(s)).    Discharge Exam: Blood pressure 160/84, pulse 64, temperature 98.6 F (37 C), temperature source Oral, resp. rate 16, height 5\' 9"  (1.753 m), weight 91.6 kg (201 lb 15.1 oz), SpO2 100.00%.  Physical Exam: In general, the patient is a mildly overweight African-American woman who was in no apparent distress while lying supine.  HEENT exam was within normal limits, neck was supple without jugular venous distention or carotid bruit, chest was clear to auscultation, heart had a regular rate and rhythm with a systolic ejection murmur grade 3/6 at the left sternal border, abdomen had normal bowel sounds and no tenderness, she had bilateral 1+ leg edema with bilateral superficial venous stasis  ulcers with minimal drainage.  There is no signs of infection of the ulcers.  She is alert and answered questions appropriately, and she was able to move all extremities well.  Disposition:she will be discharged from the hospital in the company of family members.  Her medications have been changed with the addition of Lasix with potassium.  She will monitor her body weights at home closely and have a follow-up visit with us in about one week.  Discharge Orders   Future Appointments Provider Department Dept Phone   12/07/2013 2:00 PM [facility] [address] [phone]   Future Orders Complete By Expires   Call MD for:  As directed    Comments:     Call if you have problems with difficulty breathing, chest pain, dizziness, worsened leg swelling, or other concerning symptoms   Diet - low sodium heart healthy  As directed    Discharge instructions  As directed     Comments:     Check body weight every day and call us if weight increases or decreases by 3 pounds from your initial weight.   Increase activity slowly  As directed        Medication List         aspirin EC 81 MG tablet  Take 81 mg by mouth daily.     carvedilol 12.5 MG tablet  Commonly known as:  COREG  Take 12.5 mg by mouth 2 (two) times daily with a meal.     diclofenac sodium 1 % Gel  Commonly known as:  VOLTAREN  Apply 2 g topically 4 (four) times daily as needed (pain).     donepezil 10 MG disintegrating tablet  Commonly known as:  ARICEPT ODT  Take 10 mg by mouth at bedtime.     furosemide 20 MG tablet  Commonly known as:  LASIX  Take 0.5 tablets (10 mg total) by mouth daily.     gabapentin 300 MG capsule  Commonly known as:  NEURONTIN  Take 300 mg by mouth 2 (two) times daily.     nitroGLYCERIN 0.4 MG SL tablet  Commonly known as:  NITROSTAT  Place 0.4 mg under the tongue every 5 (five) minutes as needed for chest pain.     potassium chloride 10 MEQ tablet  Commonly known as:  K-DUR  Take 1 tablet (10 mEq total) by mouth daily.     rosuvastatin 10 MG tablet  Commonly known as:  CRESTOR  Take 1 tablet (10 mg total) by mouth daily.     traMADol 50 MG tablet  Commonly known as:  ULTRAM  Take 50 mg by mouth every 6 (six) hours as needed for moderate pain.           Follow-up Information   Follow up with Garlan FillersPATERSON,Tyjanae Bartek G, MD. Schedule an appointment as soon as possible for a visit in 1 week.   Specialty:  Internal Medicine   Contact information:   [address] [place] [id number] [phone]       Signed: Garlan FillersERSON,Jasha Hodzic G 11/24/2013, 5:17 PM

## 2013-11-27 LAB — GLUCOSE, CAPILLARY: Glucose-Capillary: 79 mg/dL (ref 70–99)

## 2013-12-07 ENCOUNTER — Other Ambulatory Visit (HOSPITAL_COMMUNITY): Payer: Medicare Other

## 2013-12-20 ENCOUNTER — Encounter: Payer: Self-pay | Admitting: *Deleted

## 2013-12-25 ENCOUNTER — Institutional Professional Consult (permissible substitution): Payer: Medicare Other | Admitting: Neurology

## 2014-01-01 ENCOUNTER — Institutional Professional Consult (permissible substitution): Payer: Medicare Other | Admitting: Neurology

## 2014-02-07 ENCOUNTER — Encounter: Payer: Self-pay | Admitting: *Deleted

## 2014-02-08 ENCOUNTER — Telehealth: Payer: Self-pay | Admitting: Cardiology

## 2014-02-08 NOTE — Telephone Encounter (Signed)
Certified letter sent 

## 2015-04-18 ENCOUNTER — Encounter (HOSPITAL_BASED_OUTPATIENT_CLINIC_OR_DEPARTMENT_OTHER): Payer: Medicare Other | Attending: Internal Medicine

## 2015-04-18 DIAGNOSIS — S81809A Unspecified open wound, unspecified lower leg, initial encounter: Secondary | ICD-10-CM | POA: Insufficient documentation

## 2015-04-18 DIAGNOSIS — D649 Anemia, unspecified: Secondary | ICD-10-CM | POA: Insufficient documentation

## 2015-04-18 DIAGNOSIS — I10 Essential (primary) hypertension: Secondary | ICD-10-CM | POA: Insufficient documentation

## 2015-04-18 DIAGNOSIS — G309 Alzheimer's disease, unspecified: Secondary | ICD-10-CM | POA: Insufficient documentation

## 2015-04-18 DIAGNOSIS — E119 Type 2 diabetes mellitus without complications: Secondary | ICD-10-CM | POA: Insufficient documentation

## 2015-04-18 DIAGNOSIS — X58XXXA Exposure to other specified factors, initial encounter: Secondary | ICD-10-CM | POA: Insufficient documentation

## 2015-04-18 DIAGNOSIS — F0280 Dementia in other diseases classified elsewhere without behavioral disturbance: Secondary | ICD-10-CM | POA: Insufficient documentation

## 2015-04-18 DIAGNOSIS — I2510 Atherosclerotic heart disease of native coronary artery without angina pectoris: Secondary | ICD-10-CM | POA: Insufficient documentation

## 2015-04-18 DIAGNOSIS — I87322 Chronic venous hypertension (idiopathic) with inflammation of left lower extremity: Secondary | ICD-10-CM | POA: Insufficient documentation

## 2015-04-18 DIAGNOSIS — I739 Peripheral vascular disease, unspecified: Secondary | ICD-10-CM | POA: Insufficient documentation

## 2015-04-30 DIAGNOSIS — F0280 Dementia in other diseases classified elsewhere without behavioral disturbance: Secondary | ICD-10-CM | POA: Diagnosis not present

## 2015-04-30 DIAGNOSIS — I87322 Chronic venous hypertension (idiopathic) with inflammation of left lower extremity: Secondary | ICD-10-CM | POA: Diagnosis not present

## 2015-04-30 DIAGNOSIS — E119 Type 2 diabetes mellitus without complications: Secondary | ICD-10-CM | POA: Diagnosis not present

## 2015-04-30 DIAGNOSIS — I2510 Atherosclerotic heart disease of native coronary artery without angina pectoris: Secondary | ICD-10-CM | POA: Diagnosis not present

## 2015-04-30 DIAGNOSIS — G309 Alzheimer's disease, unspecified: Secondary | ICD-10-CM | POA: Diagnosis not present

## 2015-04-30 DIAGNOSIS — X58XXXA Exposure to other specified factors, initial encounter: Secondary | ICD-10-CM | POA: Diagnosis not present

## 2015-04-30 DIAGNOSIS — I739 Peripheral vascular disease, unspecified: Secondary | ICD-10-CM | POA: Diagnosis not present

## 2015-04-30 DIAGNOSIS — S81809A Unspecified open wound, unspecified lower leg, initial encounter: Secondary | ICD-10-CM | POA: Diagnosis not present

## 2015-04-30 DIAGNOSIS — I10 Essential (primary) hypertension: Secondary | ICD-10-CM | POA: Diagnosis not present

## 2015-04-30 DIAGNOSIS — D649 Anemia, unspecified: Secondary | ICD-10-CM | POA: Diagnosis not present

## 2015-05-09 ENCOUNTER — Encounter (HOSPITAL_BASED_OUTPATIENT_CLINIC_OR_DEPARTMENT_OTHER): Payer: Medicare Other | Attending: Internal Medicine

## 2015-05-09 DIAGNOSIS — D649 Anemia, unspecified: Secondary | ICD-10-CM | POA: Insufficient documentation

## 2015-05-09 DIAGNOSIS — S81802D Unspecified open wound, left lower leg, subsequent encounter: Secondary | ICD-10-CM | POA: Diagnosis not present

## 2015-05-09 DIAGNOSIS — I739 Peripheral vascular disease, unspecified: Secondary | ICD-10-CM | POA: Diagnosis not present

## 2015-05-09 DIAGNOSIS — E119 Type 2 diabetes mellitus without complications: Secondary | ICD-10-CM | POA: Insufficient documentation

## 2015-05-09 DIAGNOSIS — I87322 Chronic venous hypertension (idiopathic) with inflammation of left lower extremity: Secondary | ICD-10-CM | POA: Diagnosis not present

## 2015-05-09 DIAGNOSIS — I2510 Atherosclerotic heart disease of native coronary artery without angina pectoris: Secondary | ICD-10-CM | POA: Diagnosis not present

## 2015-05-09 DIAGNOSIS — X58XXXD Exposure to other specified factors, subsequent encounter: Secondary | ICD-10-CM | POA: Insufficient documentation

## 2015-05-09 DIAGNOSIS — I10 Essential (primary) hypertension: Secondary | ICD-10-CM | POA: Diagnosis not present

## 2015-05-09 DIAGNOSIS — G309 Alzheimer's disease, unspecified: Secondary | ICD-10-CM | POA: Diagnosis not present

## 2015-05-17 ENCOUNTER — Inpatient Hospital Stay (HOSPITAL_COMMUNITY)
Admission: EM | Admit: 2015-05-17 | Discharge: 2015-05-24 | DRG: 193 | Disposition: A | Discharge status: Skilled Nursing Facility | Payer: Medicare Other | Attending: Internal Medicine | Admitting: Internal Medicine

## 2015-05-17 ENCOUNTER — Encounter (HOSPITAL_COMMUNITY): Payer: Self-pay

## 2015-05-17 ENCOUNTER — Emergency Department (HOSPITAL_COMMUNITY): Payer: Medicare Other

## 2015-05-17 DIAGNOSIS — N289 Disorder of kidney and ureter, unspecified: Secondary | ICD-10-CM

## 2015-05-17 DIAGNOSIS — R531 Weakness: Secondary | ICD-10-CM | POA: Diagnosis not present

## 2015-05-17 DIAGNOSIS — E860 Dehydration: Secondary | ICD-10-CM | POA: Diagnosis present

## 2015-05-17 DIAGNOSIS — I2510 Atherosclerotic heart disease of native coronary artery without angina pectoris: Secondary | ICD-10-CM | POA: Diagnosis present

## 2015-05-17 DIAGNOSIS — E861 Hypovolemia: Secondary | ICD-10-CM | POA: Diagnosis present

## 2015-05-17 DIAGNOSIS — Z8249 Family history of ischemic heart disease and other diseases of the circulatory system: Secondary | ICD-10-CM | POA: Diagnosis not present

## 2015-05-17 DIAGNOSIS — Z79899 Other long term (current) drug therapy: Secondary | ICD-10-CM | POA: Diagnosis not present

## 2015-05-17 DIAGNOSIS — Z950 Presence of cardiac pacemaker: Secondary | ICD-10-CM

## 2015-05-17 DIAGNOSIS — N179 Acute kidney failure, unspecified: Secondary | ICD-10-CM | POA: Diagnosis present

## 2015-05-17 DIAGNOSIS — I340 Nonrheumatic mitral (valve) insufficiency: Secondary | ICD-10-CM | POA: Diagnosis present

## 2015-05-17 DIAGNOSIS — J690 Pneumonitis due to inhalation of food and vomit: Secondary | ICD-10-CM | POA: Diagnosis not present

## 2015-05-17 DIAGNOSIS — R05 Cough: Secondary | ICD-10-CM

## 2015-05-17 DIAGNOSIS — Z87891 Personal history of nicotine dependence: Secondary | ICD-10-CM | POA: Diagnosis not present

## 2015-05-17 DIAGNOSIS — Z6838 Body mass index (BMI) 38.0-38.9, adult: Secondary | ICD-10-CM | POA: Diagnosis not present

## 2015-05-17 DIAGNOSIS — F0280 Dementia in other diseases classified elsewhere without behavioral disturbance: Secondary | ICD-10-CM | POA: Diagnosis present

## 2015-05-17 DIAGNOSIS — F0390 Unspecified dementia without behavioral disturbance: Secondary | ICD-10-CM | POA: Diagnosis not present

## 2015-05-17 DIAGNOSIS — R627 Adult failure to thrive: Secondary | ICD-10-CM | POA: Diagnosis present

## 2015-05-17 DIAGNOSIS — Z800 Family history of malignant neoplasm of digestive organs: Secondary | ICD-10-CM

## 2015-05-17 DIAGNOSIS — G309 Alzheimer's disease, unspecified: Secondary | ICD-10-CM | POA: Diagnosis present

## 2015-05-17 DIAGNOSIS — I252 Old myocardial infarction: Secondary | ICD-10-CM | POA: Diagnosis not present

## 2015-05-17 DIAGNOSIS — R059 Cough, unspecified: Secondary | ICD-10-CM

## 2015-05-17 DIAGNOSIS — R32 Unspecified urinary incontinence: Secondary | ICD-10-CM | POA: Diagnosis present

## 2015-05-17 DIAGNOSIS — I5031 Acute diastolic (congestive) heart failure: Secondary | ICD-10-CM | POA: Diagnosis not present

## 2015-05-17 DIAGNOSIS — E669 Obesity, unspecified: Secondary | ICD-10-CM | POA: Diagnosis present

## 2015-05-17 DIAGNOSIS — Z7982 Long term (current) use of aspirin: Secondary | ICD-10-CM | POA: Diagnosis not present

## 2015-05-17 DIAGNOSIS — I350 Nonrheumatic aortic (valve) stenosis: Secondary | ICD-10-CM | POA: Diagnosis present

## 2015-05-17 DIAGNOSIS — K219 Gastro-esophageal reflux disease without esophagitis: Secondary | ICD-10-CM | POA: Diagnosis present

## 2015-05-17 DIAGNOSIS — E876 Hypokalemia: Secondary | ICD-10-CM | POA: Diagnosis present

## 2015-05-17 DIAGNOSIS — N182 Chronic kidney disease, stage 2 (mild): Secondary | ICD-10-CM | POA: Diagnosis present

## 2015-05-17 DIAGNOSIS — Z91040 Latex allergy status: Secondary | ICD-10-CM

## 2015-05-17 DIAGNOSIS — Z833 Family history of diabetes mellitus: Secondary | ICD-10-CM

## 2015-05-17 DIAGNOSIS — J189 Pneumonia, unspecified organism: Secondary | ICD-10-CM | POA: Diagnosis present

## 2015-05-17 DIAGNOSIS — N390 Urinary tract infection, site not specified: Secondary | ICD-10-CM | POA: Diagnosis present

## 2015-05-17 DIAGNOSIS — R7989 Other specified abnormal findings of blood chemistry: Secondary | ICD-10-CM

## 2015-05-17 DIAGNOSIS — E119 Type 2 diabetes mellitus without complications: Secondary | ICD-10-CM | POA: Diagnosis present

## 2015-05-17 DIAGNOSIS — I10 Essential (primary) hypertension: Secondary | ICD-10-CM | POA: Diagnosis present

## 2015-05-17 DIAGNOSIS — E785 Hyperlipidemia, unspecified: Secondary | ICD-10-CM | POA: Diagnosis present

## 2015-05-17 DIAGNOSIS — R1314 Dysphagia, pharyngoesophageal phase: Secondary | ICD-10-CM | POA: Diagnosis present

## 2015-05-17 DIAGNOSIS — Z8673 Personal history of transient ischemic attack (TIA), and cerebral infarction without residual deficits: Secondary | ICD-10-CM

## 2015-05-17 DIAGNOSIS — R778 Other specified abnormalities of plasma proteins: Secondary | ICD-10-CM

## 2015-05-17 DIAGNOSIS — J9811 Atelectasis: Secondary | ICD-10-CM | POA: Diagnosis present

## 2015-05-17 DIAGNOSIS — I080 Rheumatic disorders of both mitral and aortic valves: Secondary | ICD-10-CM | POA: Diagnosis present

## 2015-05-17 DIAGNOSIS — I129 Hypertensive chronic kidney disease with stage 1 through stage 4 chronic kidney disease, or unspecified chronic kidney disease: Secondary | ICD-10-CM | POA: Diagnosis present

## 2015-05-17 DIAGNOSIS — R1312 Dysphagia, oropharyngeal phase: Secondary | ICD-10-CM | POA: Diagnosis not present

## 2015-05-17 LAB — LACTIC ACID, PLASMA
Lactic Acid, Venous: 2.3 mmol/L (ref 0.5–2.0)
Lactic Acid, Venous: 2.1 mmol/L (ref 0.5–2.0)

## 2015-05-17 LAB — URINALYSIS, ROUTINE W REFLEX MICROSCOPIC
Glucose, UA: NEGATIVE mg/dL
Ketones, ur: NEGATIVE mg/dL
Nitrite: POSITIVE — AB
PH: 5.5 (ref 5.0–8.0)
Protein, ur: 100 mg/dL — AB
Specific Gravity, Urine: 1.03 (ref 1.005–1.030)
UROBILINOGEN UA: 1 mg/dL (ref 0.0–1.0)

## 2015-05-17 LAB — COMPREHENSIVE METABOLIC PANEL
ALBUMIN: 3.4 g/dL — AB (ref 3.5–5.0)
ALK PHOS: 63 U/L (ref 38–126)
ALT: 15 U/L (ref 14–54)
ANION GAP: 11 (ref 5–15)
AST: 36 U/L (ref 15–41)
BUN: 30 mg/dL — AB (ref 6–20)
CALCIUM: 10.4 mg/dL — AB (ref 8.9–10.3)
CO2: 22 mmol/L (ref 22–32)
Chloride: 105 mmol/L (ref 101–111)
Creatinine, Ser: 1.6 mg/dL — ABNORMAL HIGH (ref 0.44–1.00)
GFR calc Af Amer: 33 mL/min — ABNORMAL LOW (ref >60)
GFR calc non Af Amer: 29 mL/min — ABNORMAL LOW (ref >60)
GLUCOSE: 137 mg/dL — AB (ref 65–99)
Potassium: 3.8 mmol/L (ref 3.5–5.1)
SODIUM: 138 mmol/L (ref 135–145)
Total Bilirubin: 2.1 mg/dL — ABNORMAL HIGH (ref 0.3–1.2)
Total Protein: 7.8 g/dL (ref 6.5–8.1)

## 2015-05-17 LAB — CBC WITH DIFFERENTIAL/PLATELET
Basophils Absolute: 0 10*3/uL (ref 0.0–0.1)
Basophils Relative: 0 %
Eosinophils Absolute: 0 10*3/uL (ref 0.0–0.7)
Eosinophils Relative: 0 %
HEMATOCRIT: 40.1 % (ref 36.0–46.0)
Hemoglobin: 12.6 g/dL (ref 12.0–15.0)
LYMPHS ABS: 1.3 10*3/uL (ref 0.7–4.0)
LYMPHS PCT: 13 %
MCH: 26.4 pg (ref 26.0–34.0)
MCHC: 31.4 g/dL (ref 30.0–36.0)
MCV: 84.1 fL (ref 78.0–100.0)
Monocytes Absolute: 0.5 10*3/uL (ref 0.1–1.0)
Monocytes Relative: 6 %
NEUTROS ABS: 7.8 10*3/uL — AB (ref 1.7–7.7)
Neutrophils Relative %: 81 %
Platelets: 135 10*3/uL — ABNORMAL LOW (ref 150–400)
RBC: 4.77 MIL/uL (ref 3.87–5.11)
RDW: 14.6 % (ref 11.5–15.5)
WBC: 9.7 10*3/uL (ref 4.0–10.5)

## 2015-05-17 LAB — URINE MICROSCOPIC-ADD ON

## 2015-05-17 LAB — TROPONIN I: Troponin I: 0.08 ng/mL — ABNORMAL HIGH (ref <0.031)

## 2015-05-17 MED ORDER — DEXTROSE 5 % IV SOLN
500.0000 mg | INTRAVENOUS | Status: DC
  Administered 2015-05-18: 500 mg via INTRAVENOUS
  Filled 2015-05-17: qty 500

## 2015-05-17 MED ORDER — DONEPEZIL HCL 10 MG PO TABS
10.0000 mg | ORAL_TABLET | Freq: Every day | ORAL | Status: DC
  Administered 2015-05-17 – 2015-05-23 (×7): 10 mg via ORAL
  Filled 2015-05-17 (×8): qty 1

## 2015-05-17 MED ORDER — ACETAMINOPHEN 650 MG RE SUPP: 650.0000 mg | Freq: Four times a day (QID) | RECTAL | Status: DC | PRN

## 2015-05-17 MED ORDER — ASPIRIN EC 81 MG PO TBEC
81.0000 mg | DELAYED_RELEASE_TABLET | Freq: Every day | ORAL | Status: DC
  Administered 2015-05-17 – 2015-05-24 (×8): 81 mg via ORAL
  Filled 2015-05-17 (×8): qty 1

## 2015-05-17 MED ORDER — HEPARIN SODIUM (PORCINE) 5000 UNIT/ML IJ SOLN
5000.0000 [IU] | Freq: Three times a day (TID) | INTRAMUSCULAR | Status: DC
  Administered 2015-05-17 – 2015-05-24 (×20): 5000 [IU] via SUBCUTANEOUS
  Filled 2015-05-17 (×23): qty 1

## 2015-05-17 MED ORDER — SODIUM CHLORIDE 0.9 % IV SOLN
INTRAVENOUS | Status: DC
  Administered 2015-05-17 – 2015-05-18 (×2): via INTRAVENOUS

## 2015-05-17 MED ORDER — DONEPEZIL HCL 10 MG PO TBDP: 10.0000 mg | ORAL_TABLET | Freq: Every day | ORAL | Status: DC

## 2015-05-17 MED ORDER — LEVOFLOXACIN IN D5W 750 MG/150ML IV SOLN
750.0000 mg | Freq: Once | INTRAVENOUS | Status: AC
  Administered 2015-05-17: 750 mg via INTRAVENOUS

## 2015-05-17 MED ORDER — SODIUM CHLORIDE 0.9 % IV BOLUS (SEPSIS)
250.0000 mL | Freq: Once | INTRAVENOUS | Status: AC
  Administered 2015-05-17: 250 mL via INTRAVENOUS

## 2015-05-17 MED ORDER — ALUM & MAG HYDROXIDE-SIMETH 200-200-20 MG/5ML PO SUSP: 30.0000 mL | Freq: Four times a day (QID) | ORAL | Status: DC | PRN

## 2015-05-17 MED ORDER — ACETAMINOPHEN 325 MG PO TABS
650.0000 mg | ORAL_TABLET | Freq: Four times a day (QID) | ORAL | Status: DC | PRN
  Administered 2015-05-22 – 2015-05-23 (×2): 650 mg via ORAL
  Filled 2015-05-17 (×2): qty 2

## 2015-05-17 MED ORDER — ONDANSETRON HCL 4 MG/2ML IJ SOLN: 4.0000 mg | Freq: Four times a day (QID) | INTRAMUSCULAR | Status: DC | PRN

## 2015-05-17 MED ORDER — ONDANSETRON HCL 4 MG PO TABS: 4.0000 mg | ORAL_TABLET | Freq: Four times a day (QID) | ORAL | Status: DC | PRN

## 2015-05-17 MED ORDER — ROSUVASTATIN CALCIUM 10 MG PO TABS
10.0000 mg | ORAL_TABLET | Freq: Every day | ORAL | Status: DC
  Administered 2015-05-17 – 2015-05-24 (×8): 10 mg via ORAL
  Filled 2015-05-17 (×8): qty 1

## 2015-05-17 MED ORDER — SENNOSIDES-DOCUSATE SODIUM 8.6-50 MG PO TABS: 1.0000 | ORAL_TABLET | Freq: Every evening | ORAL | Status: DC | PRN

## 2015-05-17 MED ORDER — DEXTROSE 5 % IV SOLN
1.0000 g | INTRAVENOUS | Status: DC
  Administered 2015-05-18 – 2015-05-23 (×6): 1 g via INTRAVENOUS
  Filled 2015-05-17 (×7): qty 10

## 2015-05-17 MED ORDER — SODIUM CHLORIDE 0.9 % IV SOLN
INTRAVENOUS | Status: DC
  Administered 2015-05-17: 17:00:00 via INTRAVENOUS

## 2015-05-17 NOTE — ED Notes (Signed)
rn drawing lab

## 2015-05-17 NOTE — ED Notes (Signed)
Pt transported by [facility] from home.  Family reports patient has had decreased PO intake over the last few days and family wants patient assessed for dehydration.  Family reports she has been off lasix for the past two days. Pt alert and disoriented.

## 2015-05-17 NOTE — Progress Notes (Signed)
Received phone call from day shift EDCM reporting Tim from [facility] called to confirm patient is active with [place] for RN services.

## 2015-05-17 NOTE — ED Provider Notes (Signed)
CSN: [id number]     Arrival date & time 05/17/15  1224 History   First MD Initiated Contact with Patient 05/17/15 1232     No chief complaint on file.     The history is provided by the patient, a caregiver and the EMS personnel. The history is limited by the condition of the patient (Hx dementia).  Pt was seen at 1235. Per EMS and pt's family: Pt's family states pt has had increasing generalized weakness for the past 2 to 3 days. Pt's family states pt is unable to stand or even sit up due to her weakness. Pt has not taken PO for the past 2 to 3 days. Family is concerned regarding "dehydration." Denies N/V/D, no fevers. Pt herself has hx of dementia and currently denies any complaints.    Past Medical History  Diagnosis Date  . DM (diabetes mellitus)   . Hyperlipidemia   . HTN (hypertension)   . CAD (coronary artery disease)     s/p NSTEMI 2003 with stent placement OM1.  PTCA with cutting balloon prox LAD. 2005 admission with chest pain, BMS placed to CFX and DES placed in RCA  . Aortic stenosis     moderate to severe  . Anemia   . CRI (chronic renal insufficiency)   . CVA (cerebral infarction)     remote; recovered  . Second degree Mobitz II AV block     s/p PPM 12/2009  . Hx of colonoscopy   . Headache 2003  . GERD (gastroesophageal reflux disease)   . Alzheimer's disease 07/2011   Past Surgical History  Procedure Laterality Date  . Hysterectomy    . Benign vocal cord tumor removed    . Right knee surgery      x 3 with TKA  . Pacemaker insertion  01/10/2010    SJM Accent DR RF implanted by Dr Johney Frame   Family History  Problem Relation Age of Onset  . Coronary artery disease    . Hypertension    . Diabetes Mother   . Diabetes Father   . Heart disease Mother   . Colon cancer Neg Hx   . Stomach cancer Sister    Social History  Substance Use Topics  . Smoking status: Former Smoker -- 0.50 packs/day for 30 years    Quit date: 08/31/1990  . Smokeless tobacco: Never Used   . Alcohol Use: No    Review of Systems  Unable to perform ROS: Dementia      Allergies  Latex  Home Medications   Prior to Admission medications   Medication Sig Start Date End Date Taking? Authorizing Provider  aspirin EC 81 MG tablet Take 81 mg by mouth daily.   Yes Historical Provider, MD  diclofenac sodium (VOLTAREN) 1 % GEL Apply 2 g topically 4 (four) times daily as needed (pain).   Yes Historical Provider, MD  donepezil (ARICEPT ODT) 10 MG disintegrating tablet Take 10 mg by mouth at bedtime.   Yes Historical Provider, MD  furosemide (LASIX) 20 MG tablet Take 0.5 tablets (10 mg total) by mouth daily. 11/23/13  Yes Gwyneth Sprout, MD  metoprolol succinate (TOPROL-XL) 50 MG 24 hr tablet Take 50 mg by mouth daily. Take with or immediately following a meal.   Yes Historical Provider, MD  nitroGLYCERIN (NITROSTAT) 0.4 MG SL tablet Place 0.4 mg under the tongue every 5 (five) minutes as needed for chest pain.    Yes Historical Provider, MD  potassium chloride (K-DUR) 10 MEQ tablet  Take 1 tablet (10 mEq total) by mouth daily. 11/24/13  Yes Jarome Matin, MD  rosuvastatin (CRESTOR) 10 MG tablet Take 1 tablet (10 mg total) by mouth daily. 08/18/13  Yes Kathleene Hazel, MD   BP 99/56 mmHg  Pulse 106  Temp(Src) 99.2 F (37.3 C) (Oral)  Resp 20  SpO2 96%  Filed Vitals:   05/17/15 1235 05/17/15 1506  BP: 99/56 114/51  Pulse: 106 71  Temp: 99.2 F (37.3 C)   TempSrc: Oral   Resp: 20 20  SpO2: 96% 97%    Physical Exam  1240: Physical examination:  Nursing notes reviewed; Vital signs and O2 SAT reviewed;  Constitutional: Well developed, Well nourished, In no acute distress; Head:  Normocephalic, atraumatic; Eyes: EOMI, PERRL, No scleral icterus; ENMT: Mouth and pharynx normal, Mucous membranes dry; Neck: Supple, Full range of motion, No lymphadenopathy; Cardiovascular: Regular rate and rhythm, No gallop; Respiratory: Breath sounds clear & equal bilaterally, No wheezes.   Speaking full sentences with ease, Normal respiratory effort/excursion; Chest: Nontender, Movement normal; Abdomen: Soft, Nontender, Nondistended, Normal bowel sounds; Genitourinary: No CVA tenderness; Extremities: Pulses normal, No tenderness, No edema, No calf edema or asymmetry.; Neuro: Awake, alert, confused per hx dementia. No facial droop. Speech clear. Grips equal. Strength 4/5 bilat UE's and LE's.; Skin: Color normal, Warm, Dry.   ED Course  Procedures (including critical care time) Labs Review   Imaging Review  I have personally reviewed and evaluated these images and lab results as part of my medical decision-making.   EKG Interpretation   Date/Time:  Friday May 17 2015 14:56:24 EDT Ventricular Rate:  90 PR Interval:  258 QRS Duration: 105 QT Interval:  400 QTC Calculation: 489 R Axis:   2 Text Interpretation:  Sinus rhythm Prolonged PR interval Abnormal R-wave  progression, early transition LVH with secondary repolarization  abnormality Nonspecific ST and T wave abnormality Diffuse When compared  with ECG of 11/23/2013 AV dual-paced rhythm is no longer Present  Nonspecific ST and T wave abnormality is now present Confirmed by [facility]   MD, Nicholos Johns [phone]) on 05/17/2015 3:15:20 PM      MDM  MDM Reviewed: previous chart, nursing note and vitals Reviewed previous: labs and ECG Interpretation: labs, ECG and x-ray      Results for orders placed or performed during the hospital encounter of 05/17/15  Comprehensive metabolic panel  Result Value Ref Range   Sodium 138 135 - 145 mmol/L   Potassium 3.8 3.5 - 5.1 mmol/L   Chloride 105 101 - 111 mmol/L   CO2 22 22 - 32 mmol/L   Glucose, Bld 137 (H) 65 - 99 mg/dL   BUN 30 (H) 6 - 20 mg/dL   Creatinine, Ser 6.04 (H) 0.44 - 1.00 mg/dL   Calcium 54.0 (H) 8.9 - 10.3 mg/dL   Total Protein 7.8 6.5 - 8.1 g/dL   Albumin 3.4 (L) 3.5 - 5.0 g/dL   AST 36 15 - 41 U/L   ALT 15 14 - 54 U/L   Alkaline Phosphatase 63 38 -  126 U/L   Total Bilirubin 2.1 (H) 0.3 - 1.2 mg/dL   GFR calc non Af Amer 29 (L) >60 mL/min   GFR calc Af Amer 33 (L) >60 mL/min   Anion gap 11 5 - 15  Troponin I  Result Value Ref Range   Troponin I 0.08 (H) <0.031 ng/mL  Lactic acid, plasma  Result Value Ref Range   Lactic Acid, Venous 2.3 (HH) 0.5 - 2.0  mmol/L  CBC with Differential  Result Value Ref Range   WBC 9.7 4.0 - 10.5 K/uL   RBC 4.77 3.87 - 5.11 MIL/uL   Hemoglobin 12.6 12.0 - 15.0 g/dL   HCT 16.1 09.6 - 04.5 %   MCV 84.1 78.0 - 100.0 fL   MCH 26.4 26.0 - 34.0 pg   MCHC 31.4 30.0 - 36.0 g/dL   RDW 40.9 81.1 - 91.4 %   Platelets 135 (L) 150 - 400 K/uL   Neutrophils Relative % 81 %   Neutro Abs 7.8 (H) 1.7 - 7.7 K/uL   Lymphocytes Relative 13 %   Lymphs Abs 1.3 0.7 - 4.0 K/uL   Monocytes Relative 6 %   Monocytes Absolute 0.5 0.1 - 1.0 K/uL   Eosinophils Relative 0 %   Eosinophils Absolute 0.0 0.0 - 0.7 K/uL   Basophils Relative 0 %   Basophils Absolute 0.0 0.0 - 0.1 K/uL   Dg Chest 2 View 05/17/2015   CLINICAL DATA:  Decreased PO intake over the past few days. Hypertension.  EXAM: CHEST  2 VIEW  COMPARISON:  11/23/2013  FINDINGS: Left-sided pacemaker unchanged. Lungs are hypoinflated with subtle right base opacification which may be due to atelectasis or infection. Mild stable cardiomegaly. Remainder the exam is unchanged.  IMPRESSION: Subtle right base opacification which may be due to atelectasis or infection.  Stable mild cardiomegaly.   Electronically Signed   By: Elberta Fortis M.D.   On: 05/17/2015 14:15   Results for WANEDA, KLAMMER (MRN [id number]) as of 05/17/2015 14:59  Ref. Range 11/23/2013 16:42 11/24/2013 03:45 05/17/2015 13:06  BUN Latest Ref Range: 6-20 mg/dL 13 11 30  (H)  Creatinine Latest Ref Range: 0.44-1.00 mg/dL 0.86 5.78 4.69 (H)    6295:  Judicious IVF given for newly elevated BUN/Cr, mild hypotension and elevated lactic acid. IV abx started for CAP. T/C to [facility]. Vanessa Barbara, case discussed,  including:  HPI, pertinent PM/SHx, VS/PE, dx testing, ED course and treatment:  Agreeable to admit, requests to write temporary orders, obtain medical bed to team [facility]mits.   Samuel Jester, DO 05/18/15 2016

## 2015-05-17 NOTE — H&P (Signed)
[facility] History and Physical  [name] ZOX:[id number] DOB: 1929-10-28 DOA: 05/17/2015  Referring physician:  PCP: Garlan Fillers, MD   Chief Complaint: Generalized Weakness  HPI: [name] is a 81 y.o. female with a past medical history of cognitive impairment, type 2 diabetes, hypertension, dyslipidemia, coronary artery disease, aortic stenosis presently residing in the community brought to the emergency department by her family members for worsening generalized weakness. History was obtained from her daughter who was present at bedside. She has had a significant functional decline since last Wednesday, having generalized weakness, fatigue, excessive daytime sleepiness, minimal by mouth intake, becoming more dependent on her activities of daily living. Family members have also noticed increased cough with associated sputum production or fevers, chills. Lab work performed in the emergency department revealed the presence of acute kidney injury with creatinine of 1.6. Patient is pleasantly confused, cannot provide accurate history.                                                                                                                                                                                                                                   Review of Systems:  Given underlying cognitive impairment and functional decline, she is unable to provide accurate history  Past Medical History  Diagnosis Date  . DM (diabetes mellitus)   . Hyperlipidemia   . HTN (hypertension)   . CAD (coronary artery disease)     s/p NSTEMI 2003 with stent placement OM1.  PTCA with cutting balloon prox LAD. 2005 admission with chest pain, BMS placed to CFX and DES placed in RCA  . Aortic stenosis     moderate to severe  . Anemia   . CRI (chronic renal insufficiency)   . CVA (cerebral infarction)     remote; recovered  . Second degree Mobitz II AV block     s/p PPM 12/2009  . Hx  of colonoscopy   . Headache 2003  . GERD (gastroesophageal reflux disease)   . Alzheimer's disease 07/2011   Past Surgical History  Procedure Laterality Date  . Hysterectomy    . Benign vocal cord tumor removed    . Right knee surgery      x 3 with TKA  . Pacemaker insertion  01/10/2010    SJM Accent DR RF implanted by Dr Johney Frame   Social History:  reports that she quit smoking about 24 years ago. She has  never used smokeless tobacco. She reports that she does not drink alcohol or use illicit drugs.  Allergies  Allergen Reactions  . Latex Rash    Family History  Problem Relation Age of Onset  . Coronary artery disease    . Hypertension    . Diabetes Mother   . Diabetes Father   . Heart disease Mother   . Colon cancer Neg Hx   . Stomach cancer Sister     Prior to Admission medications   Medication Sig Start Date End Date Taking? Authorizing Provider  aspirin EC 81 MG tablet Take 81 mg by mouth daily.   Yes Historical Provider, MD  diclofenac sodium (VOLTAREN) 1 % GEL Apply 2 g topically 4 (four) times daily as needed (pain).   Yes Historical Provider, MD  donepezil (ARICEPT ODT) 10 MG disintegrating tablet Take 10 mg by mouth at bedtime.   Yes Historical Provider, MD  furosemide (LASIX) 20 MG tablet Take 0.5 tablets (10 mg total) by mouth daily. 11/23/13  Yes Gwyneth Sprout, MD  metoprolol succinate (TOPROL-XL) 50 MG 24 hr tablet Take 50 mg by mouth daily. Take with or immediately following a meal.   Yes Historical Provider, MD  nitroGLYCERIN (NITROSTAT) 0.4 MG SL tablet Place 0.4 mg under the tongue every 5 (five) minutes as needed for chest pain.    Yes Historical Provider, MD  potassium chloride (K-DUR) 10 MEQ tablet Take 1 tablet (10 mEq total) by mouth daily. 11/24/13  Yes Jarome Matin, MD  rosuvastatin (CRESTOR) 10 MG tablet Take 1 tablet (10 mg total) by mouth daily. 08/18/13  Yes Kathleene Hazel, MD   Physical Exam: Filed Vitals:   05/17/15 1235 05/17/15  1506  BP: 99/56 114/51  Pulse: 106 71  Temp: 99.2 F (37.3 C)   TempSrc: Oral   Resp: 20 20  SpO2: 96% 97%    Wt Readings from Last 3 Encounters:  11/24/13 91.6 kg (201 lb 15.1 oz)  11/23/13 95.255 kg (210 lb)  10/30/13 95.165 kg (209 lb 12.8 oz)    General:  Patient is awake, confused, disoriented, pleasant and following commands. Eyes: PERRL, normal lids, irises & conjunctiva ENT: grossly normal hearing, lips & tongue, dry oral mucosa Neck: no LAD, masses or thyromegaly Cardiovascular: RRR, no m/r/g. No LE edema. Telemetry: SR, no arrhythmias  Respiratory: CTA bilaterally, no w/r/r. Normal respiratory effort. Abdomen: Obese, soft, ntnd Skin: no rash or induration seen on limited exam Musculoskeletal: She has bilateral lower extremity wraps Psychiatric: grossly normal mood and affect, speech fluent and appropriate, she is pleasantly confused and disoriented Neurologic: grossly non-focal.          Labs on Admission:  Basic Metabolic Panel:  Recent Labs Lab 05/17/15 1306  NA 138  K 3.8  CL 105  CO2 22  GLUCOSE 137*  BUN 30*  CREATININE 1.60*  CALCIUM 10.4*   Liver Function Tests:  Recent Labs Lab 05/17/15 1306  AST 36  ALT 15  ALKPHOS 63  BILITOT 2.1*  PROT 7.8  ALBUMIN 3.4*   No results for input(s): LIPASE, AMYLASE in the last 168 hours. No results for input(s): AMMONIA in the last 168 hours. CBC:  Recent Labs Lab 05/17/15 1306  WBC 9.7  NEUTROABS 7.8*  HGB 12.6  HCT 40.1  MCV 84.1  PLT 135*   Cardiac Enzymes:  Recent Labs Lab 05/17/15 1306  TROPONINI 0.08*    BNP (last 3 results) No results for input(s): BNP in the last 8760 hours.  ProBNP (last 3 results) No results for input(s): PROBNP in the last 8760 hours.  CBG: No results for input(s): GLUCAP in the last 168 hours.  Radiological Exams on Admission: Dg Chest 2 View  05/17/2015   CLINICAL DATA:  Decreased PO intake over the past few days. Hypertension.  EXAM: CHEST  2  VIEW  COMPARISON:  11/23/2013  FINDINGS: Left-sided pacemaker unchanged. Lungs are hypoinflated with subtle right base opacification which may be due to atelectasis or infection. Mild stable cardiomegaly. Remainder the exam is unchanged.  IMPRESSION: Subtle right base opacification which may be due to atelectasis or infection.  Stable mild cardiomegaly.   Electronically Signed   By: Elberta Fortis M.D.   On: 05/17/2015 14:15    EKG: Independently reviewed.   Assessment/Plan Principal Problem:   FTT (failure to thrive) in adult Active Problems:   CAP (community acquired pneumonia)   AKI (acute kidney injury)   MITRAL INSUFFICIENCY   Essential hypertension, benign   1. Failure to thrive/functional decline. Mrs Cosey is a pleasant 81 year old female with a history of cognitive impairment, presenting with functional decline over the past 3 days. Family members reporting patient having greater dependent on activities of daily living along with minimal by mouth intake and less interactive. At baseline she is able to ambulate with a cane. I suspect functional decline secondary to combination of dehydration and an underlying infectious process. Will provide gentle IV fluid hydration with normal saline running at 75 mL/hour and empiric IV antimicrobial therapy with ceftriaxone and azithromycin. 2. Possible community acquired pneumonia. Patient presenting with a functional decline. Family members noted increased cough over the past day. Suspect community-acquired pneumonia. Chest x-ray did not show an obvious infiltrate. Will treat with empiric IV antibiotic therapy with ceftriaxone and azithromycin, follow-up on blood cultures. Plan to repeat chest x-ray in a.m. 3. Acute kidney injury. Likely secondary to hypovolemia in context of functional decline and having minimal by mouth intake. She was also on diuretic therapy with Lasix which likely worsened dehydration. Will discontinue Lasix, provide gentle IV  fluid hydration with normal saline running at75 mL/hour.  4. History of hypertension. Patient initially presented with hypotension having blood pressure 99/56 likely related to hypovolemia. Will hold her metoprolol therapy for now. 5. History of moderate aortic stenosis. Her last transthoracic echocardiogram was performed on 11/24/2013 that showed moderate aortic stenosis. Ejection fraction was 50%. He does not have clinical evidence of acute decompensated heart failure. Will be given IV fluids, close attention will need to be paid to volume status. 6. DVT prophylaxis. Subcutaneous heparin 7. Cognitive impairment. Patient presenting with failure to thrive/functional decline in setting of underlying infectious process. Providing IV fluids and IV antimicrobial therapy. Will consult physical therapy and occupational therapy.   Code Status: CODE STATUS discussed with family members she is a full code Family Communication: I discussed patient's care with her daughter who was present at bedside Disposition Plan: Anticipate patient will require greater than 2 nights hospitalization will admit to inpatient service  Time spent: 70 min  Jeralyn Bennett [facility] Pager [phone]

## 2015-05-17 NOTE — ED Notes (Signed)
Nurse drawing labs. 

## 2015-05-17 NOTE — ED Notes (Signed)
Pt can go up at 17:40

## 2015-05-17 NOTE — ED Notes (Signed)
Critical lactic acid of 2.3

## 2015-05-18 ENCOUNTER — Inpatient Hospital Stay (HOSPITAL_COMMUNITY): Payer: Medicare Other

## 2015-05-18 LAB — BASIC METABOLIC PANEL
ANION GAP: 9 (ref 5–15)
BUN: 34 mg/dL — ABNORMAL HIGH (ref 6–20)
CHLORIDE: 108 mmol/L (ref 101–111)
CO2: 24 mmol/L (ref 22–32)
Calcium: 10.5 mg/dL — ABNORMAL HIGH (ref 8.9–10.3)
Creatinine, Ser: 1.2 mg/dL — ABNORMAL HIGH (ref 0.44–1.00)
GFR calc Af Amer: 47 mL/min — ABNORMAL LOW (ref >60)
GFR, EST NON AFRICAN AMERICAN: 41 mL/min — AB (ref >60)
Glucose, Bld: 122 mg/dL — ABNORMAL HIGH (ref 65–99)
POTASSIUM: 3.5 mmol/L (ref 3.5–5.1)
SODIUM: 141 mmol/L (ref 135–145)

## 2015-05-18 LAB — CBC
HEMATOCRIT: 41.1 % (ref 36.0–46.0)
HEMOGLOBIN: 13 g/dL (ref 12.0–15.0)
MCH: 26.4 pg (ref 26.0–34.0)
MCHC: 31.6 g/dL (ref 30.0–36.0)
MCV: 83.4 fL (ref 78.0–100.0)
Platelets: 152 10*3/uL (ref 150–400)
RBC: 4.93 MIL/uL (ref 3.87–5.11)
RDW: 14.5 % (ref 11.5–15.5)
WBC: 12.1 10*3/uL — AB (ref 4.0–10.5)

## 2015-05-18 LAB — TSH: TSH: 3.131 u[IU]/mL (ref 0.350–4.500)

## 2015-05-18 NOTE — Evaluation (Signed)
Occupational Therapy Evaluation Patient Details Name: [name] MRN: [id number] DOB: [birth year] Today's Date: 05/18/2015    History of Present Illness Pt was admitted for FTT/pna. She had a 3 day decline in function at home.  Pt has h/o cognitive impairment   Clinical Impression   This 81 year old female was admitted for the above.  Per chart, she lives at home with son.  Uncertain about PLOF with ADLs, but she was admitted with functional decline in ADLs.  She will benefit from skilled OT.  Goals are for assist of one from min to max A.    Follow Up Recommendations  Supervision/Assistance - 24 hour (with HHOT vs SNF depending upon progress)    Equipment Recommendations  3 in 1 bedside comode (if pt doesn't have)    Recommendations for Other Services       Precautions / Restrictions Precautions Precautions: Fall Restrictions Weight Bearing Restrictions: No      Mobility Bed Mobility Overal bed mobility: Needs Assistance Bed Mobility: Supine to Sit     Supine to sit: Mod assist     General bed mobility comments: used bed rail, assist to guide LEs and assist for trunk  Transfers Overall transfer level: Needs assistance Equipment used: None (attempted RW but pt pushed out of the way; held armrests) Transfers: Sit to/from [employer] Sit to Stand: Min assist;+2 safety/equipment Stand pivot transfers: Min assist;+2 safety/equipment       General transfer comment: pt did better without cues to scoot forward; cues to continue to turn, hand placement on chair/BSC extra time to initiate and move    Balance                                            ADL Overall ADL's : Needs assistance/impaired     Grooming: Wash/dry hands;Wash/dry face;Set up;Sitting   Upper Body Bathing: Minimal assitance;Sitting   Lower Body Bathing: Moderate assistance;+2 for safety/equipment;Sit to/from stand   Upper Body Dressing : Set up;Sitting    Lower Body Dressing: Maximal assistance;+2 for safety/equipment;Sit to/from stand   Toilet Transfer: Minimal assistance;+2 for safety/equipment;BSC;RW (extra time)   Toileting- Clothing Manipulation and Hygiene: Total assistance;+2 for safety/equipment;Sit to/from stand         General ADL Comments: pt asked to use bathroom prior to getting up to chair.  Participated in ADL--cues for safety and sequencing     Vision     Perception     Praxis      Pertinent Vitals/Pain Pain Assessment: No/denies pain     Hand Dominance     Extremity/Trunk Assessment Upper Extremity Assessment Upper Extremity Assessment: Overall WFL for tasks assessed           Communication Communication Communication: No difficulties   Cognition Arousal/Alertness: Awake/alert Behavior During Therapy: WFL for tasks assessed/performed Overall Cognitive Status: No family/caregiver present to determine baseline cognitive functioning (h/o cognitive impairment) Area of Impairment: Orientation;Attention;Memory;Following commands;Safety/judgement;Problem solving Orientation Level: Place;Time;Situation Current Attention Level: Sustained Memory: Decreased short-term memory Following Commands: Follows one step commands with increased time Safety/Judgement: Decreased awareness of safety;Decreased awareness of deficits   Problem Solving: Slow processing;Decreased initiation;Requires verbal cues     General Comments       Exercises       Shoulder Instructions      Home Living Family/patient expects to be discharged to:: Private residence Living  Arrangements: Children                                      Prior Functioning/Environment          Comments: no family available:  likely needed some assistance with ADLs    OT Diagnosis: Generalized weakness   OT Problem List: Decreased strength;Decreased activity tolerance;Decreased knowledge of use of DME or AE;Decreased safety  awareness;Decreased cognition   OT Treatment/Interventions: Self-care/ADL training;DME and/or AE instruction;Patient/family education;Cognitive remediation/compensation    OT Goals(Current goals can be found in the care plan section) Acute Rehab OT Goals Patient Stated Goal: none stated; agreeable to getting OOB OT Goal Formulation: With patient Time For Goal Achievement: 05/25/15 Potential to Achieve Goals: Good ADL Goals Pt Will Perform Lower Body Bathing: with min assist;sit to/from stand Pt Will Perform Lower Body Dressing: with mod assist;sit to/from stand Pt Will Transfer to Toilet: with min assist;bedside commode;stand pivot transfer Pt Will Perform Toileting - Clothing Manipulation and hygiene: with max assist;sit to/from stand Additional ADL Goal #1: pt will perform bed mobility with min A in preparation for transfer to 3:1  OT Frequency: Min 2X/week   Barriers to D/C:            Co-evaluation              End of Session Nurse Communication: Mobility status (RN present; spoke to NT)  Activity Tolerance: Patient tolerated treatment well Patient left: in chair;with call bell/phone within reach;with chair alarm set   Time: [phone] OT Time Calculation (min): 31 min Charges:  OT General Charges $OT Visit: 1 Procedure OT Evaluation $Initial OT Evaluation Tier I: 1 Procedure OT Treatments $Self Care/Home Management : 8-22 mins G-Codes:    [name] May 21, 2015, 8:40 AM [name], OTR/L [phone] 21-May-2015

## 2015-05-18 NOTE — Progress Notes (Signed)
[facility] PROGRESS NOTE  [name] GNF:[id number] DOB: 1930-06-21 DOA: 05/17/2015 PCP: Garlan Fillers, MD  Assessment/Plan: 1. Urinary tract infection. -I suspect that urinary tract infection may have precipitated functional decline. -Continue ceftriaxone 1 g IV every 24 hours. Urine cultures are pending at the time of this dictation -Continue supportive care. Follow-up on cultures.  2.  Possible community-acquired pneumonia -Family members reported patient having increase cough, initial chest x-ray showed a subtle right base also opacification which radiology reporting could be atelectasis versus pneumonia. -Repeat chest x-ray showed mild volume loss at the right base which could be atelectasis or atelectatic pneumonia. -For now will continue empiric IV antimicrobial coverage with ceftriaxone and azithromycin  3.  Acute kidney injury. -Likely secondary to decreased by mouth intake in combination with diuretic therapy. -Creatinine trending down from 1.6 on admission to 1.2 -Will encourage oral intake and stop IV fluids.  4.  History of moderate aortic stenosis -Her last transthoracic echocardiogram performed on 11/24/2013 showing moderate aortic stenosis. -I discontinued Lasix therapy due to dehydration and acute renal failure. -Will stop IV fluids today, encourage oral intake -She does not have clinical evidence of acute decompensated heart failure. Continue holding Lasix for now.  5. History of hypertension. -Her systolic blood pressures have fluctuated the 120s to 140s over the course of the day, we'll continue holding antihypertensives agents for now.  6.  Functional decline/failure to thrive -Likely precipitated by infectious process -Family members reporting that she continues to have poor appetite and appears weak. -Physical therapy was consulted -We'll continue providing supportive care, treating infectious process with antimicrobial therapy   Code Status:  Full Code Family Communication: I spoke with family members present at bedside Disposition Plan: Will continue supportive care, IV AB treatment, encourage mobility.    Consultants:  PT   Antibiotics:  Rocephin   Azithromycin  HPI/Subjective: Patient is a pleasant 81 year old female with history of cognitive impairment, type 2 diabetes mellitus, hypertension, admitted to medicine service on 05/17/2015 when she presented with complaints of generalized weakness. Family members noted a significant functional decline having poor tolerance to physical exertion, fatigue, excessive daytime sleepiness, and minimal by mouth intake. Initial lab work in the emergency department revealed acute kidney injury having a creatinine of 1.6. Suspected infectious process precipitating functional decline/delirium. She was started on IV fluids along with empiric IV antibiotic therapy. Urinalysis showed the presence of bacteria along with nitrates and leukocyte Estrace. On her chest x-ray radiology reported possible atelectasis versus pneumonia.    Objective: Filed Vitals:   05/18/15 1420  BP: 125/63  Pulse: 96  Temp: 98.4 F (36.9 C)  Resp: 18    Intake/Output Summary (Last 24 hours) at 05/18/15 1655 Last data filed at 05/18/15 1420  Gross per 24 hour  Intake    590 ml  Output      0 ml  Net    590 ml   There were no vitals filed for this visit.  Exam:   General:  Patient was sleeping, arousable, seems more verbal today.  Cardiovascular: 2-6 systolic ejection murmur, regular rate rhythm normal S1-S2  Respiratory: Normal respiratory effort, lungs were clear to auscultation bilaterally no wheezing rhonchi or rales  Abdomen: Soft nontender nondistended  Musculoskeletal: No extremity edema  Data Reviewed: Basic Metabolic Panel:  Recent Labs Lab 05/17/15 1306 05/18/15 0610  NA 138 141  K 3.8 3.5  CL 105 108  CO2 22 24  GLUCOSE 137* 122*  BUN 30* 34*  CREATININE 1.60*  1.20*   CALCIUM 10.4* 10.5*   Liver Function Tests:  Recent Labs Lab 05/17/15 1306  AST 36  ALT 15  ALKPHOS 63  BILITOT 2.1*  PROT 7.8  ALBUMIN 3.4*   No results for input(s): LIPASE, AMYLASE in the last 168 hours. No results for input(s): AMMONIA in the last 168 hours. CBC:  Recent Labs Lab 05/17/15 1306 05/18/15 0610  WBC 9.7 12.1*  NEUTROABS 7.8*  --   HGB 12.6 13.0  HCT 40.1 41.1  MCV 84.1 83.4  PLT 135* 152   Cardiac Enzymes:  Recent Labs Lab 05/17/15 1306  TROPONINI 0.08*   BNP (last 3 results) No results for input(s): BNP in the last 8760 hours.  ProBNP (last 3 results) No results for input(s): PROBNP in the last 8760 hours.  CBG: No results for input(s): GLUCAP in the last 168 hours.  Recent Results (from the past 240 hour(s))  Urine culture     Status: None (Preliminary result)   Collection Time: 05/17/15  6:23 PM  Result Value Ref Range Status   Specimen Description URINE, CATHETERIZED  Final   Special Requests NONE  Final   Culture   Final    NO GROWTH < 24 HOURS Performed at [facility]    Report Status PENDING  Incomplete     Studies: Dg Chest 2 View  05/17/2015   CLINICAL DATA:  Decreased PO intake over the past few days. Hypertension.  EXAM: CHEST  2 VIEW  COMPARISON:  11/23/2013  FINDINGS: Left-sided pacemaker unchanged. Lungs are hypoinflated with subtle right base opacification which may be due to atelectasis or infection. Mild stable cardiomegaly. Remainder the exam is unchanged.  IMPRESSION: Subtle right base opacification which may be due to atelectasis or infection.  Stable mild cardiomegaly.   Electronically Signed   By: Elberta Fortis M.D.   On: 05/17/2015 14:15   Portable Chest 1 View  05/18/2015   CLINICAL DATA:  Community acquired pneumonia.  Follow-up.  EXAM: PORTABLE CHEST - 1 VIEW  COMPARISON:  05/17/2015.  11/23/2013.  FINDINGS: Dual lead pacemaker inserted from a left subclavian approach is unchanged with leads in the  right atrium and right ventricle. Atrial lead projection is somewhat different, but I believe this is positional. Heart size is at the upper limits of normal. There is atherosclerosis of the aorta. There is venous hypertension possibly with early interstitial edema. There is mild volume loss at the right base that could be atelectasis or minimal pneumonia. No consolidation or lobar collapse. No visible effusion. Bony structures unremarkable.  IMPRESSION: Suspicion of venous hypertension with early interstitial edema.  Mild volume loss at the right base could be atelectasis or mild atelectatic pneumonia. No dense consolidation or lobar collapse.   Electronically Signed   By: Paulina Fusi M.D.   On: 05/18/2015 08:52    Scheduled Meds: . aspirin EC  81 mg Oral Daily  . azithromycin  500 mg Intravenous Q24H  . cefTRIAXone (ROCEPHIN)  IV  1 g Intravenous Q24H  . donepezil  10 mg Oral QHS  . heparin  5,000 Units Subcutaneous 3 times per day  . rosuvastatin  10 mg Oral Daily   Continuous Infusions: . sodium chloride 75 mL/hr at 05/18/15 1156    Principal Problem:   FTT (failure to thrive) in adult Active Problems:   CAP (community acquired pneumonia)   AKI (acute kidney injury)   MITRAL INSUFFICIENCY   Essential hypertension, benign    Time spent: 30 min  Jeralyn Bennett  [facility] Pager [phone]. If 7PM-7AM, please contact night-coverage at [email or web address], password [facility] 05/18/2015, 4:55 PM  LOS: 1 day
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
## 2015-05-19 DIAGNOSIS — I080 Rheumatic disorders of both mitral and aortic valves: Secondary | ICD-10-CM

## 2015-05-19 LAB — URINE CULTURE: CULTURE: NO GROWTH

## 2015-05-19 LAB — BASIC METABOLIC PANEL
Anion gap: 8 (ref 5–15)
BUN: 32 mg/dL — AB (ref 6–20)
CO2: 24 mmol/L (ref 22–32)
CREATININE: 1.1 mg/dL — AB (ref 0.44–1.00)
Calcium: 9.6 mg/dL (ref 8.9–10.3)
Chloride: 107 mmol/L (ref 101–111)
GFR calc Af Amer: 52 mL/min — ABNORMAL LOW (ref >60)
GFR, EST NON AFRICAN AMERICAN: 45 mL/min — AB (ref >60)
Glucose, Bld: 177 mg/dL — ABNORMAL HIGH (ref 65–99)
Potassium: 3.4 mmol/L — ABNORMAL LOW (ref 3.5–5.1)
SODIUM: 139 mmol/L (ref 135–145)

## 2015-05-19 LAB — CBC
HCT: 37.9 % (ref 36.0–46.0)
Hemoglobin: 12.1 g/dL (ref 12.0–15.0)
MCH: 26.3 pg (ref 26.0–34.0)
MCHC: 31.9 g/dL (ref 30.0–36.0)
MCV: 82.4 fL (ref 78.0–100.0)
PLATELETS: 170 10*3/uL (ref 150–400)
RBC: 4.6 MIL/uL (ref 3.87–5.11)
RDW: 14.4 % (ref 11.5–15.5)
WBC: 8.8 10*3/uL (ref 4.0–10.5)

## 2015-05-19 MED ORDER — POTASSIUM CHLORIDE CRYS ER 20 MEQ PO TBCR
40.0000 meq | EXTENDED_RELEASE_TABLET | Freq: Once | ORAL | Status: AC
  Administered 2015-05-19: 40 meq via ORAL
  Filled 2015-05-19: qty 2

## 2015-05-19 MED ORDER — AZITHROMYCIN 500 MG PO TABS
500.0000 mg | ORAL_TABLET | Freq: Every evening | ORAL | Status: DC
  Administered 2015-05-19 – 2015-05-23 (×5): 500 mg via ORAL
  Filled 2015-05-19 (×6): qty 1

## 2015-05-19 NOTE — Progress Notes (Signed)
[facility] PROGRESS NOTE  [name] ZOX:[id number] DOB: [birth year] DOA: 05/17/2015 PCP: Garlan Fillers, MD  Assessment/Plan: 1. Urinary tract infection. -I suspect that urinary tract infection may have precipitated functional decline. -Continue ceftriaxone 1 g IV every 24 hours. Urine cultures are pending at the time of this dictation -Continue supportive care. Follow-up on cultures.  2.  Possible community-acquired pneumonia -Family members reported patient having increase cough, initial chest x-ray showed a subtle right base also opacification which radiology reporting could be atelectasis versus pneumonia. -Continue empiric IV antimicrobial coverage with ceftriaxone and azithromycin  3.  Acute kidney injury. -Likely secondary to decreased by mouth intake in combination with diuretic therapy. -Creatinine now stabilized at 1.1 -Will encourage oral intake, stopped IV fluids.  4.  History of moderate aortic stenosis -Her last transthoracic echocardiogram performed on 11/24/2013 showing moderate aortic stenosis. -I discontinued Lasix therapy due to dehydration and acute renal failure. -Will stop IV fluids today, encourage oral intake -She does not have clinical evidence of acute decompensated heart failure. Continue holding Lasix for now.  5. History of hypertension. -Blood pressures continue to remain stable off of antihypertensives agents   6.  Functional decline/failure to thrive -Likely precipitated by infectious process -Family members reporting that she continues to have poor appetite and appears weak. -Physical therapy was consulted -She continues to have generalized weakness, had difficulty getting her out of bed to bedside chair today. Although her lab work shows improvement and she appears to be more awake and alert, she seems white weak and deconditioned. Will likely require skilled nursing facility for rehabilitation  Code Status: Full Code Family  Communication: I spoke with family members present at bedside Disposition Plan: Will continue supportive care, IV AB treatment, encourage mobility.    Consultants:  PT   Antibiotics:  Rocephin   Azithromycin  HPI/Subjective: Patient is a pleasant 81 year old female with history of cognitive impairment, type 2 diabetes mellitus, hypertension, admitted to medicine service on 05/17/2015 when she presented with complaints of generalized weakness. Family members noted a significant functional decline having poor tolerance to physical exertion, fatigue, excessive daytime sleepiness, and minimal by mouth intake. Initial lab work in the emergency department revealed acute kidney injury having a creatinine of 1.6. Suspected infectious process precipitating functional decline/delirium. She was started on IV fluids along with empiric IV antibiotic therapy. Urinalysis showed the presence of bacteria along with nitrates and leukocyte Estrace. On her chest x-ray radiology reported possible atelectasis versus pneumonia.    Objective: Filed Vitals:   05/19/15 0440  BP: 131/64  Pulse: 87  Temp: 99.3 F (37.4 C)  Resp: 18    Intake/Output Summary (Last 24 hours) at 05/19/15 1444 Last data filed at 05/19/15 0600  Gross per 24 hour  Intake  562.5 ml  Output      0 ml  Net  562.5 ml   There were no vitals filed for this visit.  Exam:   General:  Patient was sleeping, arousable, seems more verbal today.  Cardiovascular: 2-6 systolic ejection murmur, regular rate rhythm normal S1-S2  Respiratory: Normal respiratory effort, lungs were clear to auscultation bilaterally no wheezing rhonchi or rales  Abdomen: Soft nontender nondistended  Musculoskeletal: No extremity edema  Data Reviewed: Basic Metabolic Panel:  Recent Labs Lab 05/17/15 1306 05/18/15 0610 05/19/15 0524  NA 138 141 139  K 3.8 3.5 3.4*  CL 105 108 107  CO2 GLUCOSE 137* 122* 177*  BUN 30* 34* 32*   CREATININE  1.60* 1.20* 1.10*  CALCIUM 10.4* 10.5* 9.6   Liver Function Tests:  Recent Labs Lab 05/17/15 1306  AST 36  ALT 15  ALKPHOS 63  BILITOT 2.1*  PROT 7.8  ALBUMIN 3.4*   No results for input(s): LIPASE, AMYLASE in the last 168 hours. No results for input(s): AMMONIA in the last 168 hours. CBC:  Recent Labs Lab 05/17/15 1306 05/18/15 0610 05/19/15 0524  WBC 9.7 12.1* 8.8  NEUTROABS 7.8*  --   --   HGB 12.6 13.0 12.1  HCT 40.1 41.1 37.9  MCV 84.1 83.4 82.4  PLT 135* 152 170   Cardiac Enzymes:  Recent Labs Lab 05/17/15 1306  TROPONINI 0.08*   BNP (last 3 results) No results for input(s): BNP in the last 8760 hours.  ProBNP (last 3 results) No results for input(s): PROBNP in the last 8760 hours.  CBG: No results for input(s): GLUCAP in the last 168 hours.  Recent Results (from the past 240 hour(s))  Urine culture     Status: None   Collection Time: 05/17/15  6:23 PM  Result Value Ref Range Status   Specimen Description URINE, CATHETERIZED  Final   Special Requests NONE  Final   Culture   Final    NO GROWTH 2 DAYS Performed at [facility]    Report Status 05/19/2015 FINAL  Final     Studies: Portable Chest 1 View  05/18/2015   CLINICAL DATA:  Community acquired pneumonia.  Follow-up.  EXAM: PORTABLE CHEST - 1 VIEW  COMPARISON:  05/17/2015.  11/23/2013.  FINDINGS: Dual lead pacemaker inserted from a left subclavian approach is unchanged with leads in the right atrium and right ventricle. Atrial lead projection is somewhat different, but I believe this is positional. Heart size is at the upper limits of normal. There is atherosclerosis of the aorta. There is venous hypertension possibly with early interstitial edema. There is mild volume loss at the right base that could be atelectasis or minimal pneumonia. No consolidation or lobar collapse. No visible effusion. Bony structures unremarkable.  IMPRESSION: Suspicion of venous hypertension with  early interstitial edema.  Mild volume loss at the right base could be atelectasis or mild atelectatic pneumonia. No dense consolidation or lobar collapse.   Electronically Signed   By: Paulina Fusi M.D.   On: 05/18/2015 08:52    Scheduled Meds: . aspirin EC  81 mg Oral Daily  . azithromycin  500 mg Oral QPM  . cefTRIAXone (ROCEPHIN)  IV  1 g Intravenous Q24H  . donepezil  10 mg Oral QHS  . heparin  5,000 Units Subcutaneous 3 times per day  . rosuvastatin  10 mg Oral Daily   Continuous Infusions:    Principal Problem:   FTT (failure to thrive) in adult Active Problems:   CAP (community acquired pneumonia)   AKI (acute kidney injury)   MITRAL INSUFFICIENCY   Essential hypertension, benign    Time spent: 25 min    Jeralyn Bennett  [facility] Pager [phone]. If 7PM-7AM, please contact night-coverage at [email or web address], password [facility] 05/19/2015, 2:44 PM  LOS: 2 days

## 2015-05-19 NOTE — Progress Notes (Signed)
PHARMACIST - PHYSICIAN COMMUNICATION DR:  Vanessa Barbara CONCERNING: Antibiotic IV to Oral Route Change Policy  RECOMMENDATION: This patient is receiving Zithromax by the intravenous route.  Based on criteria approved by the Pharmacy and Therapeutics Committee, the antibiotic(s) is/are being converted to the equivalent oral dose form(s).   DESCRIPTION: These criteria include:  Patient being treated for a respiratory tract infection, urinary tract infection, cellulitis or clostridium difficile associated diarrhea if on metronidazole  The patient is not neutropenic and does not exhibit a GI malabsorption state  The patient is eating (either orally or via tube) and/or has been taking other orally administered medications for a least 24 hours  The patient is improving clinically and has a Tmax < 100.5  If you have questions about this conversion, please contact the Pharmacy Department    [phone] )  [facility]   [phone] )  [facility]   [phone] )  [facility]   [phone] )  [facility]   [phone] )  [facility]   Junita Push, PharmD, BCPS Pager: [phone] 05/19/2015@11 :10 AM

## 2015-05-20 ENCOUNTER — Inpatient Hospital Stay (HOSPITAL_COMMUNITY): Payer: Medicare Other

## 2015-05-20 LAB — CBC
HCT: 39.7 % (ref 36.0–46.0)
HEMOGLOBIN: 13 g/dL (ref 12.0–15.0)
MCH: 26.8 pg (ref 26.0–34.0)
MCHC: 32.7 g/dL (ref 30.0–36.0)
MCV: 81.9 fL (ref 78.0–100.0)
Platelets: 203 10*3/uL (ref 150–400)
RBC: 4.85 MIL/uL (ref 3.87–5.11)
RDW: 14.6 % (ref 11.5–15.5)
WBC: 8.4 10*3/uL (ref 4.0–10.5)

## 2015-05-20 LAB — BASIC METABOLIC PANEL
ANION GAP: 10 (ref 5–15)
BUN: 31 mg/dL — AB (ref 6–20)
CHLORIDE: 108 mmol/L (ref 101–111)
CO2: 23 mmol/L (ref 22–32)
Calcium: 10.1 mg/dL (ref 8.9–10.3)
Creatinine, Ser: 1.26 mg/dL — ABNORMAL HIGH (ref 0.44–1.00)
GFR calc Af Amer: 44 mL/min — ABNORMAL LOW (ref >60)
GFR calc non Af Amer: 38 mL/min — ABNORMAL LOW (ref >60)
GLUCOSE: 165 mg/dL — AB (ref 65–99)
POTASSIUM: 4 mmol/L (ref 3.5–5.1)
SODIUM: 141 mmol/L (ref 135–145)

## 2015-05-20 MED ORDER — FUROSEMIDE 20 MG PO TABS
10.0000 mg | ORAL_TABLET | Freq: Every day | ORAL | Status: DC
  Administered 2015-05-20 – 2015-05-22 (×3): 10 mg via ORAL
  Filled 2015-05-20 (×3): qty 0.5

## 2015-05-20 NOTE — Clinical Social Work Note (Signed)
Clinical Social Work Assessment  Patient Details  Name: [name] MRN: [id number] Date of Birth: December 12, 1929  Date of referral:  05/20/15               Reason for consult:  Discharge Planning                Permission sought to share information with:  Family Supports Permission granted to share information::     Name::     [name]  Agency::     Relationship::     Contact Information:  [phone]  Housing/Transportation Living arrangements for the past 2 months:  Single Family Home Source of Information:  Adult Children Patient Interpreter Needed:  None Criminal Activity/Legal Involvement Pertinent to Current Situation/Hospitalization:  No - Comment as needed Significant Relationships:  Adult Children Lives with:  Adult Children Do you feel safe going back to the place where you live?  No Need for family participation in patient care:  Yes (Comment)  Care giving concerns:  Pt admitted from home with pt children. PT recommending SNF.   Social Worker assessment / plan:  CSW received referral for New SNF.   CSW received return phone call from pt daughter, [name]. CSW introduced self and explained role. Pt daughter reports that pt was living at home with pt sons. Pt daughter reports that pt was able to walk with a walker prior to admission. CSW discussed with pt daughter that pt is requiring a lot of assistance with ADL's at this time and PT recommending SNF. Pt daughter will speak to pt other children, but is agreeable to CSW initiating SNF search to [facility].  CSW completed FL2 and initiated SNF search to [facility].  CSW followed up with pt daughter with bed offers and pt daughter chooses bed at [facility].  CSW contacted [facility] to notify of acceptance of bed offer.  Per MD, pt not yet medically ready for discharge today.  CSW to continue to follow to provide support and assist with pt disposition needs.   Employment status:   Retired Health and safety inspector:  Medicare PT Recommendations:    Information / Referral to community resources:  Skilled Nursing Facility  Patient/Family's Response to care:  Pt oriented to person only. Pt daughter supportive and actively involved in pt care. Pt daughter recognizes the decline in pt and does not feel that pt family can continue to care for pt at home.  Patient/Family's Understanding of and Emotional Response to Diagnosis, Current Treatment, and Prognosis:  Pt daughter was eager to speak with MD to discuss pt medical situation and gain better understanding if pt family can anticipate any type of recovery from pt.   Emotional Assessment Appearance:  Appears stated age Attitude/Demeanor/Rapport:  Other (pt appropriate) Affect (typically observed):  Appropriate Orientation:  Oriented to Self Alcohol / Substance use:  Not Applicable Psych involvement (Current and /or in the community):  No (Comment)  Discharge Needs  Concerns to be addressed:  Discharge Planning Concerns Readmission within the last 30 days:  No Current discharge risk:  Physical Impairment Barriers to Discharge:  Continued Medical Work up   Loletta Specter A, LCSW 05/20/2015, 4:51 PM  [phone]

## 2015-05-20 NOTE — Care Management Important Message (Signed)
Important Message  Patient Details  Name: [name] MRN: [id number] Date of Birth: [birth year]   Medicare Important Message Given:  Yes-second notification given    Haskell Flirt 05/20/2015, 11:27 AMImportant Message  Patient Details  Name: [name] MRN: [id number] Date of Birth: [birth year]   Medicare Important Message Given:  Yes-second notification given    Haskell Flirt 05/20/2015, 11:27 AM

## 2015-05-20 NOTE — Progress Notes (Signed)
CSW received referral for New SNF.   CSW visited pt room and no family present at bedside. CSW reviewed chart and per chart, pt disoriented x 4.   CSW contacted pt daughter, [name] via telephone and left voice message.   CSW to await return phone call from pt daughter in order to complete full psychosocial assessment and discuss disposition planning.  CSW to continue to follow.  Loletta Specter, MSW, LCSW Clinical Social Work [phone]

## 2015-05-20 NOTE — Progress Notes (Signed)
[facility] PROGRESS NOTE  [name] [id number]:[id number] DOB: 02-14-1930 DOA: 05/17/2015 PCP: Garlan Fillers, MD  Assessment/Plan: 1. Urinary tract infection. -I suspect that urinary tract infection may have precipitated functional decline. -Continue ceftriaxone 1 g IV every 24 hours. Urine cultures are pending at the time of this dictation -Her urine cultures have not shown growth to date.  2.  Possible community-acquired pneumonia -Family members reported patient having increase cough, initial chest x-ray showed a subtle right base also opacification which radiology reporting could be atelectasis versus pneumonia. -Continue empiric IV antimicrobial coverage with ceftriaxone and azithromycin -A chest x-ray revealed possible interstitial pulmonary edema related to CHF. -IV fluids have been discontinued.  3.  Acute kidney injury. -Likely secondary to decreased by mouth intake in combination with diuretic therapy. -Creatinine now stabilized at 1.1 -Will encourage oral intake, stopped IV fluids.  4.  History of moderate aortic stenosis -Her last transthoracic echocardiogram performed on 11/24/2013 showing moderate aortic stenosis. -I discontinued Lasix therapy due to dehydration and acute renal failure. -IV fluids were discontinued -Chest x-ray showing suspected interstitial pulmonary edema related to CHF, will restart her Lasix at 10 mg by mouth daily  5. History of hypertension. -Blood pressures continue to remain stable off of antihypertensives agents   6.  Functional decline/failure to thrive -Likely precipitated by infectious process -Family members reporting that she continues to have poor appetite and appears weak. -Physical therapy was consulted -She continues to have generalized weakness, had difficulty getting her out of bed to bedside chair today. Although her lab work shows improvement and she appears to be more awake and alert, she seems white weak and  deconditioned. Will likely require skilled nursing facility for rehabilitation  Code Status: Full Code Family Communication: I spoke with family members present at bedside Disposition Plan: Patient will likely require skilled nursing facility placement   Consultants:  PT   Antibiotics:  Rocephin   Azithromycin  HPI/Subjective: Patient is a pleasant 81 year old female with history of cognitive impairment, type 2 diabetes mellitus, hypertension, admitted to medicine service on 05/17/2015 when she presented with complaints of generalized weakness. Family members noted a significant functional decline having poor tolerance to physical exertion, fatigue, excessive daytime sleepiness, and minimal by mouth intake. Initial lab work in the emergency department revealed acute kidney injury having a creatinine of 1.6. Suspected infectious process precipitating functional decline/delirium. She was started on IV fluids along with empiric IV antibiotic therapy. Urinalysis showed the presence of bacteria along with nitrates and leukocyte Estrace. On her chest x-ray radiology reported possible atelectasis versus pneumonia.    Objective: Filed Vitals:   05/20/15 1417  BP: 120/72  Pulse: 99  Temp: 98.4 F (36.9 C)  Resp: 18    Intake/Output Summary (Last 24 hours) at 05/20/15 1727 Last data filed at 05/20/15 1117  Gross per 24 hour  Intake    120 ml  Output      0 ml  Net    120 ml   There were no vitals filed for this visit.  Exam:   General:  Had difficulty getting out of bed to chair secondary to generalized weakness  Cardiovascular: 2-6 systolic ejection murmur, regular rate rhythm normal S1-S2  Respiratory: Normal respiratory effort, lungs were clear to auscultation bilaterally no wheezing rhonchi or rales  Abdomen: Soft nontender nondistended  Musculoskeletal: No extremity edema  Data Reviewed: Basic Metabolic Panel:  Recent Labs Lab 05/17/15 1306 05/18/15 0610  05/19/15 0524 05/20/15 0602  NA 138 141 139  141  K 3.8 3.5 3.4* 4.0  CL 105 108 107 108  CO2 GLUCOSE 137* 122* 177* 165*  BUN 30* 34* 32* 31*  CREATININE 1.60* 1.20* 1.10* 1.26*  CALCIUM 10.4* 10.5* 9.6 10.1   Liver Function Tests:  Recent Labs Lab 05/17/15 1306  AST 36  ALT 15  ALKPHOS 63  BILITOT 2.1*  PROT 7.8  ALBUMIN 3.4*   No results for input(s): LIPASE, AMYLASE in the last 168 hours. No results for input(s): AMMONIA in the last 168 hours. CBC:  Recent Labs Lab 05/17/15 1306 05/18/15 0610 05/19/15 0524 05/20/15 0602  WBC 9.7 12.1* 8.8 8.4  NEUTROABS 7.8*  --   --   --   HGB 12.6 13.0 12.1 13.0  HCT 40.1 41.1 37.9 39.7  MCV 84.1 83.4 82.4 81.9  PLT 135* 152 170 203   Cardiac Enzymes:  Recent Labs Lab 05/17/15 1306  TROPONINI 0.08*   BNP (last 3 results) No results for input(s): BNP in the last 8760 hours.  ProBNP (last 3 results) No results for input(s): PROBNP in the last 8760 hours.  CBG: No results for input(s): GLUCAP in the last 168 hours.  Recent Results (from the past 240 hour(s))  Urine culture     Status: None   Collection Time: 05/17/15  6:23 PM  Result Value Ref Range Status   Specimen Description URINE, CATHETERIZED  Final   Special Requests NONE  Final   Culture   Final    NO GROWTH 2 DAYS Performed at [facility]    Report Status 05/19/2015 FINAL  Final     Studies: Dg Chest Port 1 View  05/20/2015   CLINICAL DATA:  Cough, history of diabetes hypertension coronary artery disease, former smoker  EXAM: PORTABLE CHEST - 1 VIEW  COMPARISON:  05/18/2015  FINDINGS: Cardiac pacer in unchanged position. Stable mild to moderate cardiac enlargement with uncoiling of the aorta. There is peribronchial cuffing bilaterally. There is moderate diffuse interstitial prominence, increased when compared to prior study. Tiny right pleural effusion.  IMPRESSION: Suspect interstitial pulmonary edema related to congestive heart  failure.   Electronically Signed   By: Esperanza Heir M.D.   On: 05/20/2015 07:31    Scheduled Meds: . aspirin EC  81 mg Oral Daily  . azithromycin  500 mg Oral QPM  . cefTRIAXone (ROCEPHIN)  IV  1 g Intravenous Q24H  . donepezil  10 mg Oral QHS  . heparin  5,000 Units Subcutaneous 3 times per day  . rosuvastatin  10 mg Oral Daily   Continuous Infusions:    Principal Problem:   FTT (failure to thrive) in adult Active Problems:   CAP (community acquired pneumonia)   AKI (acute kidney injury)   MITRAL INSUFFICIENCY   Essential hypertension, benign    Time spent: 20 min    Jeralyn Bennett  [facility] Pager [phone]. If 7PM-7AM, please contact night-coverage at [email or web address], password [facility] 05/20/2015, 5:27 PM  LOS: 3 days

## 2015-05-20 NOTE — Progress Notes (Signed)
Occupational Therapy Treatment Patient Details Name: [name] MRN: [id number] DOB: 1930-05-19 Today's Date: 05/20/2015    History of present illness 81 yo female admitted with FTT, Pna, AKI. Hx of DM, cognitive impairment, DM, HTN, CAD, aortic stenosis, CVA, 2* Mobitz, Alz.    OT comments  .added goal for BUE  Follow Up Recommendations  Supervision/Assistance - 24 hour;SNF          Precautions / Restrictions Precautions Precautions: Fall              ADL Overall ADL's : Needs assistance/impaired Eating/Feeding: Minimal assistance;Bed level   Grooming: Wash/dry hands;Wash/dry face;Set up;Bed level                                 General ADL Comments: added a goal for BUE .      Vision                     Perception     Praxis      Cognition   Behavior During Therapy: [facility] for tasks assessed/performed Overall Cognitive Status: No family/caregiver present to determine baseline cognitive functioning Area of Impairment: Orientation;Attention;Memory;Following commands;Safety/judgement;Problem solving Orientation Level: Place;Time;Situation Current Attention Level: Focused Memory: Decreased short-term memory  Following Commands: Follows one step commands inconsistently Safety/Judgement: Decreased awareness of safety;Decreased awareness of deficits   Problem Solving: Slow processing;Decreased initiation;Requires verbal cues;Requires tactile cues      Extremity/Trunk Assessment               Exercises General Exercises - Upper Extremity Shoulder Flexion: AROM;Both;10 reps Elbow Flexion: AROM;Both;10 reps Elbow Extension: AROM;Both;10 reps Wrist Flexion: AROM;Both;10 reps Wrist Extension: AROM;Both;10 reps Digit Composite Flexion: AROM;Both;10 reps Composite Extension: AROM;Both;10 reps   Shoulder Instructions       General Comments      Pertinent Vitals/ Pain       Pain Assessment: No/denies pain Faces Pain Scale:  Hurts whole lot Pain Location: L LE with touch and movement - states entire LE Pain Descriptors / Indicators: Aching;Sore Pain Intervention(s): Limited activity within patient's tolerance;Monitored during session;Repositioned  Home Living                                          Prior Functioning/Environment              Frequency Min 2X/week     Progress Toward Goals  OT Goals(current goals can now be found in the care plan section)  Progress towards OT goals: Not progressing toward goals - comment  ADL Goals Additional ADL Goal #2: Pt will perform HEP including BUE exercise program to increase I with ADL activity.  Plan Discharge plan remains appropriate    Co-evaluation                 End of Session     Activity Tolerance Patient tolerated treatment well   Patient Left in bed   Nurse Communication Mobility status        Time: 1324-4010 OT Time Calculation (min): 10 min  Charges: OT General Charges $OT Visit: 1 Procedure OT Treatments $Therapeutic Exercise: 8-22 mins  REDDING, [place] Kung 05/20/2015, 3:57 PM

## 2015-05-20 NOTE — Progress Notes (Signed)
Physical Therapy Treatment Patient Details Name: [name] MRN: [id number] DOB: May 24, 1930 Today's Date: 05/20/2015    History of Present Illness 81 yo female admitted with FTT, Pna, AKI. Hx of DM, cognitive impairment, DM, HTN, CAD, aortic stenosis, CVA, 2* Mobitz, Alz.     PT Comments    Pt agreeable to mobility today however decreased ability to assist and reporting more pain then previous session.  Pt assisted to sitting EOB, unable to stand or use Stedy so assisted back to bed.  Recommend nursing staff lift pt OOB to recliner due to diagnosis of possible community acquired pneumonia as well as pt reporting increased saliva secretions.     Follow Up Recommendations  SNF;Supervision/Assistance - 24 hour     Equipment Recommendations  None recommended by PT    Recommendations for Other Services       Precautions / Restrictions Precautions Precautions: Fall    Mobility  Bed Mobility Overal bed mobility: Needs Assistance Bed Mobility: Supine to Sit;Sit to Supine     Supine to sit: Total assist;+2 for physical assistance Sit to supine: Total assist;+2 for physical assistance   General bed mobility comments: increased assist required for all bed mobility, pt agreeable however not initiating and when assisted reports pain especially in L LE, eases with rest  Transfers                 General transfer comment: pt unable at this time, attempted to assist OOB with stedy however pt unable to assist so stedy not used and pt returned to supine  Ambulation/Gait                 Stairs            Wheelchair Mobility    Modified Rankin (Stroke Patients Only)       Balance Overall balance assessment: Needs assistance   Sitting balance-Leahy Scale: Poor Sitting balance - Comments: propped onto R side (HOB slightly elevated), encouraged to sit upright however pt unable to maintain position Postural control: Right lateral lean                           Cognition Arousal/Alertness: Awake/alert Behavior During Therapy: WFL for tasks assessed/performed Overall Cognitive Status: No family/caregiver present to determine baseline cognitive functioning Area of Impairment: Orientation;Attention;Memory;Following commands;Safety/judgement;Problem solving Orientation Level: Place;Time;Situation Current Attention Level: Focused Memory: Decreased short-term memory Following Commands: Follows one step commands inconsistently Safety/Judgement: Decreased awareness of safety;Decreased awareness of deficits   Problem Solving: Slow processing;Decreased initiation;Requires verbal cues;Requires tactile cues      Exercises      General Comments        Pertinent Vitals/Pain Pain Assessment: Faces Faces Pain Scale: Hurts whole lot Pain Location: L LE with touch and movement - states entire LE Pain Descriptors / Indicators: Aching;Sore Pain Intervention(s): Limited activity within patient's tolerance;Monitored during session;Repositioned    Home Living                      Prior Function            PT Goals (current goals can now be found in the care plan section) Progress towards PT goals: Not progressing toward goals - comment (presents with more pain today)    Frequency  Min 3X/week    PT Plan Current plan remains appropriate    Co-evaluation  End of Session   Activity Tolerance: Patient limited by fatigue;Patient limited by pain Patient left: in bed;with call bell/phone within reach;with bed alarm set     Time: 1041-1105 PT Time Calculation (min) (ACUTE ONLY): 24 min  Charges:  $Therapeutic Activity: 23-37 mins                    G Codes:      LEMYRE,KATHrine E 06-07-15, 1:39 PM Zenovia Jarred, PT, DPT 06-07-2015 Pager: [phone]

## 2015-05-20 NOTE — Progress Notes (Signed)
Patient was given lasix on previous shift.  She voided but was incontinent.  She still sound very congested minimum breath sounds.  Complains of pain with the slightest touch to clean her up.  At this point refused perineal care, will attempt again later.  She was swinging her arm to hit NT.  Marcia Brash, RN.

## 2015-05-20 NOTE — Clinical Social Work Placement (Signed)
   CLINICAL SOCIAL WORK PLACEMENT  NOTE  Date:  05/20/2015  Patient Details  Name: [name] MRN: [id number] Date of Birth: [birth year]  Clinical Social Work is seeking post-discharge placement for this patient at the Skilled  Nursing Facility level of care (*CSW will initial, date and re-position this form in  chart as items are completed):  Yes   Patient/family provided with [place] Clinical Social Work Department's list of facilities offering this level of care within the geographic area requested by the patient (or if unable, by the patient's family).  Yes   Patient/family informed of their freedom to choose among providers that offer the needed level of care, that participate in Medicare, Medicaid or managed care program needed by the patient, have an available bed and are willing to accept the patient.  Yes   Patient/family informed of [facility]'s ownership interest in [facility] and [facility], as well as of the fact that they are under no obligation to receive care at these facilities.  PASRR submitted to EDS on 05/20/15     PASRR number received on 05/20/15     Existing PASRR number confirmed on       FL2 transmitted to all facilities in geographic area requested by pt/family on 05/20/15     FL2 transmitted to all facilities within larger geographic area on       Patient informed that his/her managed care company has contracts with or will negotiate with certain facilities, including the following:        Yes   Patient/family informed of bed offers received.  Patient chooses bed at [facility]     Physician recommends and patient chooses bed at      Patient to be transferred to   on  .  Patient to be transferred to facility by       Patient family notified on   of transfer.  Name of family member notified:        PHYSICIAN Please sign FL2     Additional Comment:    _______________________________________________ Orson Eva,  LCSW 05/20/2015, 4:54 PM

## 2015-05-20 NOTE — Progress Notes (Signed)
Unable to provide education due to patient's decreased congnitive level.  No family to visit patient during this RN's shift.

## 2015-05-20 NOTE — Care Management Note (Signed)
Case Management Note  Patient Details  Name: [name] MRN: [id number] Date of Birth: 09-Mar-1930  Subjective/Objective:         Admitted with UTI, AKI, PNA, FTT           Action/Plan: Discharge planning per CSW  Expected Discharge Date:   (unknown)               Expected Discharge Plan:  Skilled Nursing Facility  In-House Referral:  Clinical Social Work  Discharge planning Services  CM Consult  Post Acute Care Choice:  NA Choice offered to:  NA  DME Arranged:  N/A DME Agency:  NA  HH Arranged:  NA HH Agency:  NA  Status of Service:  Completed, signed off  Medicare Important Message Given:    Date Medicare IM Given:    Medicare IM give by:    Date Additional Medicare IM Given:    Additional Medicare Important Message give by:     If discussed at Long Length of Stay Meetings, dates discussed:    Additional Comments:  Alexis Goodell, RN 05/20/2015, 9:41 AM

## 2015-05-20 NOTE — Progress Notes (Signed)
Patient has bilat lower extremity dressings that appear to be kerlex and coban that are clean, dry and intact.  No wound care orders at this time.

## 2015-05-21 DIAGNOSIS — I5031 Acute diastolic (congestive) heart failure: Secondary | ICD-10-CM

## 2015-05-21 LAB — CBC
HEMATOCRIT: 39 % (ref 36.0–46.0)
HEMOGLOBIN: 12.6 g/dL (ref 12.0–15.0)
MCH: 26.6 pg (ref 26.0–34.0)
MCHC: 32.3 g/dL (ref 30.0–36.0)
MCV: 82.5 fL (ref 78.0–100.0)
Platelets: 215 10*3/uL (ref 150–400)
RBC: 4.73 MIL/uL (ref 3.87–5.11)
RDW: 14.8 % (ref 11.5–15.5)
WBC: 9.3 10*3/uL (ref 4.0–10.5)

## 2015-05-21 LAB — BASIC METABOLIC PANEL
ANION GAP: 9 (ref 5–15)
BUN: 40 mg/dL — ABNORMAL HIGH (ref 6–20)
CO2: 26 mmol/L (ref 22–32)
Calcium: 10.2 mg/dL (ref 8.9–10.3)
Chloride: 107 mmol/L (ref 101–111)
Creatinine, Ser: 1.34 mg/dL — ABNORMAL HIGH (ref 0.44–1.00)
GFR, EST AFRICAN AMERICAN: 41 mL/min — AB (ref >60)
GFR, EST NON AFRICAN AMERICAN: 36 mL/min — AB (ref >60)
Glucose, Bld: 156 mg/dL — ABNORMAL HIGH (ref 65–99)
POTASSIUM: 3.8 mmol/L (ref 3.5–5.1)
SODIUM: 142 mmol/L (ref 135–145)

## 2015-05-21 MED ORDER — FUROSEMIDE 10 MG/ML IJ SOLN
40.0000 mg | Freq: Once | INTRAMUSCULAR | Status: AC
  Administered 2015-05-21: 40 mg via INTRAVENOUS
  Filled 2015-05-21: qty 4

## 2015-05-21 NOTE — Progress Notes (Signed)
Patient coban dressing, and kerlix removed per [facility] nurse.    Skin cleaned.  Await WOC nurse for evaluation

## 2015-05-21 NOTE — Progress Notes (Signed)
CSW continuing to follow.   Pt for [facility] when medically ready for discharge.  Per MD, pt not yet medically ready for discharge.  CSW notified [employer].   CSW to continue to follow to provide support and assist with pt discharge needs.   Loletta Specter, MSW, LCSW Clinical Social Work [phone]

## 2015-05-21 NOTE — Progress Notes (Signed)
Allevyn applied x2 dressings.  Ortho tech called to apply coban and boots

## 2015-05-21 NOTE — Progress Notes (Signed)
Dr Vanessa Barbara text paged through amnion regarding orders to change lower extremity dressings.  Await response.

## 2015-05-21 NOTE — Progress Notes (Signed)
[facility] PROGRESS NOTE  [name] YNW:[id number] DOB: 25-Apr-1930 DOA: 05/17/2015 PCP: Garlan Fillers, MD  Assessment/Plan:   1. Urinary tract infection. -I suspect that urinary tract infection may have precipitated functional decline. -Continue ceftriaxone 1 g IV every 24 hours. Urine cultures are pending at the time of this dictation -Her urine cultures have not shown growth to date.  2.  Acute diastolic CHF/history of moderate or stenosis -On admission she was administered IV fluids due to dehydration along with acute kidney injury likely secondary to prerenal azotemia -IV fluids were discontinued the following day, and she was restarted on her oral diuretic therapy yesterday. -Today she appears to have increasing crackles and rales on physical examination. Her last chest x-ray performed on 05/20/2015 showed pulmonary edema related to congestive heart failure. -Will give her 40 mg of IV Lasix today x1 on top of the oral Lasix she has only received. -Will need to follow kidney function closely as her creatinine has trended up to 1.34. -Please reassess volume status in a.m.  3.  Acute kidney injury. -She initially presented in acute kidney injury with creatinine of 1.6 which trended down to 1.1 after holding diuretic since and given IV fluids. -Her oral diuretic therapy was restarted yesterday at her home dose of 10 mg daily. -Because of worsening crackles and rales she was given 40 mg of IV Lasix today -Will need to follow her kidney function closely in a.m.  4. History of hypertension. -Blood pressures continue to remain stable off of antihypertensives agents   5.  Functional decline/failure to thrive -Likely precipitated by infectious process -Family members reporting that she continues to have poor appetite and appears weak. -Physical therapy was consulted -She continues to have generalized weakness, had difficulty getting her out of bed to bedside chair today.  Although her lab work shows improvement and she appears to be more awake and alert, she seems white weak and deconditioned. Will likely require skilled nursing facility for rehabilitation  6.  Question can require pneumonia. -Chest x-ray showing findings more consistent with pulmonary edema rather than carry acquire pneumonia. She was treated empirically with ceftriaxone and azithromycin. -We'll discontinue azithromycin maintain her on ceftriaxone or now  Code Status: Full Code Family Communication: I spoke with family members present at bedside Disposition Plan: Patient will likely require skilled nursing facility placement   Consultants:  PT   Antibiotics:  Rocephin   Azithromycin  HPI/Subjective: Patient is a pleasant 81 year old female with history of cognitive impairment, type 2 diabetes mellitus, hypertension, admitted to medicine service on 05/17/2015 when she presented with complaints of generalized weakness. Family members noted a significant functional decline having poor tolerance to physical exertion, fatigue, excessive daytime sleepiness, and minimal by mouth intake. Initial lab work in the emergency department revealed acute kidney injury having a creatinine of 1.6. Suspected infectious process precipitating functional decline/delirium. She was started on IV fluids along with empiric IV antibiotic therapy. Urinalysis showed the presence of bacteria along with nitrates and leukocyte Estrace. On her chest x-ray radiology reported possible atelectasis versus pneumonia.    Objective: Filed Vitals:   05/21/15 1335  BP: 130/63  Pulse: 93  Temp: 98.7 F (37.1 C)  Resp: 18    Intake/Output Summary (Last 24 hours) at 05/21/15 1842 Last data filed at 05/21/15 1741  Gross per 24 hour  Intake    875 ml  Output      0 ml  Net    875 ml   [employer]  05/21/15 1216  Weight: 107.502 kg (237 lb)    Exam:   General:  Had difficulty getting out of bed to chair  secondary to generalized weakness  Cardiovascular: 2-6 systolic ejection murmur, regular rate rhythm normal S1-S2  Respiratory: Patient having increasing crackles and rales to bilateral lungs  Abdomen: Soft nontender nondistended  Musculoskeletal: No extremity edema, wraps in place.  Data Reviewed: Basic Metabolic Panel:  Recent Labs Lab 05/17/15 1306 05/18/15 0610 05/19/15 0524 05/20/15 0602 05/21/15 0714  NA 138 141 139 141 142  K 3.8 3.5 3.4* 4.0 3.8  CL 105 108 107 108 107  CO2 GLUCOSE 137* 122* 177* 165* 156*  BUN 30* 34* 32* 31* 40*  CREATININE 1.60* 1.20* 1.10* 1.26* 1.34*  CALCIUM 10.4* 10.5* 9.6 10.1 10.2   Liver Function Tests:  Recent Labs Lab 05/17/15 1306  AST 36  ALT 15  ALKPHOS 63  BILITOT 2.1*  PROT 7.8  ALBUMIN 3.4*   No results for input(s): LIPASE, AMYLASE in the last 168 hours. No results for input(s): AMMONIA in the last 168 hours. CBC:  Recent Labs Lab 05/17/15 1306 05/18/15 0610 05/19/15 0524 05/20/15 0602 05/21/15 0714  WBC 9.7 12.1* 8.8 8.4 9.3  NEUTROABS 7.8*  --   --   --   --   HGB 12.6 13.0 12.1 13.0 12.6  HCT 40.1 41.1 37.9 39.7 39.0  MCV 84.1 83.4 82.4 81.9 82.5  PLT 135* 152 170 203 215   Cardiac Enzymes:  Recent Labs Lab 05/17/15 1306  TROPONINI 0.08*   BNP (last 3 results) No results for input(s): BNP in the last 8760 hours.  ProBNP (last 3 results) No results for input(s): PROBNP in the last 8760 hours.  CBG: No results for input(s): GLUCAP in the last 168 hours.  Recent Results (from the past 240 hour(s))  Urine culture     Status: None   Collection Time: 05/17/15  6:23 PM  Result Value Ref Range Status   Specimen Description URINE, CATHETERIZED  Final   Special Requests NONE  Final   Culture   Final    NO GROWTH 2 DAYS Performed at [facility]    Report Status 05/19/2015 FINAL  Final     Studies: Dg Chest Port 1 View  05/20/2015   CLINICAL DATA:  Cough, history of  diabetes hypertension coronary artery disease, former smoker  EXAM: PORTABLE CHEST - 1 VIEW  COMPARISON:  05/18/2015  FINDINGS: Cardiac pacer in unchanged position. Stable mild to moderate cardiac enlargement with uncoiling of the aorta. There is peribronchial cuffing bilaterally. There is moderate diffuse interstitial prominence, increased when compared to prior study. Tiny right pleural effusion.  IMPRESSION: Suspect interstitial pulmonary edema related to congestive heart failure.   Electronically Signed   By: Esperanza Heir M.D.   On: 05/20/2015 07:31    Scheduled Meds: . aspirin EC  81 mg Oral Daily  . azithromycin  500 mg Oral QPM  . cefTRIAXone (ROCEPHIN)  IV  1 g Intravenous Q24H  . donepezil  10 mg Oral QHS  . furosemide  10 mg Oral Daily  . heparin  5,000 Units Subcutaneous 3 times per day  . rosuvastatin  10 mg Oral Daily   Continuous Infusions:    Principal Problem:   FTT (failure to thrive) in adult Active Problems:   CAP (community acquired pneumonia)   AKI (acute kidney injury)   MITRAL INSUFFICIENCY   Essential hypertension, benign  Time spent: 30 min    Jeralyn Bennett  [facility] Pager [phone]. If 7PM-7AM, please contact night-coverage at [email or web address], password [facility] 05/21/2015, 6:42 PM  LOS: 4 days

## 2015-05-21 NOTE — Consult Note (Signed)
WOC wound consult note Reason for Consult: Bilateral venous insufficiency wraps in place.  Patient is followed by [facility] twice weekly. Wound type:CVI (Chronic Venous Insufficiency) CEAP Level  6 Pressure Ulcer POA: No Measurement:Right medial malleolus:  4cm x 8cm area of recently healed wound with 2cm x 1.5cm x 0.2cm partial thickness tissue loss at periphery, pink, moist with scant serous exudate.  Left LE at pretibial area:  2.5cm x 2cm with no depth  (recently reepithelialized). No wound bed, no drainage. Wound bed:See above Drainage (amount, consistency, odor) See above Periwound:Intact, dry with linear crevices indicative of fluid loss Dressing procedure/placement/frequency: I will continue home POC with twice weekly Unna's Boots.  We will ask nursing to perform wound care and then call ortho tech for application of the bilateral boots. Additionally, I will provide bilateral Prevalon boots to provide pressure redistribution and pressure injury. WOC nursing team will not follow, but will remain available to this patient, the nursing and medical team.  Please re-consult if needed. Thanks, Ladona Mow, MSN, RN, GNP, [place], CWON-AP [phone])

## 2015-05-22 DIAGNOSIS — I350 Nonrheumatic aortic (valve) stenosis: Secondary | ICD-10-CM

## 2015-05-22 DIAGNOSIS — F0390 Unspecified dementia without behavioral disturbance: Secondary | ICD-10-CM | POA: Diagnosis present

## 2015-05-22 DIAGNOSIS — E669 Obesity, unspecified: Secondary | ICD-10-CM | POA: Diagnosis present

## 2015-05-22 LAB — CBC
HCT: 36.1 % (ref 36.0–46.0)
Hemoglobin: 11.5 g/dL — ABNORMAL LOW (ref 12.0–15.0)
MCH: 26.1 pg (ref 26.0–34.0)
MCHC: 31.9 g/dL (ref 30.0–36.0)
MCV: 82 fL (ref 78.0–100.0)
Platelets: 242 10*3/uL (ref 150–400)
RBC: 4.4 MIL/uL (ref 3.87–5.11)
RDW: 14.9 % (ref 11.5–15.5)
WBC: 10.9 10*3/uL — AB (ref 4.0–10.5)

## 2015-05-22 LAB — BASIC METABOLIC PANEL
ANION GAP: 8 (ref 5–15)
BUN: 42 mg/dL — ABNORMAL HIGH (ref 6–20)
CALCIUM: 10.1 mg/dL (ref 8.9–10.3)
CO2: 26 mmol/L (ref 22–32)
Chloride: 107 mmol/L (ref 101–111)
Creatinine, Ser: 1.54 mg/dL — ABNORMAL HIGH (ref 0.44–1.00)
GFR, EST AFRICAN AMERICAN: 35 mL/min — AB (ref >60)
GFR, EST NON AFRICAN AMERICAN: 30 mL/min — AB (ref >60)
GLUCOSE: 145 mg/dL — AB (ref 65–99)
POTASSIUM: 3.3 mmol/L — AB (ref 3.5–5.1)
SODIUM: 141 mmol/L (ref 135–145)

## 2015-05-22 MED ORDER — DEXTROMETHORPHAN POLISTIREX ER 30 MG/5ML PO SUER
30.0000 mg | Freq: Two times a day (BID) | ORAL | Status: DC
  Administered 2015-05-22 – 2015-05-24 (×4): 30 mg via ORAL
  Filled 2015-05-22 (×5): qty 5

## 2015-05-22 MED ORDER — FUROSEMIDE 10 MG/ML IJ SOLN
20.0000 mg | Freq: Two times a day (BID) | INTRAMUSCULAR | Status: DC
  Administered 2015-05-22 – 2015-05-23 (×4): 20 mg via INTRAVENOUS
  Filled 2015-05-22 (×6): qty 2

## 2015-05-22 NOTE — Progress Notes (Signed)
Unable to perform teaching due to patient's poor cognitive impiarement, no family present.

## 2015-05-22 NOTE — Progress Notes (Signed)
PROGRESS NOTE  [name] WGN:[id number] DOB: [birth year] DOA: 05/17/2015 PCP: Garlan Fillers, MD  HPI/Recap of past 60 hours: 81 year old with past mental history of aortic stenosis, mild dementia and obesity admitted on 9/16 for failure to thrive and found to have mild urinary tract infection. Chest x-ray on admission noted atelectasis versus possible pneumonia. Patient admitted to hospitalist service receiving antibiotics and IV fluids. This led to her becoming volume overloaded with Lasix being started 9/20.  Patient today somewhat interactive. States her breathing is rough.  Assessment/Plan: Principal Problem:   FTT (failure to thrive) in adult: Multifactorial in the setting of dementia and acute illness Active Problems:   MITRAL INSUFFICIENCY   Essential hypertension, benign   Aortic stenosis:   CAP (community acquired pneumonia): Continuing antibiotics. We'll check swallow eval to rule out aspiration.   AKI (acute kidney injury): 15 months ago, renal function normal. We do not have any records in between. Suspect she may have some mild underlying chronic kidney disease of stage I or 2. Elevated creatinine now in the setting of diuresing.    Obesity (BMI 30-39.9): Patient meets criteria with BMI greater than 30    Senile dementia without behavioral disturbance: Stable, continue Aricept  Acute diastolic heart failure: We'll repeat echocardiogram. We started IV Lasix  Code Status: Full code  Family Communication: Left message with daughter  Disposition Plan: Skilled nursing facility once patient fully diuresed   Consultants:  None  Procedures:  Echocardiogram ordered  Antibiotics:  IV Rocephin 9/17-present  Zithromax 9/17-present   Objective: BP 134/78 mmHg  Pulse 102  Temp(Src) 98.1 F (36.7 C) (Oral)  Resp 16  Ht  (1.651 m)  Wt 107.502 kg (237 lb)  BMI 39.44 kg/m2  SpO2 98%  Intake/Output Summary (Last 24 hours) at 05/22/15 1109 Last data  filed at 05/22/15 1009  Gross per 24 hour  Intake    760 ml  Output      0 ml  Net    760 ml   Filed Weights   05/21/15 1216  Weight: 107.502 kg (237 lb)    Exam:   General:  Alert and oriented 1, no acute distress  Cardiovascular: Regular rate and rhythm, S1-S2, 2/6 systolic ejection murmur  Respiratory: Bilateral rales  Abdomen: Soft, obese, nontender, positive bowel sounds  Musculoskeletal: No clubbing or cyanosis, trace edema   Data Reviewed: Basic Metabolic Panel:  Recent Labs Lab 05/18/15 0610 05/19/15 0524 05/20/15 0602 05/21/15 0714 05/22/15 0535  NA 141 139 141 142 141  K 3.5 3.4* 4.0 3.8 3.3*  CL 108 107 108 107 107  CO2 GLUCOSE 122* 177* 165* 156* 145*  BUN 34* 32* 31* 40* 42*  CREATININE 1.20* 1.10* 1.26* 1.34* 1.54*  CALCIUM 10.5* 9.6 10.1 10.2 10.1   Liver Function Tests:  Recent Labs Lab 05/17/15 1306  AST 36  ALT 15  ALKPHOS 63  BILITOT 2.1*  PROT 7.8  ALBUMIN 3.4*   No results for input(s): LIPASE, AMYLASE in the last 168 hours. No results for input(s): AMMONIA in the last 168 hours. CBC:  Recent Labs Lab 05/17/15 1306 05/18/15 0610 05/19/15 0524 05/20/15 0602 05/21/15 0714 05/22/15 0535  WBC 9.7 12.1* 8.8 8.4 9.3 10.9*  NEUTROABS 7.8*  --   --   --   --   --   HGB 12.6 13.0 12.1 13.0 12.6 11.5*  HCT 40.1 41.1 37.9 39.7 39.0 36.1  MCV 84.1 83.4 82.4 81.9 82.5  82.0  PLT 135* 152 170 203 215 242   Cardiac Enzymes:    Recent Labs Lab 05/17/15 1306  TROPONINI 0.08*   BNP (last 3 results) No results for input(s): BNP in the last 8760 hours.  ProBNP (last 3 results) No results for input(s): PROBNP in the last 8760 hours.  CBG: No results for input(s): GLUCAP in the last 168 hours.  Recent Results (from the past 240 hour(s))  Urine culture     Status: None   Collection Time: 05/17/15  6:23 PM  Result Value Ref Range Status   Specimen Description URINE, CATHETERIZED  Final   Special Requests NONE   Final   Culture   Final    NO GROWTH 2 DAYS Performed at [facility]    Report Status 05/19/2015 FINAL  Final     Studies: No results found.  Scheduled Meds: . aspirin EC  81 mg Oral Daily  . azithromycin  500 mg Oral QPM  . cefTRIAXone (ROCEPHIN)  IV  1 g Intravenous Q24H  . donepezil  10 mg Oral QHS  . furosemide  20 mg Intravenous BID  . heparin  5,000 Units Subcutaneous 3 times per day  . rosuvastatin  10 mg Oral Daily    Continuous Infusions:    Time spent:   Hollice Espy  [facility] Pager [phone]. If 7PM-7AM, please contact night-coverage at [email or web address], password [facility] 05/22/2015, 11:09 AM  LOS: 5 days

## 2015-05-22 NOTE — Progress Notes (Signed)
Physical Therapy Treatment Patient Details Name: [name] MRN: [id number] DOB: 03/23/1930 Today's Date: 05/22/2015    History of Present Illness 81 yo female admitted with FTT, Pna, AKI. Hx of DM, cognitive impairment, DM, HTN, CAD, aortic stenosis, CVA, 2* Mobitz, Alz.     PT Comments    Pt now with UNNA boots and PRAFOs.  Pt continues to report LE pain with movement however also appears anxious during mobility.  Pt sometimes resistant to assist as well.  Pt required increased time and effort for total assist +2 bed mobility.  Pt appears more at ease with resting end of session.   Follow Up Recommendations  SNF;Supervision/Assistance - 24 hour     Equipment Recommendations  None recommended by PT    Recommendations for Other Services       Precautions / Restrictions Precautions Precautions: Fall Restrictions Weight Bearing Restrictions: No    Mobility  Bed Mobility Overal bed mobility: Needs Assistance Bed Mobility: Supine to Sit;Sit to Supine     Supine to sit: Total assist;+2 for physical assistance Sit to supine: Total assist;+2 for physical assistance   General bed mobility comments: increased assist required for all bed mobility, pt agreeable however not initiating and when assisted reports pain in bil LEs and back today, eases with rest  Transfers Overall transfer level:  (NT due to safety)               General transfer comment: pt unable at this time, attempted to assist OOB with stedy however pt unable to assist so stedy not used and pt returned to supine  Ambulation/Gait                 Stairs            Wheelchair Mobility    Modified Rankin (Stroke Patients Only)       Balance Overall balance assessment: Needs assistance Sitting-balance support: Single extremity supported;Feet supported Sitting balance-Leahy Scale: Poor Sitting balance - Comments: propped onto R side (HOB slightly elevated), encouraged to sit upright  however pt unable to maintain position and would only perform for <30sec (HOB mostly in highly elevated position for pt to lean against) Postural control: Right lateral lean                          Cognition Arousal/Alertness: Awake/alert Behavior During Therapy: Anxious Overall Cognitive Status: No family/caregiver present to determine baseline cognitive functioning Area of Impairment: Orientation;Attention;Memory;Following commands;Safety/judgement;Problem solving Orientation Level: Place;Time;Situation Current Attention Level: Focused Memory: Decreased short-term memory Following Commands: Follows one step commands inconsistently Safety/Judgement: Decreased awareness of safety;Decreased awareness of deficits   Problem Solving: Slow processing;Decreased initiation;Requires verbal cues;Requires tactile cues General Comments: pt frequently states "wait, wait, wait" when attempting to assist with mobility, poor ability to verbalize/explain    Exercises      General Comments        Pertinent Vitals/Pain Pain Assessment: Faces Faces Pain Scale: Hurts whole lot Pain Location: bil LEs with movement,back Pain Descriptors / Indicators: Aching;Sore Pain Intervention(s): Monitored during session;Limited activity within patient's tolerance;Repositioned    Home Living                      Prior Function            PT Goals (current goals can now be found in the care plan section) Progress towards PT goals: Not progressing toward goals - comment (no improvement compared  to previous session)    Frequency  Min 3X/week    PT Plan Current plan remains appropriate    Co-evaluation             End of Session   Activity Tolerance: Patient limited by fatigue;Patient limited by pain Patient left: in bed;with call bell/phone within reach;with bed alarm set     Time: 1610-9604 PT Time Calculation (min) (ACUTE ONLY): 17 min  Charges:  $Therapeutic Activity:  8-22 mins                    G Codes:      LEMYRE,KATHrine E 06-11-15, 10:39 AM Zenovia Jarred, PT, DPT 06-11-2015 Pager: [phone]

## 2015-05-22 NOTE — Evaluation (Addendum)
Clinical/Bedside Swallow Evaluation Patient Details  Name: [name] MRN: [id number] Date of Birth: [birth year]  Today's Date: 05/22/2015 Time: SLP Start Time (ACUTE ONLY): 1250 SLP Stop Time (ACUTE ONLY): 1307 SLP Time Calculation (min) (ACUTE ONLY): 17 min  Past Medical History:  Past Medical History  Diagnosis Date  . DM (diabetes mellitus)   . Hyperlipidemia   . HTN (hypertension)   . CAD (coronary artery disease)     s/p NSTEMI 2003 with stent placement OM1.  PTCA with cutting balloon prox LAD. 2005 admission with chest pain, BMS placed to CFX and DES placed in RCA  . Aortic stenosis     moderate to severe  . Anemia   . CRI (chronic renal insufficiency)   . CVA (cerebral infarction)     remote; recovered  . Second degree Mobitz II AV block     s/p PPM 12/2009  . Hx of colonoscopy   . Headache 2003  . GERD (gastroesophageal reflux disease)   . Alzheimer's disease 07/2011   Past Surgical History:  Past Surgical History  Procedure Laterality Date  . Hysterectomy    . Benign vocal cord tumor removed    . Right knee surgery      x 3 with TKA  . Pacemaker insertion  01/10/2010    SJM Accent DR RF implanted by Dr Johney Frame   HPI:  81 yo female adm to [facility] with FTT, poor appetite, weakness, UTI, AKI, ? pna vs fluid overload.  Pt with PMH + for CVA, CAD, DM, ? right neck mass per imaging order, obesity.  Suspected pulmonary edema per CXR 9/19.  She has a h/o small hiatal hernia and slight reflux per work up in 09/10/2000.  Swallow evaluation ordered to rule out aspiration.     Assessment / Plan / Recommendation Clinical Impression  Pt currently is not managing her secretions well - concerning for secretion aspiration and demonstrates congested effortful breathing.  These two factors are largest barrier at this point for po intake.    In addition, pt noted to wince in pain with swallowing - pointing to mid and proximal esophagus - ? source of odynophagia.    CN exam  unremarkable but pt is edentulous and SLP did not locate dentures in room.  No family present to determine if pt uses dentures.  Very prolonged "mastication" of solids with delayed oral transiting noted.  Icecream, jello and liquids are more efficently transited.    Pt has h/o small hiatal hernia and slight reflux diagnosed 09/10/00 per imaging study.    Would recommend to consider npo except small single ice chip boluses and medicine with applesauce - followed by small amount of water pending improvement in secretion management/respiratory status.    Anticipate rapid swallow improvement with respiratory improvement given pt lack pf hospitalizations within six months and her appearance of adequate nutrition prior to admit.   SLP to follow up next date to assess for functional swallow improvement at bedside.     Aspiration Risk  Severe    Diet Recommendation NPO;Ice chips PRN after oral care (medicine with puree followed by water)   Medication Administration: Whole meds with puree Compensations: Slow rate;Small sips/bites    Other  Recommendations     Follow Up Recommendations       Frequency and Duration min 1 x/week  1 week   Pertinent Vitals/Pain Afebrile, congested breathing unable to clear with cued coughing      Swallow Study Prior Functional Status  unknown    General Date of Onset: 05/22/15 Other Pertinent Information: 81 yo female adm to [facility] with FTT, poor appetite, weakness, UTI, AKI, ? pna vs fluid overload.  Pt with PMH + for CVA, CAD, DM, right neck mass, obesity.  Suspected pulmonary edema per CXR 9/19.  She has a h/o small hiatal hernia and slight reflux per work up in 09/10/2000.  Swallow evaluation ordered to rule out aspiration.   Type of Study: Bedside swallow evaluation Diet Prior to this Study: Regular;Thin liquids Temperature Spikes Noted: No Respiratory Status: Room air History of Recent Intubation: No Behavior/Cognition: Alert;Cooperative;Confused Oral  Cavity - Dentition: Edentulous Self-Feeding Abilities: Total assist Patient Positioning: Upright in bed Baseline Vocal Quality: Low vocal intensity;Wet Volitional Cough: Weak Volitional Swallow: Unable to elicit    Oral/Motor/Sensory Function Overall Oral Motor/Sensory Function: Appears within functional limits for tasks assessed (no focal CN deficits with directions pt able to follow)   Ice Chips Ice chips: Not tested   Thin Liquid Thin Liquid: Impaired Oral Phase Impairments: Impaired anterior to posterior transit;Reduced lingual movement/coordination Oral Phase Functional Implications: Prolonged oral transit Pharyngeal  Phase Impairments: Cough - Delayed    Nectar Thick Nectar Thick Liquid: Not tested   Honey Thick Honey Thick Liquid: Not tested   Puree Puree: Impaired Oral Phase Impairments: Impaired anterior to posterior transit;Reduced lingual movement/coordination Oral Phase Functional Implications: Prolonged oral transit Pharyngeal Phase Impairments: Cough - Delayed   Solid   GO    Solid: Impaired Oral Phase Impairments: Impaired anterior to posterior transit;Reduced lingual movement/coordination;Impaired mastication Oral Phase Functional Implications: Other (comment) (prolonged oral transiting - over 70 seconds to "masticate" small bite of macaroni and cheese and transit)      Donavan Burnet, MS [facility] SLP [phone]     SLP spoke to RN at approx 1500 and advised her to findings of evaluation and recommendations.

## 2015-05-23 ENCOUNTER — Inpatient Hospital Stay (HOSPITAL_COMMUNITY): Payer: Medicare Other

## 2015-05-23 DIAGNOSIS — F0390 Unspecified dementia without behavioral disturbance: Secondary | ICD-10-CM

## 2015-05-23 DIAGNOSIS — R1312 Dysphagia, oropharyngeal phase: Secondary | ICD-10-CM

## 2015-05-23 DIAGNOSIS — R1314 Dysphagia, pharyngoesophageal phase: Secondary | ICD-10-CM | POA: Diagnosis present

## 2015-05-23 MED ORDER — GUAIFENESIN ER 600 MG PO TB12
600.0000 mg | ORAL_TABLET | Freq: Two times a day (BID) | ORAL | Status: DC
  Administered 2015-05-23 – 2015-05-24 (×2): 600 mg via ORAL
  Filled 2015-05-23 (×3): qty 1

## 2015-05-23 NOTE — Progress Notes (Signed)
Speech Language Pathology Treatment: Dysphagia  Patient Details Name: [name] MRN: [id number] DOB: 11/26/1929 Today's Date: 05/23/2015 Time: 4098-1191 SLP Time Calculation (min) (ACUTE ONLY): 22 min  Assessment / Plan / Recommendation Clinical Impression  F/u after yesterday's swallow evaluation.  Daughter present today, and reports her mother had a terrific appetite with no swallowing deficits PTA.  Today, pt is more alert and participatory, but continues to present with overt cough after all PO trials regardless of consistency.  Given hx of dementia, FTT and current respiratory status, it will be beneficial to proceed with MBS to determine status of oropharyngeal swallow and safest PO diet.  MD, dtr agree - will complete this pm.    HPI Other Pertinent Information: 81 yo female adm to [facility] with FTT, poor appetite, weakness, UTI, AKI, ? pna vs fluid overload.  Pt with PMH + for CVA, CAD, DM, right neck mass, obesity.  Suspected pulmonary edema per CXR 9/19.  She has a h/o small hiatal hernia and slight reflux per work up in 09/10/2000.  Swallow evaluation ordered to rule out aspiration.     Pertinent Vitals Pain Assessment: No/denies pain Faces Pain Scale: Hurts a little bit Pain Intervention(s): Monitored during session  SLP Plan  MBS    Recommendations Diet recommendations: NPO              Plan: MBS   Amanda L. Samson Frederic, [place] CCC/SLP Pager [phone]      Blenda Mounts Laurice 05/23/2015, 1:25 PM

## 2015-05-23 NOTE — Procedures (Signed)
  Objective Swallowing Evaluation:  (mbs)  Patient Details  Name: [name] MRN: [id number] Date of Birth: 05/29/1930  Today's Date: 05/23/2015 Time: SLP Start Time (ACUTE ONLY): 1400-SLP Stop Time (ACUTE ONLY): 1422 SLP Time Calculation (min) (ACUTE ONLY): 22 min  Past Medical History:  Past Medical History  Diagnosis Date  . DM (diabetes mellitus)   . Hyperlipidemia   . HTN (hypertension)   . CAD (coronary artery disease)     s/p NSTEMI 2003 with stent placement OM1.  PTCA with cutting balloon prox LAD. 2005 admission with chest pain, BMS placed to CFX and DES placed in RCA  . Aortic stenosis     moderate to severe  . Anemia   . CRI (chronic renal insufficiency)   . CVA (cerebral infarction)     remote; recovered  . Second degree Mobitz II AV block     s/p PPM 12/2009  . Hx of colonoscopy   . Headache 2003  . GERD (gastroesophageal reflux disease)   . Alzheimer's disease 07/2011   Past Surgical History:  Past Surgical History  Procedure Laterality Date  . Hysterectomy    . Benign vocal cord tumor removed    . Right knee surgery      x 3 with TKA  . Pacemaker insertion  01/10/2010    SJM Accent DR RF implanted by Dr Johney Frame   HPI:  Other Pertinent Information: 81 yo female adm to [facility] with FTT, poor appetite, weakness, UTI, AKI, ? pna vs fluid overload.  Pt with PMH + for CVA, CAD, DM, right neck mass, obesity.  Suspected pulmonary edema per CXR 9/19.  She has a h/o small hiatal hernia and slight reflux per work up in 09/10/2000.  Swallow evaluation ordered to rule out aspiration.    No Data Recorded  Assessment / Plan / Recommendation CHL IP CLINICAL IMPRESSIONS 05/23/2015  Therapy Diagnosis WFL  Clinical Impression Pt presents with quite functional oropharyngeal swallow - despite absence of teeth and prolonged mastication, she is able to handle solid boluses; there is rapid and thorough transfer of all materials through pharynx; there was one brief episode of high  penetration; otherwise, laryngeal function is normal for her age and there was no aspiration. Similar to bedside presentation, pt coughed intermittently during study, but this was not associated with penetration/aspiration.  Daughter present and viewed study in progress.  Recommend initiating a dysphagia 3 diet with chopped meats; all liquids; meds whole in puree (difficulty transfering barium pill).  Daughter agrees with recs.  No further SLP intervention warranted - we will sign off.       CHL IP TREATMENT RECOMMENDATION 05/23/2015  Treatment Recommendations No treatment recommended at this time     CHL IP DIET RECOMMENDATION 05/23/2015  SLP Diet Recommendations Dysphagia 3 (Mech soft);Thin - chopped meats  Liquid Administration via (None)  Medication Administration Whole meds with puree  Compensations (None)  Postural Changes and/or Swallow Maneuvers (None)     CHL IP OTHER RECOMMENDATIONS 05/23/2015  Recommended Consults (None)  Oral Care Recommendations Oral care BID  Other Recommendations (None)              Ane Conerly L. Samson Frederic, [place] CCC/SLP Pager [phone]    Blenda Mounts Laurice 05/23/2015, 2:40 PM

## 2015-05-23 NOTE — Care Management Important Message (Signed)
Important Message  Patient Details  Name: [name] MRN: [id number] Date of Birth: 19-Jan-1930   Medicare Important Message Given:  Yes-third notification given    Haskell Flirt 05/23/2015, 11:32 AMImportant Message  Patient Details  Name: [name] MRN: [id number] Date of Birth: 09/12/1929   Medicare Important Message Given:  Yes-third notification given    Haskell Flirt 05/23/2015, 11:32 AM

## 2015-05-23 NOTE — Progress Notes (Signed)
PROGRESS NOTE  REEGAN [name] YQM:[id number] DOB: 1930-08-16 DOA: 05/17/2015 PCP: Garlan Fillers, MD  HPI/Recap of past 102 hours: 81 year old with past mental history of aortic stenosis, mild dementia and obesity admitted on 9/16 for failure to thrive and found to have mild urinary tract infection. Chest x-ray on admission noted atelectasis versus possible pneumonia. Patient admitted to hospitalist service receiving antibiotics and IV fluids. This led to her becoming volume overloaded with Lasix being started 9/20.  Patient is diuresed some, but still looked to be lethargic. Concerns about swallowing. She underwent an initial swelling evaluation on 9/21 which she filled out right. Patient made nothing by mouth and today, much more alert. She underwent modified barium swallow which noted that despite not having her teeth, was able to tolerate some swallowing and she's put on a dysphagia 3 diet with thin liquids.  Patient today more alert and interactive. No acute distress, no complaints.  Assessment/Plan: Principal Problem:   FTT (failure to thrive) in adult: Multifactorial in the setting of dementia and acute illness Active Problems:   MITRAL INSUFFICIENCY   Essential hypertension, benign   Aortic stenosis:   CAP (community acquired pneumonia): Continuing antibiotics. Despite dysphagia, suspect this is more community-acquired and that combination of infection plus her dementia put her at increased risk for aspiration in the future. We'll finish antibiotics tomorrow.  Dysphagia: As outlined above, downgraded to dysphagia 3 diet with thin liquids. Will add Mucinex for expectoration    AKI (acute kidney injury): 15 months ago, renal function normal. We do not have any records in between. Suspect she may have some mild underlying chronic kidney disease of stage I or 2. Elevated creatinine now in the setting of diuresing.    Obesity (BMI 30-39.9): Patient meets criteria with BMI greater than  30    Senile dementia without behavioral disturbance: Stable, continue Aricept.    Acute diastolic heart failure: Echocardiogram today notes grade 1 diastolic dysfunction. Started on IV Lasix and has been difficult to track output due to incontinence, but weight is down. Creatinine trending up so will stop Lasix and return back to home dose  Code Status: Full code  Family Communication: Spoke with daughter at the bedside  Disposition Plan: Anticipate discharge tomorrow or next day to home   Consultants:  None  Procedures:  Echocardiogram ordered  Antibiotics:  IV Rocephin 9/17-present  Zithromax 9/17-present   Objective: BP 113/65 mmHg  Pulse 103  Temp(Src) 98.4 F (36.9 C) (Oral)  Resp 16  Ht  (1.651 m)  Wt 102.4 kg (225 lb 12 oz)  BMI 37.57 kg/m2  SpO2 99% No intake or output data in the 24 hours ending 05/23/15 1839 Filed Weights   05/21/15 1216 05/23/15 0500  Weight: 107.502 kg (237 lb) 102.4 kg (225 lb 12 oz)    Exam:   General:  Alert and oriented 1, no acute distress  Cardiovascular: Regular rate and rhythm, S1-S2, 2/6 systolic ejection murmur  Respiratory: Mostly clear   Abdomen: Soft, obese, nontender, positive bowel sounds  Musculoskeletal: No clubbing or cyanosis, trace edema   Data Reviewed: Basic Metabolic Panel:  Recent Labs Lab 05/18/15 0610 05/19/15 0524 05/20/15 0602 05/21/15 0714 05/22/15 0535  NA 141 139 141 142 141  K 3.5 3.4* 4.0 3.8 3.3*  CL 108 107 108 107 107  CO2 GLUCOSE 122* 177* 165* 156* 145*  BUN 34* 32* 31* 40* 42*  CREATININE 1.20* 1.10* 1.26* 1.34* 1.54*  CALCIUM  10.5* 9.6 10.1 10.2 10.1   Liver Function Tests:  Recent Labs Lab 05/17/15 1306  AST 36  ALT 15  ALKPHOS 63  BILITOT 2.1*  PROT 7.8  ALBUMIN 3.4*   No results for input(s): LIPASE, AMYLASE in the last 168 hours. No results for input(s): AMMONIA in the last 168 hours. CBC:  Recent Labs Lab 05/17/15 1306  05/18/15 0610 05/19/15 0524 05/20/15 0602 05/21/15 0714 05/22/15 0535  WBC 9.7 12.1* 8.8 8.4 9.3 10.9*  NEUTROABS 7.8*  --   --   --   --   --   HGB 12.6 13.0 12.1 13.0 12.6 11.5*  HCT 40.1 41.1 37.9 39.7 39.0 36.1  MCV 84.1 83.4 82.4 81.9 82.5 82.0  PLT 135* 152 170 203 215 242   Cardiac Enzymes:    Recent Labs Lab 05/17/15 1306  TROPONINI 0.08*   BNP (last 3 results) No results for input(s): BNP in the last 8760 hours.  ProBNP (last 3 results) No results for input(s): PROBNP in the last 8760 hours.  CBG: No results for input(s): GLUCAP in the last 168 hours.  Recent Results (from the past 240 hour(s))  Urine culture     Status: None   Collection Time: 05/17/15  6:23 PM  Result Value Ref Range Status   Specimen Description URINE, CATHETERIZED  Final   Special Requests NONE  Final   Culture   Final    NO GROWTH 2 DAYS Performed at [facility]    Report Status 05/19/2015 FINAL  Final     Studies: Dg Chest Port 1 View  05/23/2015   CLINICAL DATA:  Cough  EXAM: PORTABLE CHEST 1 VIEW  COMPARISON:  05/20/2015  FINDINGS: Cardiomediastinal silhouette is stable. Mild interstitial prominence bilaterally probable mild edema again noted. Hazy right basilar atelectasis or infiltrate. Dual lead cardiac pacemaker is unchanged in position.  IMPRESSION: Again noted mild interstitial prominence bilateral probable mild edema. Hazy right basilar atelectasis or infiltrate.   Electronically Signed   By: Natasha Mead M.D.   On: 05/23/2015 11:20    Scheduled Meds: . aspirin EC  81 mg Oral Daily  . azithromycin  500 mg Oral QPM  . cefTRIAXone (ROCEPHIN)  IV  1 g Intravenous Q24H  . dextromethorphan  30 mg Oral BID  . donepezil  10 mg Oral QHS  . heparin  5,000 Units Subcutaneous 3 times per day  . rosuvastatin  10 mg Oral Daily    Continuous Infusions:    Time spent: 25 minutes  Hollice Espy  [facility] Pager [phone]. If 7PM-7AM, please contact  night-coverage at [email or web address], password [facility] 05/23/2015, 6:39 PM  LOS: 6 days

## 2015-05-23 NOTE — Progress Notes (Signed)
Occupational Therapy Treatment Patient Details Name: [name] MRN: [id number] DOB: 12/28/1929 Today's Date: 05/23/2015    History of present illness 81 yo female admitted with FTT, Pna, AKI. Hx of DM, cognitive impairment, DM, HTN, CAD, aortic stenosis, CVA, 2* Mobitz, Alz.    OT comments  Pt not progressing well towards acute OT goals. Pt agreeable to therapy session. Focus of session was grooming tasks and BUE strengthening as detailed below. D/c plan remains appropriate.   Follow Up Recommendations  Supervision/Assistance - 24 hour;SNF    Equipment Recommendations  Other (comment) (defer to next venue)    Recommendations for Other Services      Precautions / Restrictions Precautions Precautions: Fall Restrictions Weight Bearing Restrictions: No       Mobility Bed Mobility                  Transfers                      Balance                                   ADL Overall ADL's : Needs assistance/impaired     Grooming: Wash/dry face;Brushing hair;Wash/dry hands;Moderate assistance;Bed level (applying lotion) Grooming Details (indicate cue type and reason): Pt able to comb hair minimaily after therapist inititing with HOH assist. Pt needing min guard to min A to hold UE inplace (therapist hand under elbow) due to weakness. Pt was able to briefly comb hair with min guard assist. Pt with inconsistent 1 step command following with multimodal cues provided.                                General ADL Comments: Pt completed grooming tasks at bed level to promote BUE strengthening and participationin ADLs. Pt also completed BUE AROM exercises as detailed below. Issued therapy squeeze ball for promoting grip strength with pt giving return demonstration.       Vision                     Perception     Praxis      Cognition   Behavior During Therapy: [facility] for tasks assessed/performed Overall Cognitive Status:  History of cognitive impairments - at baseline                       Extremity/Trunk Assessment               Exercises General Exercises - Upper Extremity Shoulder Flexion: AROM;Both;10 reps Elbow Flexion: AROM;5 reps;Both Elbow Extension: AROM;Both;5 reps   Shoulder Instructions       General Comments      Pertinent Vitals/ Pain       Pain Assessment: No/denies pain Faces Pain Scale: Hurts a little bit Pain Intervention(s): Monitored during session  Home Living                                          Prior Functioning/Environment              Frequency Min 2X/week     Progress Toward Goals  OT Goals(current goals can now be found in the care plan section)  Progress towards OT goals:  Not progressing toward goals - comment  Acute Rehab OT Goals Patient Stated Goal: none stated; agreeable to participation OT Goal Formulation: With patient Time For Goal Achievement: 05/25/15 Potential to Achieve Goals: Good ADL Goals Pt Will Perform Lower Body Bathing: with min assist;sit to/from stand Pt Will Perform Lower Body Dressing: with mod assist;sit to/from stand Pt Will Transfer to Toilet: with min assist;bedside commode;stand pivot transfer Pt Will Perform Toileting - Clothing Manipulation and hygiene: with max assist;sit to/from stand Additional ADL Goal #1: pt will perform bed mobility with min A in preparation for transfer to 3:1 Additional ADL Goal #2: Pt will perform HEP including BUE exercise program to increase I with ADL activity.  Plan Discharge plan remains appropriate    Co-evaluation                 End of Session     Activity Tolerance Patient limited by fatigue   Patient Left in bed;with call bell/phone within reach   Nurse Communication          Time: 1610-9604 OT Time Calculation (min): 21 min  Charges: OT General Charges $OT Visit: 1 Procedure OT Treatments $Self Care/Home Management : 8-22  mins  Pilar Grammes 05/23/2015, 10:27 AM

## 2015-05-24 DIAGNOSIS — J690 Pneumonitis due to inhalation of food and vomit: Secondary | ICD-10-CM

## 2015-05-24 DIAGNOSIS — E860 Dehydration: Secondary | ICD-10-CM | POA: Insufficient documentation

## 2015-05-24 LAB — BASIC METABOLIC PANEL WITH GFR
Anion gap: 8 (ref 5–15)
BUN: 39 mg/dL — ABNORMAL HIGH (ref 6–20)
CO2: 28 mmol/L (ref 22–32)
Calcium: 9.7 mg/dL (ref 8.9–10.3)
Chloride: 108 mmol/L (ref 101–111)
Creatinine, Ser: 1.34 mg/dL — ABNORMAL HIGH (ref 0.44–1.00)
GFR calc Af Amer: 41 mL/min — ABNORMAL LOW
GFR calc non Af Amer: 36 mL/min — ABNORMAL LOW
Glucose, Bld: 138 mg/dL — ABNORMAL HIGH (ref 65–99)
Potassium: 3 mmol/L — ABNORMAL LOW (ref 3.5–5.1)
Sodium: 144 mmol/L (ref 135–145)

## 2015-05-24 LAB — CBC
HCT: 35.5 % — ABNORMAL LOW (ref 36.0–46.0)
Hemoglobin: 11.3 g/dL — ABNORMAL LOW (ref 12.0–15.0)
MCH: 25.9 pg — ABNORMAL LOW (ref 26.0–34.0)
MCHC: 31.8 g/dL (ref 30.0–36.0)
MCV: 81.4 fL (ref 78.0–100.0)
Platelets: 286 K/uL (ref 150–400)
RBC: 4.36 MIL/uL (ref 3.87–5.11)
RDW: 14.8 % (ref 11.5–15.5)
WBC: 10.2 K/uL (ref 4.0–10.5)

## 2015-05-24 LAB — MAGNESIUM: Magnesium: 1.8 mg/dL (ref 1.7–2.4)

## 2015-05-24 MED ORDER — POTASSIUM CHLORIDE CRYS ER 20 MEQ PO TBCR
40.0000 meq | EXTENDED_RELEASE_TABLET | ORAL | Status: DC
  Administered 2015-05-24: 40 meq via ORAL
  Filled 2015-05-24: qty 2

## 2015-05-24 MED ORDER — DEXTROMETHORPHAN POLISTIREX ER 30 MG/5ML PO SUER: 30.0000 mg | Freq: Two times a day (BID) | ORAL | Status: DC

## 2015-05-24 MED ORDER — MAGNESIUM SULFATE 2 GM/50ML IV SOLN
2.0000 g | Freq: Once | INTRAVENOUS | Status: DC
  Filled 2015-05-24: qty 50

## 2015-05-24 MED ORDER — MAGNESIUM OXIDE 400 (241.3 MG) MG PO TABS
800.0000 mg | ORAL_TABLET | Freq: Once | ORAL | Status: AC
  Administered 2015-05-24: 800 mg via ORAL
  Filled 2015-05-24: qty 2

## 2015-05-24 MED ORDER — GUAIFENESIN ER 600 MG PO TB12: 600.0000 mg | ORAL_TABLET | Freq: Two times a day (BID) | ORAL | Status: DC

## 2015-05-24 MED ORDER — POTASSIUM CHLORIDE ER 10 MEQ PO TBCR: 10.0000 meq | EXTENDED_RELEASE_TABLET | Freq: Three times a day (TID) | ORAL | Status: DC

## 2015-05-24 NOTE — Progress Notes (Signed)
Initial Nutrition Assessment  DOCUMENTATION CODES:   Morbid obesity  INTERVENTION:  None at this time   NUTRITION DIAGNOSIS:   Inadequate oral intake related to poor appetite, lethargy/confusion as evidenced by per patient/family report.    GOAL:   Patient will meet greater than or equal to 90% of their needs    MONITOR:   PO intake, Skin, I & O's, Labs  REASON FOR ASSESSMENT:   Low Braden    ASSESSMENT:   81 yo female with PMH of cognitive impairment, HTN, T2DM, dyslipidemia, CAD, aortic stenosis, brought into ED by family for increasing generalized weakness. Pt could not recall history during time of visit. Found to have creatinine of 1.6, presenting AKI when admitted. Pt exhibits no signs of malnutrition, and is severely obese. Pt weight appears to fluctuate. Family reports pt has had decreased po intake and increase cough with sputum. Follow PO intake.    Diet Order:  DIET DYS 3 Room service appropriate?: Yes; Fluid consistency:: Thin Diet - low sodium heart healthy  Skin:  Wound (see comment) (Unna boot left leg wound.)  Last BM:  9/23  Height:   Ht Readings from Last 1 Encounters:  05/21/15  (1.651 m)    Weight:   Wt Readings from Last 1 Encounters:  05/24/15 233 lb 14.5 oz (106.1 kg)    Ideal Body Weight:  56.81 kg  BMI:  Body mass index is 38.92 kg/(m^2).  Estimated Nutritional Needs:   Kcal:  1800-2000  Protein:  70-80 grams  Fluid:  >=/ 1.8L  EDUCATION NEEDS:   No education needs identified at this time  Dionne Ano. Layson Bertsch, MS, RD LDN After Hours/Weekend Pager [phone]

## 2015-05-24 NOTE — Discharge Summary (Signed)
Physician Discharge Summary  [name] MRN: [id number] DOB/AGE: 1930/08/15 81 y.o.  PCP: Donnajean Lopes, MD   Admit date: 05/17/2015 Discharge date: 05/24/2015  Discharge Diagnoses:     Principal Problem:   FTT (failure to thrive) in adult Active Problems:   MITRAL INSUFFICIENCY   Essential hypertension, benign   Aortic stenosis   CAP (community acquired pneumonia)   AKI (acute kidney injury)   Obesity (BMI 30-39.9)   Senile dementia without behavioral disturbance   Dysphagia, pharyngoesophageal phase    Follow-up recommendations Follow-up with PCP in 3-5 days , including all  additional recommended appointments as below Follow-up CBC, CMP in 3-5 days  Speech therapy recommendations  Dysphagia 3 (Mech soft);Thin - chopped meats   Liquid Administration via (None)  Medication Administration Whole meds with puree              Medication List    STOP taking these medications        metoprolol succinate 50 MG 24 hr tablet  Commonly known as:  TOPROL-XL     nitroGLYCERIN 0.4 MG SL tablet  Commonly known as:  NITROSTAT      TAKE these medications        aspirin EC 81 MG tablet  Take 81 mg by mouth daily.     dextromethorphan 30 MG/5ML liquid  Commonly known as:  DELSYM  Take 5 mLs (30 mg total) by mouth 2 (two) times daily.     diclofenac sodium 1 % Gel  Commonly known as:  VOLTAREN  Apply 2 g topically 4 (four) times daily as needed (pain).     donepezil 10 MG disintegrating tablet  Commonly known as:  ARICEPT ODT  Take 10 mg by mouth at bedtime.     furosemide 20 MG tablet  Commonly known as:  LASIX  Take 0.5 tablets (10 mg total) by mouth daily.     guaiFENesin 600 MG 12 hr tablet  Commonly known as:  MUCINEX  Take 1 tablet (600 mg total) by mouth 2 (two) times daily.     potassium chloride 10 MEQ tablet  Commonly known as:  K-DUR  Take 1 tablet (10 mEq total) by mouth 3 (three) times daily.     rosuvastatin 10 MG tablet   Commonly known as:  CRESTOR  Take 1 tablet (10 mg total) by mouth daily.         Discharge Condition:     Disposition: SNF   Consults: * None    Significant Diagnostic Studies:  Dg Chest 2 View  05/17/2015   CLINICAL DATA:  Decreased PO intake over the past few days. Hypertension.  EXAM: CHEST  2 VIEW  COMPARISON:  11/23/2013  FINDINGS: Left-sided pacemaker unchanged. Lungs are hypoinflated with subtle right base opacification which may be due to atelectasis or infection. Mild stable cardiomegaly. Remainder the exam is unchanged.  IMPRESSION: Subtle right base opacification which may be due to atelectasis or infection.  Stable mild cardiomegaly.   Electronically Signed   By: Marin Olp M.D.   On: 05/17/2015 14:15   Dg Chest Port 1 View  05/23/2015   CLINICAL DATA:  Cough  EXAM: PORTABLE CHEST 1 VIEW  COMPARISON:  05/20/2015  FINDINGS: Cardiomediastinal silhouette is stable. Mild interstitial prominence bilaterally probable mild edema again noted. Hazy right basilar atelectasis or infiltrate. Dual lead cardiac pacemaker is unchanged in position.  IMPRESSION: Again noted mild interstitial prominence bilateral probable mild edema. Hazy right basilar atelectasis or infiltrate.  Electronically Signed   By: Lahoma Crocker M.D.   On: 05/23/2015 11:20   Dg Chest Port 1 View  05/20/2015   CLINICAL DATA:  Cough, history of diabetes hypertension coronary artery disease, former smoker  EXAM: PORTABLE CHEST - 1 VIEW  COMPARISON:  05/18/2015  FINDINGS: Cardiac pacer in unchanged position. Stable mild to moderate cardiac enlargement with uncoiling of the aorta. There is peribronchial cuffing bilaterally. There is moderate diffuse interstitial prominence, increased when compared to prior study. Tiny right pleural effusion.  IMPRESSION: Suspect interstitial pulmonary edema related to congestive heart failure.   Electronically Signed   By: Skipper Cliche M.D.   On: 05/20/2015 07:31   Portable Chest 1  View  05/18/2015   CLINICAL DATA:  Community acquired pneumonia.  Follow-up.  EXAM: PORTABLE CHEST - 1 VIEW  COMPARISON:  05/17/2015.  11/23/2013.  FINDINGS: Dual lead pacemaker inserted from a left subclavian approach is unchanged with leads in the right atrium and right ventricle. Atrial lead projection is somewhat different, but I believe this is positional. Heart size is at the upper limits of normal. There is atherosclerosis of the aorta. There is venous hypertension possibly with early interstitial edema. There is mild volume loss at the right base that could be atelectasis or minimal pneumonia. No consolidation or lobar collapse. No visible effusion. Bony structures unremarkable.  IMPRESSION: Suspicion of venous hypertension with early interstitial edema.  Mild volume loss at the right base could be atelectasis or mild atelectatic pneumonia. No dense consolidation or lobar collapse.   Electronically Signed   By: Nelson Chimes M.D.   On: 05/18/2015 08:52       Filed Weights   05/21/15 1216 05/23/15 0500 05/24/15 0603  Weight: 107.502 kg (237 lb) 102.4 kg (225 lb 12 oz) 106.1 kg (233 lb 14.5 oz)     Microbiology: Recent Results (from the past 240 hour(s))  Urine culture     Status: None   Collection Time: 05/17/15  6:23 PM  Result Value Ref Range Status   Specimen Description URINE, CATHETERIZED  Final   Special Requests NONE  Final   Culture   Final    NO GROWTH 2 DAYS Performed at [facility]    Report Status 05/19/2015 FINAL  Final       Blood Culture    Component Value Date/Time   SDES URINE, CATHETERIZED 05/17/2015 1823   SPECREQUEST NONE 05/17/2015 1823   CULT  05/17/2015 1823    NO GROWTH 2 DAYS Performed at [facility]    REPTSTATUS 05/19/2015 FINAL 05/17/2015 1823      Labs: Results for orders placed or performed during the hospital encounter of 05/17/15 (from the past 48 hour(s))  Basic metabolic panel     Status: Abnormal   Collection Time:  05/24/15  4:28 AM  Result Value Ref Range   Sodium 144 135 - 145 mmol/L   Potassium 3.0 (L) 3.5 - 5.1 mmol/L   Chloride 108 101 - 111 mmol/L   CO2 28 22 - 32 mmol/L   Glucose, Bld 138 (H) 65 - 99 mg/dL   BUN 39 (H) 6 - 20 mg/dL   Creatinine, Ser 1.34 (H) 0.44 - 1.00 mg/dL   Calcium 9.7 8.9 - 10.3 mg/dL   GFR calc non Af Amer 36 (L) >60 mL/min   GFR calc Af Amer 41 (L) >60 mL/min    Comment: (NOTE) The eGFR has been calculated using the CKD EPI equation. This calculation has not  been validated in all clinical situations. eGFR's persistently <60 mL/min signify possible Chronic Kidney Disease.    Anion gap 8 5 - 15  CBC     Status: Abnormal   Collection Time: 05/24/15  4:28 AM  Result Value Ref Range   WBC 10.2 4.0 - 10.5 K/uL   RBC 4.36 3.87 - 5.11 MIL/uL   Hemoglobin 11.3 (L) 12.0 - 15.0 g/dL   HCT 35.5 (L) 36.0 - 46.0 %   MCV 81.4 78.0 - 100.0 fL   MCH 25.9 (L) 26.0 - 34.0 pg   MCHC 31.8 30.0 - 36.0 g/dL   RDW 14.8 11.5 - 15.5 %   Platelets 286 150 - 400 K/uL     Lipid Panel     Component Value Date/Time   CHOL 194 09/19/2012 1025   TRIG 97.0 09/19/2012 1025   HDL 67.40 09/19/2012 1025   CHOLHDL 3 09/19/2012 1025   VLDL 19.4 09/19/2012 1025   LDLCALC 107* 09/19/2012 1025   LDLDIRECT 130.0 11/04/2007 1131     Lab Results  Component Value Date   HGBA1C 6.0* 11/23/2013   HGBA1C * 12/12/2009    6.7 (NOTE)                                                                       According to the ADA Clinical Practice Recommendations for 2011, when HbA1c is used as a screening test:   >=6.5%   Diagnostic of Diabetes Mellitus           (if abnormal result  is confirmed)  5.7-6.4%   Increased risk of developing Diabetes Mellitus  References:Diagnosis and Classification of Diabetes Mellitus,Diabetes IEPP,2951,88(CZYSA 1):S62-S69 and Standards of Medical Care in         Diabetes - 2011,Diabetes Care,2011,34  (Suppl 1):S11-S61.   HGBA1C * 07/08/2009    6.2 (NOTE) The ADA  recommends the following therapeutic goal for glycemic control related to Hgb A1c measurement: Goal of therapy: <6.5 Hgb A1c  Reference: American Diabetes Association: Clinical Practice Recommendations 2010, Diabetes Care, 2010, 33: (Suppl  1).     Lab Results  Component Value Date   LDLCALC 107* 09/19/2012   CREATININE 1.34* 05/24/2015    81 year old with past mental history of aortic stenosis, mild dementia and obesity admitted on 9/16 for failure to thrive and found to have mild urinary tract infection. Chest x-ray on admission noted atelectasis versus possible pneumonia. Patient admitted to hospitalist service receiving antibiotics and IV fluids. This led to her becoming volume overloaded with Lasix being started 9/20.  Concerns about swallowing. She underwent an initial swelling evaluation on 9/21 which she filled out right. Patient made nothing by mouth and today, much more alert. She underwent modified barium swallow which noted that despite not having her teeth, was able to tolerate some swallowing and she's put on a dysphagia 3 diet with thin liquids.  Patient today more alert and interactive. No acute distress, no complaints.  Assessment/Plan:    FTT (failure to thrive) in adult: Multifactorial in the setting of dementia and acute illness    MITRAL INSUFFICIENCY  Essential hypertension, benign  Aortic stenosis:   CAP (community acquired pneumonia): completed  Antibiotics azithro plus rocephin for 7 days  . Despite  dysphagia, suspect this is more community-acquired and that combination of infection plus her dementia put her at increased risk for aspiration in the future.    Dysphagia: As outlined above, downgraded to dysphagia 3 diet with thin liquids. cont Mucinex for expectoration   AKI (acute kidney injury): 15 months ago, renal function normal. We do not have any records in between. Suspect she may have some mild underlying chronic kidney disease of stage I or 2.  Elevated creatinine now in the setting of diuresing.   Obesity (BMI 30-39.9): Patient meets criteria with BMI greater than 30   Senile dementia without behavioral disturbance: Stable, continue Aricept.   Acute diastolic heart failure: Echocardiogram today notes grade 1 diastolic dysfunction. Started on IV Lasix and has been difficult to track output due to incontinence, but weight is down. Creatinine stable, resume home dose of lasix  Hypokalemia-replete and recheck in 3-5 days       Discharge Exam: *   Blood pressure 105/60, pulse 90, temperature 97.8 F (36.6 C), temperature source Oral, resp. rate 16, height '5\' 5"'  (1.651 m), weight 106.1 kg (233 lb 14.5 oz), SpO2 96 %.   General: Alert and oriented 1, no acute distress  Cardiovascular: Regular rate and rhythm, M0-H6, 2/6 systolic ejection murmur  Respiratory: Mostly clear   Abdomen: Soft, obese, nontender, positive bowel sounds  Musculoskeletal: No clubbing or cyanosis, trace edema       Discharge Instructions    Diet - low sodium heart healthy    Complete by:  As directed      Increase activity slowly    Complete by:  As directed            Follow-up Information    Follow up with Donnajean Lopes, MD. Schedule an appointment as soon as possible for a visit in 1 week.   Specialty:  Internal Medicine   Contact information:   [place] [address] [phone]       Signed: Reyne [name] 05/24/2015, 10:08 AM        Time spent >45 mins

## 2015-05-24 NOTE — Progress Notes (Signed)
Pt's vitals were wnl and was tolerating diet. Iatan and gave report to [place]. Answered all questions and concerns. Discharged with [facility] to transport pt to [facility].

## 2015-05-24 NOTE — Progress Notes (Signed)
Pt for discharge to [facility].   CSW facilitated pt discharge needs including contacting facility, faxing pt discharge information via [facility], discussing with pt daughter, [name] via telephone, providing RN phone number to call report, and arranging ambulance transport for pt to [facility].   No further social work needs identified at this time.  CSW signing off.   Loletta Specter, MSW, LCSW Clinical Social Work [phone]

## 2015-05-24 NOTE — Progress Notes (Signed)
Physical Therapy Treatment Patient Details Name: [name] MRN: [id number] DOB: 12/02/1929 Today's Date: 05/24/2015    History of Present Illness 81 yo female admitted with FTT, Pna, UTI,AKI. Hx of DM, cognitive impairment, DM, HTN, CAD, aortic stenosis, CVA, 2* Mobitz, Alz.     PT Comments    Very limited participation from Pt. +2 total assist for supine to sit. She sat on EOB x 8 minutes. Doesn't follow commands.   Follow Up Recommendations  SNF;Supervision/Assistance - 24 hour     Equipment Recommendations  None recommended by PT    Recommendations for Other Services       Precautions / Restrictions Precautions Precautions: Fall Restrictions Weight Bearing Restrictions: No    Mobility  Bed Mobility Overal bed mobility: Needs Assistance Bed Mobility: Supine to Sit;Sit to Supine     Supine to sit: Total assist;+2 for physical assistance Sit to supine: Total assist;+2 for physical assistance   General bed mobility comments: increased assist required for all bed mobility, pt agreeable however not initiating and when assisted reports pain in bil LEs and back today, eases with rest  Transfers Overall transfer level:  (NT due to safety)               General transfer comment: pt unable at this time  Ambulation/Gait                 Stairs            Wheelchair Mobility    Modified Rankin (Stroke Patients Only)       Balance   Sitting-balance support: Feet supported;Single extremity supported Sitting balance-Leahy Scale: Poor Sitting balance - Comments: sat on EOB x 8 minutes, initially required mod assist for L lateral lean, then able to maintain neutral with B feet supported                            Cognition Arousal/Alertness: Awake/alert Behavior During Therapy: WFL for tasks assessed/performed Overall Cognitive Status: History of cognitive impairments - at baseline Area of Impairment:  Orientation;Attention;Memory;Following commands;Safety/judgement;Problem solving Orientation Level: Place;Time;Situation Current Attention Level: Focused Memory: Decreased short-term memory Following Commands: Follows one step commands inconsistently Safety/Judgement: Decreased awareness of safety;Decreased awareness of deficits   Problem Solving: Slow processing;Decreased initiation;Requires verbal cues;Requires tactile cues General Comments: pt frequently states "wait, wait, wait" when attempting to assist with mobility, poor ability to verbalize/explain, doesn't follow commands    Exercises General Exercises - Lower Extremity Long Arc Quad: PROM;Both;5 reps;Seated (no active participation despite verbal/manual cues)    General Comments        Pertinent Vitals/Pain Faces Pain Scale: Hurts even more Pain Location: BLEs with movement Pain Intervention(s): Monitored during session;Limited activity within patient's tolerance;Repositioned    Home Living                      Prior Function            PT Goals (current goals can now be found in the care plan section) Acute Rehab PT Goals Patient Stated Goal: none stated; agreeable to participation PT Goal Formulation: Patient unable to participate in goal setting Time For Goal Achievement: 06/01/15 Potential to Achieve Goals: Fair Progress towards PT goals: Not progressing toward goals - comment (no improvement compared to last visit, cognitive status limits participation)    Frequency  Min 3X/week    PT Plan Current plan remains appropriate    Co-evaluation  End of Session   Activity Tolerance: Patient limited by fatigue;Patient limited by pain Patient left: in bed;with call bell/phone within reach;with bed alarm set     Time: 1610-9604 PT Time Calculation (min) (ACUTE ONLY): 17 min  Charges:  $Therapeutic Activity: 8-22 mins                    G Codes:      Tamala Ser 05/24/2015, 1:32 PM [phone]

## 2015-05-24 NOTE — Clinical Social Work Placement (Signed)
   CLINICAL SOCIAL WORK PLACEMENT  NOTE  Date:  05/24/2015  Patient Details  Name: [name] MRN: [id number] Date of Birth: 12/30/1929  Clinical Social Work is seeking post-discharge placement for this patient at the Skilled  Nursing Facility level of care (*CSW will initial, date and re-position this form in  chart as items are completed):  Yes   Patient/family provided with [place] Clinical Social Work Department's list of facilities offering this level of care within the geographic area requested by the patient (or if unable, by the patient's family).  Yes   Patient/family informed of their freedom to choose among providers that offer the needed level of care, that participate in Medicare, Medicaid or managed care program needed by the patient, have an available bed and are willing to accept the patient.  Yes   Patient/family informed of [place]'s ownership interest in [facility] and [facility], as well as of the fact that they are under no obligation to receive care at these facilities.  PASRR submitted to EDS on 05/20/15     PASRR number received on 05/20/15     Existing PASRR number confirmed on       FL2 transmitted to all facilities in geographic area requested by pt/family on 05/20/15     FL2 transmitted to all facilities within larger geographic area on       Patient informed that his/her managed care company has contracts with or will negotiate with certain facilities, including the following:        Yes   Patient/family informed of bed offers received.  Patient chooses bed at [facility]     Physician recommends and patient chooses bed at      Patient to be transferred to [facility] on 05/24/15.  Patient to be transferred to facility by ambulance Sharin Mons)     Patient family notified on 05/24/15 of transfer.  Name of family member notified:  pt daughter, [name] notified via telephone     PHYSICIAN Please sign FL2     Additional  Comment:    _______________________________________________ Orson Eva, LCSW 05/24/2015, 3:01 PM

## 2015-05-24 NOTE — Discharge Instructions (Signed)
SLP Diet Recommendations Dysphagia 3 (Mech soft);Thin - chopped meats  Liquid Administration via (None)  Medication Administration Whole meds with puree

## 2015-05-27 ENCOUNTER — Encounter: Payer: Self-pay | Admitting: Adult Health

## 2015-05-27 ENCOUNTER — Non-Acute Institutional Stay (SKILLED_NURSING_FACILITY): Payer: Medicare Other | Admitting: Adult Health

## 2015-05-27 DIAGNOSIS — I10 Essential (primary) hypertension: Secondary | ICD-10-CM | POA: Diagnosis not present

## 2015-05-27 DIAGNOSIS — R627 Adult failure to thrive: Secondary | ICD-10-CM | POA: Diagnosis not present

## 2015-05-27 DIAGNOSIS — F0390 Unspecified dementia without behavioral disturbance: Secondary | ICD-10-CM

## 2015-05-27 DIAGNOSIS — J189 Pneumonia, unspecified organism: Secondary | ICD-10-CM

## 2015-05-27 DIAGNOSIS — N179 Acute kidney failure, unspecified: Secondary | ICD-10-CM

## 2015-05-27 DIAGNOSIS — E785 Hyperlipidemia, unspecified: Secondary | ICD-10-CM

## 2015-05-27 DIAGNOSIS — R609 Edema, unspecified: Secondary | ICD-10-CM

## 2015-05-27 DIAGNOSIS — R5381 Other malaise: Secondary | ICD-10-CM

## 2015-05-27 DIAGNOSIS — R1314 Dysphagia, pharyngoesophageal phase: Secondary | ICD-10-CM | POA: Diagnosis not present

## 2015-05-27 DIAGNOSIS — E876 Hypokalemia: Secondary | ICD-10-CM | POA: Diagnosis not present

## 2015-05-28 LAB — BASIC METABOLIC PANEL
BUN: 35 mg/dL — AB (ref 4–21)
CREATININE: 1.2 mg/dL — AB (ref 0.5–1.1)
GLUCOSE: 120 mg/dL
Sodium: 152 mmol/L — AB (ref 137–147)

## 2015-05-28 LAB — CBC AND DIFFERENTIAL
HEMATOCRIT: 42 % (ref 36–46)
Hemoglobin: 12 g/dL (ref 12.0–16.0)
PLATELETS: 233 10*3/uL (ref 150–399)
WBC: 8.8 10*3/mL

## 2015-05-29 ENCOUNTER — Non-Acute Institutional Stay (SKILLED_NURSING_FACILITY): Payer: Medicare Other | Admitting: Internal Medicine

## 2015-05-29 DIAGNOSIS — E7800 Pure hypercholesterolemia, unspecified: Secondary | ICD-10-CM

## 2015-05-29 DIAGNOSIS — J189 Pneumonia, unspecified organism: Secondary | ICD-10-CM | POA: Diagnosis not present

## 2015-05-29 DIAGNOSIS — D638 Anemia in other chronic diseases classified elsewhere: Secondary | ICD-10-CM

## 2015-05-29 DIAGNOSIS — E876 Hypokalemia: Secondary | ICD-10-CM

## 2015-05-29 DIAGNOSIS — R627 Adult failure to thrive: Secondary | ICD-10-CM | POA: Diagnosis not present

## 2015-05-29 DIAGNOSIS — F0390 Unspecified dementia without behavioral disturbance: Secondary | ICD-10-CM

## 2015-05-29 DIAGNOSIS — N179 Acute kidney failure, unspecified: Secondary | ICD-10-CM | POA: Diagnosis not present

## 2015-05-29 DIAGNOSIS — R1314 Dysphagia, pharyngoesophageal phase: Secondary | ICD-10-CM

## 2015-05-29 DIAGNOSIS — R5381 Other malaise: Secondary | ICD-10-CM

## 2015-05-29 DIAGNOSIS — R600 Localized edema: Secondary | ICD-10-CM | POA: Diagnosis not present

## 2015-05-29 NOTE — Progress Notes (Signed)
Patient ID: [name], female   DOB: 1930/04/06, 81 y.o.   MRN: [id number]     [facility]   PCP: Garlan Fillers, MD  Code Status: Full Code  Allergies  Allergen Reactions  . Latex Rash    Chief Complaint  Patient presents with  . New Admit To SNF    New Admit     HPI:  81 y.o. patient is here for short term rehabilitation post hospital admission from 05/17/15-05/24/15 with failure to thrive, UTI and CAP. She was started on antibiotics and iv fluids. She then had fluid overload and required diuresis. She has PMH of AS, dementia, obesity. She is seen in her room today. She is sitting on a gerichair and is in no distress. She participates minimally in conversation. Unable to obtain ROS. As per staff no fever. No falls reported. Getting treatment for wound in her leg. Under total care at present.  Review of Systems:  Unable to obtain  Past Medical History  Diagnosis Date  . DM (diabetes mellitus)   . Hyperlipidemia   . HTN (hypertension)   . CAD (coronary artery disease)     s/p NSTEMI 2003 with stent placement OM1.  PTCA with cutting balloon prox LAD. 2005 admission with chest pain, BMS placed to CFX and DES placed in RCA  . Aortic stenosis     moderate to severe  . Anemia   . CRI (chronic renal insufficiency)   . CVA (cerebral infarction)     remote; recovered  . Second degree Mobitz II AV block     s/p PPM 12/2009  . Hx of colonoscopy   . Headache 2003  . GERD (gastroesophageal reflux disease)   . Alzheimer's disease 07/2011   Past Surgical History  Procedure Laterality Date  . Hysterectomy    . Benign vocal cord tumor removed    . Right knee surgery      x 3 with TKA  . Pacemaker insertion  01/10/2010    [facility] [employer] DR RF implanted by Dr Johney Frame   Social History:   reports that she quit smoking about 24 years ago. She has never used smokeless tobacco. She reports that she does not drink alcohol or use illicit  drugs.  Family History  Problem Relation Age of Onset  . Coronary artery disease    . Hypertension    . Diabetes Mother   . Diabetes Father   . Heart disease Mother   . Colon cancer Neg Hx   . Stomach cancer Sister     Medications:   Medication List       This list is accurate as of: 05/29/15  9:54 AM.  Always use your most recent med list.               AMBULATORY NON FORMULARY MEDICATION  KCL 70meq/15ml Sig: Take by mouth once daily for hypokalemia     aspirin EC 81 MG tablet  Take 81 mg by mouth daily.     atorvastatin 40 MG tablet  Commonly known as:  LIPITOR  daily. Take one tablet by mouth once daily for cholesterol     diclofenac sodium 1 % Gel  Commonly known as:  VOLTAREN  Apply 2 g topically 4 (four) times daily as needed (pain).     donepezil 10 MG disintegrating tablet  Commonly known as:  ARICEPT ODT  Take 10 mg by mouth at bedtime.     furosemide 20 MG tablet  Commonly known as:  LASIX  Take 0.5 tablets (10 mg total) by mouth daily.     ROBITUSSIN DM PO  Take 10ml by mouth at 6am,2pm and 10pm for 2 weeks then as needed         Physical Exam: Filed Vitals:   05/29/15 0942  BP: 112/72  Pulse: 104  Temp: 98.6 F (37 C)  TempSrc: Oral  Resp: 16  Height:  (1.651 m)  Weight: 216 lb 3.2 oz (98.068 kg)  SpO2: 98%    General- elderly female, obese, in no acute distress Head- normocephalic, atraumatic Nose- normal nasal mucosa, no maxillary or frontal sinus tenderness, no nasal discharge Throat- moist mucus membrane Eyes- PERRLA, EOMI, no pallor, no icterus, no discharge, normal conjunctiva, normal sclera Neck- no cervical lymphadenopathy, no jugular vein distension Cardiovascular- normal s1,s2, systolic murmur +, bilateral leg edema Respiratory- bilateral clear to auscultation, no wheeze, no rhonchi, no crackles, no use of accessory muscles Abdomen- bowel sounds present, soft, non tender Musculoskeletal- able to move all 4  extremities, needs two person max assist and mechanical lift for transfer, on a geri recliner chair at present Neurological- alert and oriented to person Skin- warm and dry, right leg open wound Psychiatry- calm this visit    Labs reviewed: Basic Metabolic Panel:  Recent Labs  16/10/96 0714 05/22/15 0535 05/24/15 0428  NA 142 141 144  K 3.8 3.3* 3.0*  CL 107 107 108  CO2 GLUCOSE 156* 145* 138*  BUN 40* 42* 39*  CREATININE 1.34* 1.54* 1.34*  CALCIUM 10.2 10.1 9.7  MG  --   --  1.8   Liver Function Tests:  Recent Labs  05/17/15 1306  AST 36  ALT 15  ALKPHOS 63  BILITOT 2.1*  PROT 7.8  ALBUMIN 3.4*   No results for input(s): LIPASE, AMYLASE in the last 8760 hours. No results for input(s): AMMONIA in the last 8760 hours. CBC:  Recent Labs  05/17/15 1306  05/21/15 0714 05/22/15 0535 05/24/15 0428  WBC 9.7  < > 9.3 10.9* 10.2  NEUTROABS 7.8*  --   --   --   --   HGB 12.6  < > 12.6 11.5* 11.3*  HCT 40.1  < > 39.0 36.1 35.5*  MCV 84.1  < > 82.5 82.0 81.4  PLT 135*  < > 215 242 286  < > = values in this interval not displayed. Cardiac Enzymes:  Recent Labs  05/17/15 1306  TROPONINI 0.08*    Radiological Exams: Dg Chest 2 View  05/17/2015   CLINICAL DATA:  Decreased PO intake over the past few days. Hypertension.  EXAM: CHEST  2 VIEW  COMPARISON:  11/23/2013  FINDINGS: Left-sided pacemaker unchanged. Lungs are hypoinflated with subtle right base opacification which may be due to atelectasis or infection. Mild stable cardiomegaly. Remainder the exam is unchanged.  IMPRESSION: Subtle right base opacification which may be due to atelectasis or infection.  Stable mild cardiomegaly.   Electronically Signed   By: Elberta Fortis M.D.   On: 05/17/2015 14:15   Portable Chest 1 View  05/18/2015   CLINICAL DATA:  Community acquired pneumonia.  Follow-up.  EXAM: PORTABLE CHEST - 1 VIEW  COMPARISON:  05/17/2015.  11/23/2013.  FINDINGS: Dual lead pacemaker inserted  from a left subclavian approach is unchanged with leads in the right atrium and right ventricle. Atrial lead projection is somewhat different, but I believe this is positional. Heart size is at the upper limits  of normal. There is atherosclerosis of the aorta. There is venous hypertension possibly with early interstitial edema. There is mild volume loss at the right base that could be atelectasis or minimal pneumonia. No consolidation or lobar collapse. No visible effusion. Bony structures unremarkable.  IMPRESSION: Suspicion of venous hypertension with early interstitial edema.  Mild volume loss at the right base could be atelectasis or mild atelectatic pneumonia. No dense consolidation or lobar collapse.   Electronically Signed   By: Paulina Fusi M.D.   On: 05/18/2015 08:52    Assessment/Plan  Physical deconditioning Will have patient work with PT/OT as tolerated to regain strength and restore function.  Fall precautions are in place.  Failure to thrive Poor participation in HPI and ROS. Her dementia and recent infection could both be contributing to this. Under total care at present. Fall precautions to be taken. Pressure ulcer prophylaxis. Possible long term care resident  Dysphagia Currently on dysphagia 3 diet and thin liquids. Aspiration precautions. To work with SLP team  CAP Completed her antibiotics. Aspiration precautions. Monitor clinically  Hypokalemia continue kcl 20 meq daily and check bmp 1 week  Renal impairment  S/p iv fluids and later diuresis in hospital. Monitor bmp  Anemia of chronic disease Monitor h&h periodically. Borderline low hb at discharge  Right leg wound Cleanse right posterior lower leg wound area with normal saline and apply xeroform, tegaderm and leg wrap, continue wound care  HLD Continue lipitor 40 mg daily  Dementia without behavioral disturbance Continue aricept for now  Leg edema With venous stasis, continue lasix 10 meq daily and kcl 20 meq  daily. Continue leg wraps   Goals of care: short term rehabilitation but possible long term care   Labs/tests ordered: bmp  Family/ staff Communication: reviewed care plan with patient and nursing supervisor    Oneal Grout, MD  [facility] [phone] (Monday-Friday 8 am - 5 pm) [phone] (afterhours)

## 2015-05-31 NOTE — Progress Notes (Signed)
Patient ID: [name], female   DOB: 1930-01-16, 81 y.o.   MRN: [id number]    DATE:  05/27/15 MRN:  [id number]  BIRTHDAY: [birth year]  Facility:  Nursing Home Location:  [facility]  Nursing Home Room Number: [id number]  LEVEL OF CARE:  SNF [id number])  Contact Information    Name Relation Home Work [place] Daughter [phone]  [phone]   Chevis Pretty   [phone]   Steva Colder   [phone]       Chief Complaint  Patient presents with  . Hospitalization Follow-up    Physical deconditioning, failure to thrive, hypertension, CAP, dysphagia, acute kidney injury, dementia, hypokalemia, leg edema and hyperlipidemia    HISTORY OF PRESENT ILLNESS:  This is an 81 year old female who has been admitted to [facility] on 05/24/15 from [facility]. She has PMH of aortic stenosis, mild dementia and obesity. She was diagnosed with failure to thrive and mild UTI. Chest x-ray noted with atelectasis and possible pneumonia. IV fluids and antibiotics were given. She then became fluid overloaded and was started on Lasix on 9/20. Barium swallow done and was noted to tolerate sounds swallowing despite having no teeth. She was put on dysphagia 3 diet with thin liquids.  She has been admitted for a short-term rehabilitation and possible long-term care.  PAST MEDICAL HISTORY:  Past Medical History  Diagnosis Date  . DM (diabetes mellitus)   . Hyperlipidemia   . HTN (hypertension)   . CAD (coronary artery disease)     s/p NSTEMI 2003 with stent placement OM1.  PTCA with cutting balloon prox LAD. 2005 admission with chest pain, BMS placed to CFX and DES placed in RCA  . Aortic stenosis     moderate to severe  . Anemia   . CRI (chronic renal insufficiency)   . CVA (cerebral infarction)     remote; recovered  . Second degree Mobitz II AV block     s/p PPM 12/2009  . Hx of colonoscopy   . Headache 2003  . GERD (gastroesophageal reflux  disease)   . Alzheimer's disease 07/2011     CURRENT MEDICATIONS: Reviewed  Patient's Medications  New Prescriptions   No medications on file  Previous Medications   AMBULATORY NON FORMULARY MEDICATION    KCL 78meq/15ml Sig: Take by mouth once daily for hypokalemia   ASPIRIN EC 81 MG TABLET    Take 81 mg by mouth daily.   ATORVASTATIN (LIPITOR) 40 MG TABLET    daily. Take one tablet by mouth once daily for cholesterol   DEXTROMETHORPHAN-GUAIFENESIN (ROBITUSSIN DM PO)    Take 10ml by mouth at 6am,2pm and 10pm for 2 weeks then as needed   DICLOFENAC SODIUM (VOLTAREN) 1 % GEL    Apply 2 g topically 4 (four) times daily as needed (pain).   DONEPEZIL (ARICEPT ODT) 10 MG DISINTEGRATING TABLET    Take 10 mg by mouth at bedtime.   FUROSEMIDE (LASIX) 20 MG TABLET    Take 0.5 tablets (10 mg total) by mouth daily.   POTASSIUM CHLORIDE (KLOR-CON) 20 MEQ PACKET    Take 20 mEq by mouth daily.  Modified Medications   No medications on file  Discontinued Medications   DEXTROMETHORPHAN (DELSYM) 30 MG/5ML LIQUID    Take 5 mLs (30 mg total) by mouth 2 (two) times daily.   GUAIFENESIN (MUCINEX) 600 MG 12 HR TABLET    Take 1 tablet (600 mg total) by mouth 2 (two)  times daily.   POTASSIUM CHLORIDE (K-DUR) 10 MEQ TABLET    Take 1 tablet (10 mEq total) by mouth 3 (three) times daily.   ROSUVASTATIN (CRESTOR) 10 MG TABLET    Take 1 tablet (10 mg total) by mouth daily.     Allergies  Allergen Reactions  . Latex Rash     REVIEW OF SYSTEMS:  Unable to obtain due to being not a good historian   PHYSICAL EXAMINATION  GENERAL APPEARANCE: In no acute distress.  SKIN:  Dressing on BLE HEAD: Normal in size and contour. No evidence of trauma EYES: Lids open and close normally. No blepharitis, entropion or ectropion. PERRL. Conjunctivae are clear and sclerae are white. Lenses are without opacity EARS: Pinnae are normal. Patient hears normal voice tunes of the examiner MOUTH and THROAT: Lips are without  lesions. Oral mucosa is moist and without lesions. Tongue is normal in shape, size, and color and without lesions NECK: supple, trachea midline, no neck masses, no thyroid tenderness, no thyromegaly LYMPHATICS: no LAN in the neck, no supraclavicular LAN RESPIRATORY: breathing is even & unlabored, BS CTAB CARDIAC: RRR, no murmur,no extra heart sounds, BLE trace edema  GI: abdomen soft, normal BS, no masses, no tenderness, no hepatomegaly, no splenomegaly PSYCHIATRIC: Alert and oriented to self.  Affect and behavior are appropriate  LABS/RADIOLOGY: Labs reviewed: Basic Metabolic Panel:  Recent Labs  16/10/96 0714 05/22/15 0535 05/24/15 0428  NA 142 141 144  K 3.8 3.3* 3.0*  CL 107 107 108  CO2 GLUCOSE 156* 145* 138*  BUN 40* 42* 39*  CREATININE 1.34* 1.54* 1.34*  CALCIUM 10.2 10.1 9.7  MG  --   --  1.8   Liver Function Tests:  Recent Labs  05/17/15 1306  AST 36  ALT 15  ALKPHOS 63  BILITOT 2.1*  PROT 7.8  ALBUMIN 3.4*    CBC:  Recent Labs  05/17/15 1306  05/21/15 0714 05/22/15 0535 05/24/15 0428  WBC 9.7  < > 9.3 10.9* 10.2  NEUTROABS 7.8*  --   --   --   --   HGB 12.6  < > 12.6 11.5* 11.3*  HCT 40.1  < > 39.0 36.1 35.5*  MCV 84.1  < > 82.5 82.0 81.4  PLT 135*  < > 215 242 286  < > = values in this interval not displayed.  Cardiac Enzymes:  Recent Labs  05/17/15 1306  TROPONINI 0.08*     Dg Chest 2 View  05/17/2015   CLINICAL DATA:  Decreased PO intake over the past few days. Hypertension.  EXAM: CHEST  2 VIEW  COMPARISON:  11/23/2013  FINDINGS: Left-sided pacemaker unchanged. Lungs are hypoinflated with subtle right base opacification which may be due to atelectasis or infection. Mild stable cardiomegaly. Remainder the exam is unchanged.  IMPRESSION: Subtle right base opacification which may be due to atelectasis or infection.  Stable mild cardiomegaly.   Electronically Signed   By: Elberta Fortis M.D.   On: 05/17/2015 14:15   Dg Chest Port  1 View  05/23/2015   CLINICAL DATA:  Cough  EXAM: PORTABLE CHEST 1 VIEW  COMPARISON:  05/20/2015  FINDINGS: Cardiomediastinal silhouette is stable. Mild interstitial prominence bilaterally probable mild edema again noted. Hazy right basilar atelectasis or infiltrate. Dual lead cardiac pacemaker is unchanged in position.  IMPRESSION: Again noted mild interstitial prominence bilateral probable mild edema. Hazy right basilar atelectasis or infiltrate.   Electronically Signed   By: Natasha Mead  M.D.   On: 05/23/2015 11:20   Dg Chest Port 1 View  05/20/2015   CLINICAL DATA:  Cough, history of diabetes hypertension coronary artery disease, former smoker  EXAM: PORTABLE CHEST - 1 VIEW  COMPARISON:  05/18/2015  FINDINGS: Cardiac pacer in unchanged position. Stable mild to moderate cardiac enlargement with uncoiling of the aorta. There is peribronchial cuffing bilaterally. There is moderate diffuse interstitial prominence, increased when compared to prior study. Tiny right pleural effusion.  IMPRESSION: Suspect interstitial pulmonary edema related to congestive heart failure.   Electronically Signed   By: Esperanza Heir M.D.   On: 05/20/2015 07:31   Portable Chest 1 View  05/18/2015   CLINICAL DATA:  Community acquired pneumonia.  Follow-up.  EXAM: PORTABLE CHEST - 1 VIEW  COMPARISON:  05/17/2015.  11/23/2013.  FINDINGS: Dual lead pacemaker inserted from a left subclavian approach is unchanged with leads in the right atrium and right ventricle. Atrial lead projection is somewhat different, but I believe this is positional. Heart size is at the upper limits of normal. There is atherosclerosis of the aorta. There is venous hypertension possibly with early interstitial edema. There is mild volume loss at the right base that could be atelectasis or minimal pneumonia. No consolidation or lobar collapse. No visible effusion. Bony structures unremarkable.  IMPRESSION: Suspicion of venous hypertension with early interstitial  edema.  Mild volume loss at the right base could be atelectasis or mild atelectatic pneumonia. No dense consolidation or lobar collapse.   Electronically Signed   By: Paulina Fusi M.D.   On: 05/18/2015 08:52    ASSESSMENT/PLAN:  Physical deconditioning - for rehabilitation  Failure to thrive - multifactorial; dementia and acute illness; continue supportive care  Hypertension - continue Lasix 10 mg by mouth daily; check BP twice a day 1 week  CAP - completed azithromycin and Rocephin 7 days  Dysphagia - recently downgraded to dysphagia 3 diet with thin liquids  Acute kidney injury - creatinine 1.34; check BMP  Senile dementia - continue Aricept 10 mg 1 tab by mouth daily at bedtime  Leg edema - continue Lasix 10 mg by mouth daily  Hypokalemia - K3.0; continue K-Dur 10 and MICU 1 tab by mouth 3 times a day  Hyperlipidemia - continue Crestor 10 mg 1 tab by mouth daily    Goals of care:  Short-term rehabilitation    [facility], NP [facility] [phone]

## 2015-06-07 ENCOUNTER — Emergency Department (HOSPITAL_COMMUNITY): Payer: Medicare Other

## 2015-06-07 ENCOUNTER — Emergency Department (HOSPITAL_COMMUNITY)
Admission: EM | Admit: 2015-06-07 | Discharge: 2015-06-08 | Disposition: A | Discharge status: Home / Self Care | Payer: Medicare Other | Attending: Emergency Medicine | Admitting: Emergency Medicine

## 2015-06-07 ENCOUNTER — Encounter (HOSPITAL_COMMUNITY): Payer: Self-pay | Admitting: Emergency Medicine

## 2015-06-07 DIAGNOSIS — Z8673 Personal history of transient ischemic attack (TIA), and cerebral infarction without residual deficits: Secondary | ICD-10-CM | POA: Insufficient documentation

## 2015-06-07 DIAGNOSIS — E119 Type 2 diabetes mellitus without complications: Secondary | ICD-10-CM | POA: Diagnosis not present

## 2015-06-07 DIAGNOSIS — I2510 Atherosclerotic heart disease of native coronary artery without angina pectoris: Secondary | ICD-10-CM | POA: Insufficient documentation

## 2015-06-07 DIAGNOSIS — F0280 Dementia in other diseases classified elsewhere without behavioral disturbance: Secondary | ICD-10-CM | POA: Insufficient documentation

## 2015-06-07 DIAGNOSIS — Z7982 Long term (current) use of aspirin: Secondary | ICD-10-CM | POA: Insufficient documentation

## 2015-06-07 DIAGNOSIS — I129 Hypertensive chronic kidney disease with stage 1 through stage 4 chronic kidney disease, or unspecified chronic kidney disease: Secondary | ICD-10-CM | POA: Diagnosis not present

## 2015-06-07 DIAGNOSIS — Z8719 Personal history of other diseases of the digestive system: Secondary | ICD-10-CM | POA: Insufficient documentation

## 2015-06-07 DIAGNOSIS — R011 Cardiac murmur, unspecified: Secondary | ICD-10-CM | POA: Insufficient documentation

## 2015-06-07 DIAGNOSIS — N189 Chronic kidney disease, unspecified: Secondary | ICD-10-CM | POA: Diagnosis not present

## 2015-06-07 DIAGNOSIS — Z79899 Other long term (current) drug therapy: Secondary | ICD-10-CM | POA: Diagnosis not present

## 2015-06-07 DIAGNOSIS — Z91040 Latex allergy status: Secondary | ICD-10-CM | POA: Diagnosis not present

## 2015-06-07 DIAGNOSIS — R630 Anorexia: Secondary | ICD-10-CM | POA: Insufficient documentation

## 2015-06-07 DIAGNOSIS — Z950 Presence of cardiac pacemaker: Secondary | ICD-10-CM | POA: Insufficient documentation

## 2015-06-07 DIAGNOSIS — Z87891 Personal history of nicotine dependence: Secondary | ICD-10-CM | POA: Diagnosis not present

## 2015-06-07 DIAGNOSIS — G309 Alzheimer's disease, unspecified: Secondary | ICD-10-CM | POA: Diagnosis not present

## 2015-06-07 LAB — COMPREHENSIVE METABOLIC PANEL
ALBUMIN: 2.9 g/dL — AB (ref 3.5–5.0)
ALT: 19 U/L (ref 14–54)
ANION GAP: 8 (ref 5–15)
AST: 30 U/L (ref 15–41)
Alkaline Phosphatase: 77 U/L (ref 38–126)
BILIRUBIN TOTAL: 1.1 mg/dL (ref 0.3–1.2)
BUN: 11 mg/dL (ref 6–20)
CO2: 25 mmol/L (ref 22–32)
Calcium: 9.3 mg/dL (ref 8.9–10.3)
Chloride: 106 mmol/L (ref 101–111)
Creatinine, Ser: 0.89 mg/dL (ref 0.44–1.00)
GFR calc Af Amer: >60 mL/min (ref >60)
GFR, EST NON AFRICAN AMERICAN: 58 mL/min — AB (ref >60)
GLUCOSE: 91 mg/dL (ref 65–99)
POTASSIUM: 3.3 mmol/L — AB (ref 3.5–5.1)
Sodium: 139 mmol/L (ref 135–145)
TOTAL PROTEIN: 7.1 g/dL (ref 6.5–8.1)

## 2015-06-07 LAB — CBC WITH DIFFERENTIAL/PLATELET
BASOS ABS: 0 10*3/uL (ref 0.0–0.1)
Basophils Relative: 0 %
Eosinophils Absolute: 0.1 10*3/uL (ref 0.0–0.7)
Eosinophils Relative: 1 %
HEMATOCRIT: 36.8 % (ref 36.0–46.0)
Hemoglobin: 11.6 g/dL — ABNORMAL LOW (ref 12.0–15.0)
LYMPHS PCT: 46 %
Lymphs Abs: 2.3 10*3/uL (ref 0.7–4.0)
MCH: 26.2 pg (ref 26.0–34.0)
MCHC: 31.5 g/dL (ref 30.0–36.0)
MCV: 83.1 fL (ref 78.0–100.0)
Monocytes Absolute: 0.3 10*3/uL (ref 0.1–1.0)
Monocytes Relative: 5 %
NEUTROS ABS: 2.4 10*3/uL (ref 1.7–7.7)
Neutrophils Relative %: 48 %
Platelets: 155 10*3/uL (ref 150–400)
RBC: 4.43 MIL/uL (ref 3.87–5.11)
RDW: 15 % (ref 11.5–15.5)
WBC: 5 10*3/uL (ref 4.0–10.5)

## 2015-06-07 NOTE — ED Notes (Signed)
Bed: ZO10 Expected date:  Expected time:  Means of arrival:  Comments: EMS 89F not eating

## 2015-06-07 NOTE — ED Notes (Signed)
Pt arrived via EMS from [facility] with a complaint of not eating.  The pt is not a self feeder and the nursing staff stated that the patient is not eating when attempts were made to do so.  Pt does have a history of dementia and stage 1 alzheimer but has in the last week become aggressive with staff which is an abnormal presentation for her.

## 2015-06-07 NOTE — ED Notes (Signed)
Delay in lab draw, edp in room with pt 

## 2015-06-07 NOTE — ED Notes (Addendum)
Delay in lab draw, tech capturing  ekg 

## 2015-06-07 NOTE — ED Provider Notes (Signed)
CSN: [id number]     Arrival date & time 06/07/15  2032 History   First MD Initiated Contact with Patient 06/07/15 2046     Chief Complaint  Patient presents with  . Eating Disorder     (Consider location/radiation/quality/duration/timing/severity/associated sxs/prior Treatment) HPI Comments: 81 year old female with extensive past medical history including Alzheimer's dementia, type 2 diabetes mellitus, CAD, aortic stenosis, CK D, CVA who presents with decreased appetite. History limited due to the patient's dementia and obtained primarily from the patient's son as well as her nursing facility. Nursing staff noted that the patient has not wanted to eat for the past few days. She was aggressive last week with staff which was abnormal for her. Son reports that she has not had any complaints of pain or any fevers, vomiting, or diarrhea.  The history is provided by a relative and the nursing home.    Past Medical History  Diagnosis Date  . DM (diabetes mellitus) (HCC)   . Hyperlipidemia   . HTN (hypertension)   . CAD (coronary artery disease)     s/p NSTEMI 2003 with stent placement OM1.  PTCA with cutting balloon prox LAD. 2005 admission with chest pain, BMS placed to CFX and DES placed in RCA  . Aortic stenosis     moderate to severe  . Anemia   . CRI (chronic renal insufficiency)   . CVA (cerebral infarction)     remote; recovered  . Second degree Mobitz II AV block     s/p PPM 12/2009  . Hx of colonoscopy   . Headache 2003  . GERD (gastroesophageal reflux disease)   . Alzheimer's disease 07/2011   Past Surgical History  Procedure Laterality Date  . Hysterectomy    . Benign vocal cord tumor removed    . Right knee surgery      x 3 with TKA  . Pacemaker insertion  01/10/2010    [facility] DR RF implanted by Dr Johney Frame   Family History  Problem Relation Age of Onset  . Coronary artery disease    . Hypertension    . Diabetes Mother   . Diabetes Father   . Heart disease  Mother   . Colon cancer Neg Hx   . Stomach cancer Sister    Social History  Substance Use Topics  . Smoking status: Former Smoker -- 0.50 packs/day for 30 years    Quit date: 08/31/1990  . Smokeless tobacco: Never Used  . Alcohol Use: No   OB History    No data available     Review of Systems  Unable to perform ROS: Dementia      Allergies  Latex  Home Medications   Prior to Admission medications   Medication Sig Start Date End Date Taking? Authorizing Provider  aspirin EC 81 MG tablet Take 81 mg by mouth daily.   Yes Historical Provider, MD  Dextromethorphan-Guaifenesin (ROBITUSSIN DM PO) Take 10 mLs by mouth 3 (three) times daily. Take 10ml by mouth at 6am,2pm and 10pm for 2 weeks then as needed cough   Yes Historical Provider, MD  diclofenac sodium (VOLTAREN) 1 % GEL Apply 2 g topically 4 (four) times daily as needed (pain).   Yes Historical Provider, MD  donepezil (ARICEPT ODT) 10 MG disintegrating tablet Take 10 mg by mouth at bedtime.   Yes Historical Provider, MD  furosemide (LASIX) 20 MG tablet Take 0.5 tablets (10 mg total) by mouth daily. 11/23/13  Yes Gwyneth Sprout, MD  PRESCRIPTION MEDICATION Take  15 mLs by mouth 2 (two) times daily. Elder tonic- 15 ml bid   Yes Historical Provider, MD  atorvastatin (LIPITOR) 40 MG tablet daily. Take one tablet by mouth once daily for cholesterol    Historical Provider, MD  metoprolol succinate (TOPROL-XL) 50 MG 24 hr tablet Take 1 tablet by mouth daily. 05/27/15   Historical Provider, MD  potassium chloride (KLOR-CON) 20 MEQ packet Take 20 mEq by mouth 2 (two) times daily.    Historical Provider, MD   BP 98/75 mmHg  Pulse 82  Temp(Src) 97.5 F (36.4 C) (Oral)  Resp 19  SpO2 96% Physical Exam  Constitutional: She appears well-developed and well-nourished. No distress.  HENT:  Head: Normocephalic and atraumatic.  Mouth/Throat: Oropharynx is clear and moist.  Moist mucous membranes, edentulous  Eyes: Conjunctivae are  normal. Pupils are equal, round, and reactive to light.  Neck: Neck supple.  Cardiovascular: Normal rate and regular rhythm.   3/6 systolic murmur  Pulmonary/Chest: Effort normal.  Diffuse wheezes and rhonchi b/l, normal WOB and good air movement  Abdominal: Soft. Bowel sounds are normal. She exhibits no distension. There is no tenderness.  Musculoskeletal: She exhibits no edema.  Neurological:  Oriented to person only, moves all 4 ext  Skin: Skin is warm and dry.  Psychiatric:  Occasionally babbles about non-sensical things, calm  Nursing note and vitals reviewed.   ED Course  Procedures (including critical care time) Labs Review Labs Reviewed  COMPREHENSIVE METABOLIC PANEL - Abnormal; Notable for the following:    Potassium 3.3 (*)    Albumin 2.9 (*)    GFR calc non Af Amer 58 (*)    All other components within normal limits  CBC WITH DIFFERENTIAL/PLATELET - Abnormal; Notable for the following:    Hemoglobin 11.6 (*)    All other components within normal limits  URINALYSIS, ROUTINE W REFLEX MICROSCOPIC (NOT AT [facility])    Imaging Review Dg Chest 2 View  06/07/2015   CLINICAL DATA:  Loss of appetite, acute onset.  Initial encounter.  EXAM: CHEST  2 VIEW  COMPARISON:  Chest radiograph performed 05/23/2015  FINDINGS: The lungs are mildly hypoexpanded. Mild right basilar opacity may reflect atelectasis or possibly mild pneumonia. There is no evidence of pleural effusion or pneumothorax.  The heart is borderline normal in size. A pacemaker is noted at the left chest wall, with leads ending at the right atrium and right ventricle. No acute osseous abnormalities are seen. Bridging osteophytes are noted along the thoracic spine.  IMPRESSION: Lungs mildly hypoexpanded. Mild right basilar airspace opacity may reflect atelectasis or possibly mild pneumonia.   Electronically Signed   By: Roanna Raider M.D.   On: 06/07/2015 23:03   I have personally reviewed and evaluated these lab results as  part of my medical decision-making.   EKG Interpretation   Date/Time:  Friday June 07 2015 21:55:46 EDT Ventricular Rate:  71 PR Interval:  205 QRS Duration: 99 QT Interval:  528 QTC Calculation: 574 R Axis:   10 Text Interpretation:  Sinus arrhythmia Probable left ventricular  hypertrophy Anterior Q waves, possibly due to LVH Nonspecific T  abnormalities, lateral leads Prolonged QT interval T wave inversions in  inferior and precordial leads have normalized since previous Confirmed by  Ahria Slappey MD, Mujahid Jalomo ([id number]) on 06/07/2015 10:13:02 PM     MDM   Final diagnoses:  Decreased appetite  alzheimer's dementia   81 year old female with Alzheimer's dementia who presents with not eating for the past several days despite  nursing staff trying to feed her. Patient awake, alert, comfortable at presentation. Vital signs unremarkable. She had bilateral wheezing on exam but moist mucous membranes. Sent above labwork to evaluate for dehydration or signs of infection. Also obtained chest x-ray.  Labs showed normal creatinine at 0.89, reassuring CBC. UA is pending. Labs reassuring against severe dehydration. Chest x-ray shows mild haziness in right lung base atelectasis versus pneumonia. Son denies any fevers or recent cough. Patient has been afebrile here with normal work of breathing and normal O2 sat duration so on room air, therefore I feel pneumonia is less likely. Pt was able to drink juice and eat part of a sandwich in the ED without complaints. I have discussed results with son and encouraged him to talk with staff regarding extra time and patience during the patient's meals as she seems willing to eat here if prompted. She has not demonstrated any agitation or abnormal behavior in ED. We will await results of UA and if negative the patient will be discharged home with instructions to follow-up with PCP for reassessment.  Laurence Spates, MD 06/08/15 [phone]

## 2015-06-08 DIAGNOSIS — R630 Anorexia: Secondary | ICD-10-CM | POA: Diagnosis not present

## 2015-06-08 LAB — URINE MICROSCOPIC-ADD ON

## 2015-06-08 LAB — URINALYSIS, ROUTINE W REFLEX MICROSCOPIC
Glucose, UA: NEGATIVE mg/dL
KETONES UR: NEGATIVE mg/dL
NITRITE: NEGATIVE
Protein, ur: 30 mg/dL — AB
Specific Gravity, Urine: 1.026 (ref 1.005–1.030)
UROBILINOGEN UA: 1 mg/dL (ref 0.0–1.0)
pH: 5.5 (ref 5.0–8.0)

## 2015-06-08 MED ORDER — IBUPROFEN 200 MG PO TABS
400.0000 mg | ORAL_TABLET | Freq: Once | ORAL | Status: AC
  Administered 2015-06-08: 400 mg via ORAL
  Filled 2015-06-08: qty 2

## 2015-06-08 NOTE — ED Notes (Signed)
Call for pick up of pt again,

## 2015-06-18 LAB — BASIC METABOLIC PANEL
BUN: 17 mg/dL (ref 4–21)
Creatinine: 15.2 mg/dL — AB (ref 0.5–1.1)
GLUCOSE: 86 mg/dL
POTASSIUM: 3.9 mmol/L (ref 3.4–5.3)
Sodium: 144 mmol/L (ref 137–147)

## 2015-08-07 ENCOUNTER — Encounter: Payer: Self-pay | Admitting: Adult Health

## 2015-08-07 ENCOUNTER — Non-Acute Institutional Stay (SKILLED_NURSING_FACILITY): Payer: Medicare Other | Admitting: Adult Health

## 2015-08-07 DIAGNOSIS — E785 Hyperlipidemia, unspecified: Secondary | ICD-10-CM | POA: Diagnosis not present

## 2015-08-07 DIAGNOSIS — F0390 Unspecified dementia without behavioral disturbance: Secondary | ICD-10-CM | POA: Diagnosis not present

## 2015-08-07 DIAGNOSIS — G4700 Insomnia, unspecified: Secondary | ICD-10-CM

## 2015-08-07 DIAGNOSIS — F329 Major depressive disorder, single episode, unspecified: Secondary | ICD-10-CM | POA: Diagnosis not present

## 2015-08-07 DIAGNOSIS — E876 Hypokalemia: Secondary | ICD-10-CM | POA: Diagnosis not present

## 2015-08-07 DIAGNOSIS — F32A Depression, unspecified: Secondary | ICD-10-CM

## 2015-08-07 DIAGNOSIS — R600 Localized edema: Secondary | ICD-10-CM

## 2015-08-07 DIAGNOSIS — L89152 Pressure ulcer of sacral region, stage 2: Secondary | ICD-10-CM | POA: Diagnosis not present

## 2015-08-07 DIAGNOSIS — M159 Polyosteoarthritis, unspecified: Secondary | ICD-10-CM | POA: Diagnosis not present

## 2015-08-07 DIAGNOSIS — I2510 Atherosclerotic heart disease of native coronary artery without angina pectoris: Secondary | ICD-10-CM | POA: Diagnosis not present

## 2015-08-07 NOTE — Progress Notes (Addendum)
Patient ID: [name], female   DOB: September 26, 1929, 81 y.o.   MRN: [id number]    DATE:  08/07/15  MRN:  [id number]  BIRTHDAY: 06/10/1930  Facility:  Nursing Home Location:  [facility]  Nursing Home Room Number: 203-2  LEVEL OF CARE:  SNF [phone])  Contact Information    Name Relation Home Work Wing Daughter [phone]  [phone]   Chevis Pretty   [phone]   Steva Colder   [phone]      Chief Complaint  Patient presents with  . Medical Management of Chronic Issues     Bilateral lower extremity edema, hyperlipidemia, sacral pressure ulcer, insomnia, osteoarthritis, depression, dementia, CAD and hypokalemia    HISTORY OF PRESENT ILLNESS:  This is an 81 year old female who is being seen on a routine visit. She has PMH of aortic stenosis, mild dementia and obesity. She was noted to have blisters with slight weeping of clear fluids on BLE. She has 2+ edema of BLE. Latest weight is 230.2 lbs which has improved from admission wt of 216.2 lbs.  PAST MEDICAL HISTORY:  Past Medical History  Diagnosis Date  . DM (diabetes mellitus) (HCC)   . Hyperlipidemia   . HTN (hypertension)   . CAD (coronary artery disease)     s/p NSTEMI 2003 with stent placement OM1.  PTCA with cutting balloon prox LAD. 2005 admission with chest pain, BMS placed to CFX and DES placed in RCA  . Aortic stenosis     moderate to severe  . Anemia   . CRI (chronic renal insufficiency)   . CVA (cerebral infarction)     remote; recovered  . Second degree Mobitz II AV block     s/p PPM 12/2009  . Hx of colonoscopy   . Headache 2003  . GERD (gastroesophageal reflux disease)   . Alzheimer's disease 07/2011     CURRENT MEDICATIONS: Reviewed  Patient's Medications  New Prescriptions   No medications on file  Previous Medications   ASPIRIN EC 81 MG TABLET    Take 81 mg by mouth daily.   ATORVASTATIN (LIPITOR) 40 MG TABLET    daily. Take one tablet  by mouth once daily for cholesterol   DICLOFENAC SODIUM (VOLTAREN) 1 % GEL    Apply 2 g topically 4 (four) times daily as needed (pain).   DONEPEZIL (ARICEPT ODT) 10 MG DISINTEGRATING TABLET    Take 10 mg by mouth at bedtime.   FUROSEMIDE (LASIX) 20 MG TABLET    Take 20 mg by mouth daily.   MELATONIN 3 MG TABS    Take 3 mg by mouth at bedtime.   MIRTAZAPINE (REMERON) 15 MG TABLET    Take 15 mg by mouth at bedtime.   POTASSIUM CHLORIDE (K-DUR) 10 MEQ TABLET    Take 10 mEq by mouth daily.   PRESCRIPTION MEDICATION    Take 15 mLs by mouth 2 (two) times daily. Elder tonic- 15 ml bid  Modified Medications   No medications on file  Discontinued Medications   DEXTROMETHORPHAN-GUAIFENESIN (ROBITUSSIN DM PO)    Take 10 mLs by mouth 3 (three) times daily. Take 10ml by mouth at 6am,2pm and 10pm for 2 weeks then as needed cough   FUROSEMIDE (LASIX) 20 MG TABLET    Take 0.5 tablets (10 mg total) by mouth daily.   METOPROLOL SUCCINATE (TOPROL-XL) 50 MG 24 HR TABLET    Take 1 tablet by mouth daily.   POTASSIUM CHLORIDE (KLOR-CON)  20 MEQ PACKET    Take 20 mEq by mouth 2 (two) times daily.     Allergies  Allergen Reactions  . Latex Rash     REVIEW OF SYSTEMS:  Unable to obtain due to being not a good historian   PHYSICAL EXAMINATION  GENERAL APPEARANCE: In no acute distress.  SKIN:  BLE has blisters weeping serous drainage, right shin has open wound which is yellowish in color, dry, and has stage 2 sacral pressure sore HEAD: Normal in size and contour. No evidence of trauma EYES: Lids open and close normally. No blepharitis, entropion or ectropion. PERRL. Conjunctivae are clear and sclerae are white. Lenses are without opacity EARS: Pinnae are normal. Patient hears normal voice tunes of the examiner MOUTH and THROAT: Lips are without lesions. Oral mucosa is moist and without lesions. Tongue is normal in shape, size, and color and without lesions NECK: supple, trachea midline, no neck masses, no  thyroid tenderness, no thyromegaly LYMPHATICS: no LAN in the neck, no supraclavicular LAN RESPIRATORY: breathing is even & unlabored, BS CTAB CARDIAC: RRR, no murmur,no extra heart sounds, BLE edema 2+ GI: abdomen soft, normal BS, no masses, no tenderness, no hepatomegaly, no splenomegaly EXTREMITIES:  Able to move 4 extremities PSYCHIATRIC: Alert and oriented to self.  Affect and behavior are appropriate  LABS/RADIOLOGY: Labs reviewed: 07/17/15  right lower extremity venous duplex ultrasound is negative for DVT 06/18/15  sodium 144 potassium 3.9 glucose 86 BUN 17 creatinine 1.12 calcium 9.8 06/05/15  sodium 143 potassium 3.3 glucose 93 BUN 13 creatinine 1.07 calcium 9.0 Basic Metabolic Panel:  Recent Labs  40/98/11 0535 05/24/15 0428 06/07/15 2252  NA 141 144 139  K 3.3* 3.0* 3.3*  CL 107 108 106  CO2 GLUCOSE 145* 138* 91  BUN 42* 39* 11  CREATININE 1.54* 1.34* 0.89  CALCIUM 10.1 9.7 9.3  MG  --  1.8  --    Liver Function Tests:  Recent Labs  05/17/15 1306 06/07/15 2252  AST 36 30  ALT 15 19  ALKPHOS 63 77  BILITOT 2.1* 1.1  PROT 7.8 7.1  ALBUMIN 3.4* 2.9*    CBC:  Recent Labs  05/17/15 1306  05/22/15 0535 05/24/15 0428 06/07/15 2252  WBC 9.7  < > 10.9* 10.2 5.0  NEUTROABS 7.8*  --   --   --  2.4  HGB 12.6  < > 11.5* 11.3* 11.6*  HCT 40.1  < > 36.1 35.5* 36.8  MCV 84.1  < > 82.0 81.4 83.1  PLT 135*  < > 242 286 155  < > = values in this interval not displayed.  Cardiac Enzymes:  Recent Labs  05/17/15 1306  TROPONINI 0.08*     ASSESSMENT/PLAN:  Senile dementia - continue Aricept 10 mg 1 tab by mouth daily at bedtime  Bilateral lower extremity edema - start Lasix 20 mg 1 tab by mouth daily; CMP in 1 week  Hypokalemia - start KCl 10 meq 1 by mouth daily  Hyperlipidemia - continue Lipitor 40 mg 1 tab by mouth daily  Sacral pressure ulcer stage II - continue wound treatment and decubivite  one tab by mouth daily; check CBC in 1  week  Insomnia -  recently started on melatonin 3 mg 1 tab by mouth daily at bedtime   Osteoarthritis, generalized - continue Voltaren gel 1% 2 g BLE 4 times a day when necessary  Depression - mood is stable; continue Remeron 15 mg by mouth daily at  bedtime  Dementia - continue Aricept 10 mg 1 tab by mouth daily at bedtime  CAD - stable; continue aspirin 81 mg EC 1 tab by mouth daily     Goals of care:  Long-term rehabilitation    [facility] CenterMEDINA-VARGAS,MONINA, NP [facility] [phone]

## 2015-08-14 LAB — CBC AND DIFFERENTIAL
HEMATOCRIT: 34 % — AB (ref 36–46)
HEMOGLOBIN: 10.1 g/dL — AB (ref 12.0–16.0)
PLATELETS: 197 10*3/uL (ref 150–399)
WBC: 5 10^3/mL

## 2015-08-14 LAB — BASIC METABOLIC PANEL
BUN: 14 mg/dL (ref 4–21)
Creatinine: 13.5 mg/dL — AB (ref 0.5–1.1)
GLUCOSE: 83 mg/dL
Potassium: 4 mmol/L (ref 3.4–5.3)
SODIUM: 140 mmol/L (ref 137–147)

## 2015-08-14 LAB — HEPATIC FUNCTION PANEL
ALK PHOS: 123 U/L (ref 25–125)
ALT: 26 U/L (ref 7–35)
AST: 25 U/L (ref 13–35)
BILIRUBIN, TOTAL: 0.6 mg/dL

## 2015-08-30 LAB — CBC AND DIFFERENTIAL
HEMATOCRIT: 34 % — AB (ref 36–46)
HEMOGLOBIN: 10 g/dL — AB (ref 12.0–16.0)
NEUTROS ABS: 2 /uL
Platelets: 198 10*3/uL (ref 150–399)
WBC: 4.6 10^3/mL

## 2015-08-30 LAB — BASIC METABOLIC PANEL
BUN: 19 mg/dL (ref 4–21)
Creatinine: 1 mg/dL (ref 0.5–1.1)
Glucose: 92 mg/dL
Potassium: 4.3 mmol/L (ref 3.4–5.3)
SODIUM: 143 mmol/L (ref 137–147)

## 2015-09-16 ENCOUNTER — Encounter: Payer: Self-pay | Admitting: Internal Medicine

## 2015-09-16 ENCOUNTER — Non-Acute Institutional Stay (SKILLED_NURSING_FACILITY): Payer: Medicare Other | Admitting: Internal Medicine

## 2015-09-16 DIAGNOSIS — D638 Anemia in other chronic diseases classified elsewhere: Secondary | ICD-10-CM | POA: Diagnosis not present

## 2015-09-16 DIAGNOSIS — I2510 Atherosclerotic heart disease of native coronary artery without angina pectoris: Secondary | ICD-10-CM | POA: Diagnosis not present

## 2015-09-16 DIAGNOSIS — F0280 Dementia in other diseases classified elsewhere without behavioral disturbance: Secondary | ICD-10-CM | POA: Insufficient documentation

## 2015-09-16 DIAGNOSIS — W19XXXA Unspecified fall, initial encounter: Secondary | ICD-10-CM

## 2015-09-16 DIAGNOSIS — I739 Peripheral vascular disease, unspecified: Secondary | ICD-10-CM | POA: Diagnosis not present

## 2015-09-16 DIAGNOSIS — G309 Alzheimer's disease, unspecified: Secondary | ICD-10-CM

## 2015-09-16 DIAGNOSIS — E785 Hyperlipidemia, unspecified: Secondary | ICD-10-CM

## 2015-09-16 DIAGNOSIS — G308 Other Alzheimer's disease: Secondary | ICD-10-CM

## 2015-09-16 DIAGNOSIS — F0281 Dementia in other diseases classified elsewhere with behavioral disturbance: Secondary | ICD-10-CM

## 2015-09-16 DIAGNOSIS — M810 Age-related osteoporosis without current pathological fracture: Secondary | ICD-10-CM

## 2015-09-16 DIAGNOSIS — N183 Chronic kidney disease, stage 3 unspecified: Secondary | ICD-10-CM | POA: Insufficient documentation

## 2015-09-16 NOTE — Progress Notes (Signed)
Patient ID: [name], female   DOB: [birth year], 82 y.o.   MRN: [id number]     [facility]   PCP: Garlan Fillers, MD  Code Status: Full Code  Allergies  Allergen Reactions  . Latex Rash    Chief Complaint  Patient presents with  . Medical Management of Chronic Issues    Routine Visit     HPI:  82 y.o. patient is seen for routine visit. She has medical history of AS, dementia, obesity. She is seen in her room today. She is in her bed and is in no distress. She participates minimally in conversation. Unable to obtain ROS. As per staff no fever. She had a fall this weekend when she slid out of bed with no apparent injury per staff. She has been having falls. Getting treatment for wound in her leg but keeps removing her dressing and this is making it difficult for the wound to heal. Under total care at present. Per staff, is combative at times. Mostly feeds herself but needs assistance with feeding at times.   Review of Systems:  Unable to obtain  Past Medical History  Diagnosis Date  . DM (diabetes mellitus) (HCC)   . Hyperlipidemia   . HTN (hypertension)   . CAD (coronary artery disease)     s/p NSTEMI 2003 with stent placement OM1.  PTCA with cutting balloon prox LAD. 2005 admission with chest pain, BMS placed to CFX and DES placed in RCA  . Aortic stenosis     moderate to severe  . Anemia   . CRI (chronic renal insufficiency)   . CVA (cerebral infarction)     remote; recovered  . Second degree Mobitz II AV block     s/p PPM 12/2009  . Hx of colonoscopy   . Headache 2003  . GERD (gastroesophageal reflux disease)   . Alzheimer's disease 07/2011    Medications:   Medication List       This list is accurate as of: 09/16/15  5:03 PM.  Always use your most recent med list.               acetaminophen 325 MG tablet  Commonly known as:  TYLENOL  Take 650 mg by mouth every 4 (four) hours as needed.     aspirin EC 81 MG  tablet  Take 81 mg by mouth daily.     atorvastatin 40 MG tablet  Commonly known as:  LIPITOR  daily. Take one tablet by mouth once daily for cholesterol     DECUBI-VITE Caps  Take 1 capsule by mouth daily.     diclofenac sodium 1 % Gel  Commonly known as:  VOLTAREN  Apply 2 g topically 4 (four) times daily as needed (pain).     donepezil 10 MG disintegrating tablet  Commonly known as:  ARICEPT ODT  Take 10 mg by mouth at bedtime.     furosemide 20 MG tablet  Commonly known as:  LASIX  Take 20 mg by mouth daily.     Lavender Oil Oil  S/O- INSOMINIA /AGITATION/ RESTLESSNESS/ ANXIETY- Apply a drop of lavendar oil or [place] Oil behind each ear per standing order. Repeat PRN. Discontinue use if skin irritation occurs.     Melatonin 3 MG Tabs  Take 3 mg by mouth at bedtime.     mirtazapine 15 MG tablet  Commonly known as:  REMERON  Take 15 mg by mouth at bedtime.  PRESCRIPTION MEDICATION  Take 15 mLs by mouth 2 (two) times daily. Elder tonic- 15 ml bid         Physical Exam: Filed Vitals:   09/16/15 1322  BP: 134/62  Pulse: 74  Temp: 98.7 F (37.1 C)  Resp: 20  Height: 5\' 9"  (1.753 m)  Weight: 237 lb (107.502 kg)  SpO2: 95%    General- elderly female, obese, in no acute distress Head- normocephalic, atraumatic Nose- no maxillary or frontal sinus tenderness, no nasal discharge Throat- moist mucus membrane Eyes- no pallor, no icterus, no discharge, normal conjunctiva, normal sclera Neck- no cervical lymphadenopathy, no jugular vein distension Cardiovascular- normal s1,s2, systolic murmur +, bilateral leg edema Respiratory- bilateral clear to auscultation, no wheeze, no rhonchi, no crackles, no use of accessory muscles Abdomen- bowel sounds present, soft, non tender Musculoskeletal- able to move all 4 extremities, has ulcer to right leg Nurological- alert and oriented to person only Skin- warm and dry, right leg open wound Psychiatry- calm this  visit    Labs reviewed: Basic Metabolic Panel:  Recent Labs  16/10/96 0535 05/24/15 0428  06/07/15 2252 06/18/15 08/14/15  NA 141 144  < > 139 144 140  K 3.3* 3.0*  --  3.3* 3.9 4.0  CL 107 108  --  106  --   --   CO2 26 28  --  25  --   --   GLUCOSE 145* 138*  --  91  --   --   BUN 42* 39*  < > 11 17 14   CREATININE 1.54* 1.34*  < > 0.89 15.2* 13.5*  CALCIUM 10.1 9.7  --  9.3  --   --   MG  --  1.8  --   --   --   --   < > = values in this interval not displayed. Liver Function Tests:  Recent Labs  05/17/15 1306 06/07/15 2252 08/14/15  AST 36 30 25  ALT 15 19 26   ALKPHOS 63 77 123  BILITOT 2.1* 1.1  --   PROT 7.8 7.1  --   ALBUMIN 3.4* 2.9*  --    No results for input(s): LIPASE, AMYLASE in the last 8760 hours. No results for input(s): AMMONIA in the last 8760 hours. CBC:  Recent Labs  05/17/15 1306  05/22/15 0535 05/24/15 0428 05/28/15 06/07/15 2252 08/14/15  WBC 9.7  < > 10.9* 10.2 8.8 5.0 5.0  NEUTROABS 7.8*  --   --   --   --  2.4  --   HGB 12.6  < > 11.5* 11.3* 12.0 11.6* 10.1*  HCT 40.1  < > 36.1 35.5* 42 36.8 34*  MCV 84.1  < > 82.0 81.4  --  83.1  --   PLT 135*  < > 242 286 233 155 197  < > = values in this interval not displayed. Cardiac Enzymes:  Recent Labs  05/17/15 1306  TROPONINI 0.08*   Lipid Panel     Component Value Date/Time   CHOL 194 09/19/2012 1025   TRIG 97.0 09/19/2012 1025   HDL 67.40 09/19/2012 1025   CHOLHDL 3 09/19/2012 1025   VLDL 19.4 09/19/2012 1025   LDLCALC 107* 09/19/2012 1025   LDLDIRECT 130.0 11/04/2007 1131    Radiological Exams: Dg Chest 2 View  05/17/2015   CLINICAL DATA:  Decreased PO intake over the past few days. Hypertension.  EXAM: CHEST  2 VIEW  COMPARISON:  11/23/2013  FINDINGS: Left-sided pacemaker unchanged. Lungs are  hypoinflated with subtle right base opacification which may be due to atelectasis or infection. Mild stable cardiomegaly. Remainder the exam is unchanged.  IMPRESSION: Subtle right  base opacification which may be due to atelectasis or infection.  Stable mild cardiomegaly.   Electronically Signed   By: Elberta Fortisaniel  Boyle M.D.   On: 05/17/2015 14:15   Portable Chest 1 View  05/18/2015   CLINICAL DATA:  Community acquired pneumonia.  Follow-up.  EXAM: PORTABLE CHEST - 1 VIEW  COMPARISON:  05/17/2015.  11/23/2013.  FINDINGS: Dual lead pacemaker inserted from a left subclavian approach is unchanged with leads in the right atrium and right ventricle. Atrial lead projection is somewhat different, but I believe this is positional. Heart size is at the upper limits of normal. There is atherosclerosis of the aorta. There is venous hypertension possibly with early interstitial edema. There is mild volume loss at the right base that could be atelectasis or minimal pneumonia. No consolidation or lobar collapse. No visible effusion. Bony structures unremarkable.  IMPRESSION: Suspicion of venous hypertension with early interstitial edema.  Mild volume loss at the right base could be atelectasis or mild atelectatic pneumonia. No dense consolidation or lobar collapse.   Electronically Signed   By: Paulina FusiMark  Shogry M.D.   On: 05/18/2015 08:52    Assessment/Plan  Dementia with behavioral disturbance Per staff has episode of combativeness and refusal of care. Continue aricept. Continue remeron. Get psychiatry consult. Decline anticipated with advanced dementia.   ckd stage 3 Cr 1.04 with gfr > 60% at present. Avoid NSAIDs, monitor BMP q 3 months  Right leg PAD With ulcers, patient not compliant with wound care with dementia interfering with this. Reviewed arterial doppler from 2014 showing significant stenosis. Get vascular consult to evaluate further. Continue aspirin and statin. Continue decubivite. Continue lasix 20 mg daily for edema   Fall initial encounter No apparent injury, combative per staff pleasant at present, afebrile, get cbc with diff, cmp to rule out infection, send u/a as well, lungs  clear on exam. Fall precautions.   Anemia of chronic disease Low but stable Hb, monitor cbc q 3 months  HLD Currently on lipitor 40 mg daily, get lipid panel checked along with a1c  Osteoporosis Seen on xray. Has frequent falls. Start oscal bid for now and monitor  CAD S/p stent in 2003. Chest pain free. Continue aspirin and sttain    Labs/tests ordered: lipid panel, a1c next lab, cbc and cmp in 3 months   Family/ staff Communication: reviewed care plan with patient and nursing supervisor    Oneal GroutMAHIMA Meeka Cartelli, MD  [facility] [phone] (Monday-Friday 8 am - 5 pm) [phone] (afterhours)

## 2015-09-17 LAB — CBC AND DIFFERENTIAL
HEMATOCRIT: 39 % (ref 36–46)
HEMOGLOBIN: 11.2 g/dL — AB (ref 12.0–16.0)
Neutrophils Absolute: 4 /uL
Platelets: 289 10*3/uL (ref 150–399)
WBC: 7.1 10^3/mL

## 2015-09-17 LAB — BASIC METABOLIC PANEL
BUN: 18 mg/dL (ref 4–21)
Creatinine: 0.9 mg/dL (ref 0.5–1.1)
GLUCOSE: 104 mg/dL
POTASSIUM: 4.6 mmol/L (ref 3.4–5.3)
SODIUM: 140 mmol/L (ref 137–147)

## 2015-09-24 LAB — CBC AND DIFFERENTIAL
HCT: 41 % (ref 36–46)
HEMOGLOBIN: 12.1 g/dL (ref 12.0–16.0)
Neutrophils Absolute: 3 /uL
Platelets: 288 10*3/uL (ref 150–399)
WBC: 6.9 10^3/mL

## 2015-09-24 LAB — BASIC METABOLIC PANEL
BUN: 33 mg/dL — AB (ref 4–21)
CREATININE: 1.2 mg/dL — AB (ref 0.5–1.1)
Glucose: 114 mg/dL
POTASSIUM: 4 mmol/L (ref 3.4–5.3)
SODIUM: 147 mmol/L (ref 137–147)

## 2015-10-01 ENCOUNTER — Non-Acute Institutional Stay (SKILLED_NURSING_FACILITY): Payer: Medicare Other | Admitting: Adult Health

## 2015-10-01 ENCOUNTER — Encounter: Payer: Self-pay | Admitting: Adult Health

## 2015-10-01 DIAGNOSIS — E870 Hyperosmolality and hypernatremia: Secondary | ICD-10-CM | POA: Diagnosis not present

## 2015-10-01 DIAGNOSIS — N183 Chronic kidney disease, stage 3 unspecified: Secondary | ICD-10-CM

## 2015-10-01 DIAGNOSIS — R630 Anorexia: Secondary | ICD-10-CM

## 2015-10-01 NOTE — Progress Notes (Signed)
Patient ID: [name], female   DOB: 08-22-1930, 82 y.o.   MRN: [id number]     DATE:  10/01/15   MRN:  [id number]  BIRTHDAY: 02/22/1930  Facility:  Nursing Home Location:  [facility]  Nursing Home Room Number: 203-2  LEVEL OF CARE:  SNF [phone])  Contact Information    Name Relation Home Work [place] Daughter [phone]  [phone]   Chevis Pretty   [phone]   Steva Colder   [phone]       Code Status History    Date Active Date Inactive Code Status Order ID Comments User Context   05/17/2015  7:27 PM 05/24/2015  5:49 PM Full Code [id number]  Jeralyn Bennett, MD Inpatient   11/23/2013  8:36 PM 11/24/2013 11:52 PM Full Code [id number]  Martha Clan, MD Inpatient       Chief Complaint  Patient presents with  . Acute Visit    HISTORY OF PRESENT ILLNESS:  This is an 82 year old female who is being seen due to being sleepy and has very poor appetite. She is currently taking Lasix for BLE edema. She has advanced Alzheimer's Disease. She is full code. Latest NA 147, BUN 33 and creatinine 1.21 .   PAST MEDICAL HISTORY:  Past Medical History  Diagnosis Date  . DM (diabetes mellitus) (HCC)   . Hyperlipidemia   . HTN (hypertension)   . CAD (coronary artery disease)     s/p NSTEMI 2003 with stent placement OM1.  PTCA with cutting balloon prox LAD. 2005 admission with chest pain, BMS placed to CFX and DES placed in RCA  . Aortic stenosis     moderate to severe  . Anemia of chronic disease   . CRI (chronic renal insufficiency)   . CVA (cerebral infarction)     remote; recovered  . Second degree Mobitz II AV block     s/p PPM 12/2009  . Hx of colonoscopy   . Headache 2003  . GERD (gastroesophageal reflux disease)   . Alzheimer's disease 07/2011  . PAD (peripheral artery disease) (HCC)   . Atherosclerosis   . Osteoporosis   . Hyperlipidemia   . Fall (on)(from) incline, initial encounter   . Atherosclerosis of artery       CURRENT MEDICATIONS: Reviewed  Patient's Medications  New Prescriptions   No medications on file  Previous Medications   ACETAMINOPHEN (TYLENOL) 325 MG TABLET    Take 650 mg by mouth every 4 (four) hours as needed.   ASPIRIN EC 81 MG TABLET    Take 81 mg by mouth daily.   ATORVASTATIN (LIPITOR) 40 MG TABLET    Take 40 mg by mouth daily.    CALCIUM CARBONATE (OSCAL) 1500 (600 CA) MG TABS TABLET    Take 600 mg of elemental calcium by mouth 2 (two) times daily with a meal.   DICLOFENAC SODIUM (VOLTAREN) 1 % GEL    Apply 2 g topically 4 (four) times daily as needed (pain).   DONEPEZIL (ARICEPT ODT) 10 MG DISINTEGRATING TABLET    Take 10 mg by mouth at bedtime.   LAVENDER OIL OIL    S/O- INSOMINIA /AGITATION/ RESTLESSNESS/ ANXIETY- Apply a drop of lavendar oil or [place] Oil behind each ear per standing order. Repeat PRN. Discontinue use if skin irritation occurs.   MIRTAZAPINE (REMERON) 15 MG TABLET    Take 15 mg by mouth at bedtime.   MULTIPLE VITAMINS-MINERALS (DECUBI-VITE) CAPS    Take 1 capsule by  mouth daily.   NUTRITIONAL SUPPLEMENTS (NUTRITIONAL SHAKE PO)    Take by mouth 2 (two) times daily. Start Mighty Shake BID with MedPass (2PM, 8PM)   PRESCRIPTION MEDICATION    Take 15 mLs by mouth 2 (two) times daily. Elder tonic- 15 ml bid   PROTEIN (PROCEL 100) POWD    Take 2 scoop by mouth 2 (two) times daily.   UNABLE TO FIND    Med Name: MedPass 2.0 90 mL TID for nutritional support  Modified Medications   No medications on file  Discontinued Medications   FUROSEMIDE (LASIX) 20 MG TABLET    Take 20 mg by mouth daily.   MELATONIN 3 MG TABS    Take 3 mg by mouth at bedtime.     Allergies  Allergen Reactions  . Latex Rash     REVIEW OF SYSTEMS:   Unable to obtain due to advanced Alzheimer's disease.   PHYSICAL EXAMINATION  GENERAL APPEARANCE:  Normal body habitus HEAD: Normal in size and contour. No evidence of trauma EYES: Lids open and close normally. No blepharitis,  entropion or ectropion. PERRL. Conjunctivae are clear and sclerae are white. Lenses are without opacity EARS: Pinnae are normal. Patient hears normal voice tunes of the examiner MOUTH and THROAT: Lips are without lesions. Oral mucosa is dry. Tongue is normal in shape, size, and color and without lesions NECK: supple, trachea midline, no neck masses, no thyroid tenderness, no thyromegaly LYMPHATICS: no LAN in the neck, no supraclavicular LAN RESPIRATORY: breathing is even & unlabored, BS CTAB CARDIAC: RRR, + murmur,no extra heart sounds, no edema GI: abdomen soft, normal BS, no masses, no tenderness, no hepatomegaly, no splenomegaly EXTREMITIES:  Able to move BUE; BLE generalized weakness PSYCHIATRIC: Alert and oriented to person only. Affect and behavior are appropriate  LABS/RADIOLOGY: Labs reviewed: Basic Metabolic Panel:  Recent Labs  16/10/96 0535 05/24/15 0428  06/07/15 2252  09/17/15 09/24/15      NA 141 144  < > 139  < > 140 147      K 3.3* 3.0*  --  3.3*  < > 4.6 4.0      CL 107 108  --  106  --   --   --       CO2 26 28  --  25  --   --   --       GLUCOSE 145* 138*  --  91  --   --   --       BUN 42* 39*  < > 11  < > 18 33*      CREATININE 1.54* 1.34*  < > 0.89  < > 0.9 1.2*      CALCIUM 10.1 9.7  --  9.3  --   --   --       MG  --  1.8  --   --   --   --   --       < > = values in this interval not displayed. Liver Function Tests:  Recent Labs  05/17/15 1306 06/07/15 2252 08/14/15  AST 36 30 25  ALT ALKPHOS 63 77 123  BILITOT 2.1* 1.1  --   PROT 7.8 7.1  --   ALBUMIN 3.4* 2.9*  --    CBC:  Recent Labs  05/22/15 0535 05/24/15 0428  06/07/15 2252  08/30/15 09/17/15 09/24/15  WBC 10.9* 10.2  < > 5.0  < > 4.6 7.1 6.9  NEUTROABS  --   --   --  2.4  --  HGB 11.5* 11.3*  < > 11.6*  < > 10.0* 11.2* 12.1  HCT 36.1 35.5*  < > 36.8  < > 34* 39 41  MCV 82.0 81.4  --  83.1  --   --   --   --   PLT 242 286  < > 155  < > 198 289 288  < > = values in  this interval not displayed.  Cardiac Enzymes:  Recent Labs  05/17/15 1306  TROPONINI 0.08*     ASSESSMENT/PLAN:  Hypernatremia -  Start D5 1/2 NS @ 70 cc/hour X 1L; check BMP after  Chronic kidney disease, stage III - creatinine 1.2; discpntinue Lasix and KCL  Poor appetite - encourage oral fluid intake      MEDINA-VARGAS,MONINA, NP [employer] [phone]

## 2015-10-02 LAB — BASIC METABOLIC PANEL
BUN: 44 mg/dL — AB (ref 4–21)
Creatinine: 1.4 mg/dL — AB (ref 0.5–1.1)
Glucose: 109 mg/dL
Potassium: 4.2 mmol/L (ref 3.4–5.3)
SODIUM: 155 mmol/L — AB (ref 137–147)

## 2015-10-03 LAB — BASIC METABOLIC PANEL
BUN: 50 mg/dL — AB (ref 4–21)
CREATININE: 1.5 mg/dL — AB (ref 0.5–1.1)
GLUCOSE: 145 mg/dL
Sodium: 154 mmol/L — AB (ref 137–147)

## 2015-10-04 LAB — BASIC METABOLIC PANEL
BUN: 43 mg/dL — AB (ref 4–21)
Creatinine: 1.5 mg/dL — AB (ref 0.5–1.1)
Glucose: 138 mg/dL
SODIUM: 154 mmol/L — AB (ref 137–147)

## 2015-10-09 LAB — BASIC METABOLIC PANEL
BUN: 30 mg/dL — AB (ref 4–21)
CREATININE: 1.1 mg/dL (ref 0.5–1.1)
Glucose: 128 mg/dL
Sodium: 155 mmol/L — AB (ref 137–147)

## 2015-10-10 ENCOUNTER — Non-Acute Institutional Stay (SKILLED_NURSING_FACILITY): Payer: Medicare Other | Admitting: Adult Health

## 2015-10-10 ENCOUNTER — Encounter: Payer: Self-pay | Admitting: Adult Health

## 2015-10-10 DIAGNOSIS — N183 Chronic kidney disease, stage 3 unspecified: Secondary | ICD-10-CM

## 2015-10-10 DIAGNOSIS — E46 Unspecified protein-calorie malnutrition: Secondary | ICD-10-CM | POA: Diagnosis not present

## 2015-10-10 DIAGNOSIS — G308 Other Alzheimer's disease: Secondary | ICD-10-CM | POA: Diagnosis not present

## 2015-10-10 DIAGNOSIS — M25561 Pain in right knee: Secondary | ICD-10-CM | POA: Diagnosis not present

## 2015-10-10 DIAGNOSIS — M25562 Pain in left knee: Secondary | ICD-10-CM | POA: Diagnosis not present

## 2015-10-10 DIAGNOSIS — G309 Alzheimer's disease, unspecified: Secondary | ICD-10-CM

## 2015-10-10 DIAGNOSIS — E785 Hyperlipidemia, unspecified: Secondary | ICD-10-CM | POA: Diagnosis not present

## 2015-10-10 DIAGNOSIS — F0281 Dementia in other diseases classified elsewhere with behavioral disturbance: Secondary | ICD-10-CM

## 2015-10-10 DIAGNOSIS — E870 Hyperosmolality and hypernatremia: Secondary | ICD-10-CM | POA: Diagnosis not present

## 2015-10-10 DIAGNOSIS — I739 Peripheral vascular disease, unspecified: Secondary | ICD-10-CM

## 2015-10-10 DIAGNOSIS — R630 Anorexia: Secondary | ICD-10-CM

## 2015-10-10 DIAGNOSIS — G4700 Insomnia, unspecified: Secondary | ICD-10-CM | POA: Diagnosis not present

## 2015-10-10 LAB — BASIC METABOLIC PANEL
BUN: 27 mg/dL — AB (ref 4–21)
Creatinine: 1.2 mg/dL — AB (ref 0.5–1.1)
Glucose: 128 mg/dL
POTASSIUM: 4.1 mmol/L (ref 3.4–5.3)
Sodium: 158 mmol/L — AB (ref 137–147)

## 2015-10-10 NOTE — Progress Notes (Signed)
Patient ID: [name], female   DOB: 11-Dec-1929, 82 y.o.   MRN: [id number]    DATE:  10/10/2015   MRN:  [id number]  BIRTHDAY: Sep 03, 1929  Facility:  Nursing Home Location:  [facility]  Nursing Home Room Number: 203-2  LEVEL OF CARE:  SNF [phone])  Contact Information    Name Relation Home Work [place] Daughter [phone]  [phone]   Chevis Pretty   [phone]   Steva Colder   [phone]       Code Status History    Date Active Date Inactive Code Status Order ID Comments User Context   05/17/2015  7:27 PM 05/24/2015  5:49 PM Full Code [id number]  Jeralyn Bennett, MD Inpatient   11/23/2013  8:36 PM 11/24/2013 11:52 PM Full Code [id number]  Martha Clan, MD Inpatient       Chief Complaint  Patient presents with  . Medical Management of Chronic Issues    HISTORY OF PRESENT ILLNESS:  This is an 82 year old female who is being seen for a routine visit. She is alert to self/person and responsive. She was recently given IV fluids due to hypernatremia. She has advanced Alzheimer's disease and has very poor appetite. Lasix was recently discontinued and bilateral lower extremity edema has subsided. Latest sodium is 155, creatinine has improved from 1.51 to 1.10. Whenever her BLE are touched, she complains of pain. She is a long-term resident of [address].  PAST MEDICAL HISTORY:  Past Medical History  Diagnosis Date  . DM (diabetes mellitus) (HCC)   . Hyperlipidemia   . HTN (hypertension)   . CAD (coronary artery disease)     s/p NSTEMI 2003 with stent placement OM1.  PTCA with cutting balloon prox LAD. 2005 admission with chest pain, BMS placed to CFX and DES placed in RCA  . Aortic stenosis     moderate to severe  . Anemia of chronic disease   . CRI (chronic renal insufficiency)   . CVA (cerebral infarction)     remote; recovered  . Second degree Mobitz II AV block     s/p PPM 12/2009  . Hx of colonoscopy   . Headache  2003  . GERD (gastroesophageal reflux disease)   . Alzheimer's disease 07/2011  . PAD (peripheral artery disease) (HCC)   . Atherosclerosis   . Osteoporosis   . Hyperlipidemia   . Fall (on)(from) incline, initial encounter   . Atherosclerosis of artery      CURRENT MEDICATIONS: Reviewed  Patient's Medications  New Prescriptions   No medications on file  Previous Medications   ACETAMINOPHEN (TYLENOL) 325 MG TABLET    Take 650 mg by mouth every 4 (four) hours as needed.   ASPIRIN EC 81 MG TABLET    Take 81 mg by mouth daily.   ATORVASTATIN (LIPITOR) 40 MG TABLET    Take 40 mg by mouth daily.    CALCIUM CARBONATE (OSCAL) 1500 (600 CA) MG TABS TABLET    Take 600 mg of elemental calcium by mouth 2 (two) times daily with a meal.   DICLOFENAC SODIUM (VOLTAREN) 1 % GEL    Apply 2 g topically 4 (four) times daily as needed (pain).   DONEPEZIL (ARICEPT ODT) 10 MG DISINTEGRATING TABLET    Take 10 mg by mouth at bedtime.   LAVENDER OIL OIL    S/O- INSOMINIA /AGITATION/ RESTLESSNESS/ ANXIETY- Apply a drop of lavendar oil or [place] Oil behind each ear per standing order. Repeat PRN. Discontinue  use if skin irritation occurs.   MIRTAZAPINE (REMERON) 15 MG TABLET    Take 15 mg by mouth at bedtime.   MULTIPLE VITAMINS-MINERALS (DECUBI-VITE) CAPS    Take 1 capsule by mouth daily.   NUTRITIONAL SUPPLEMENTS (NUTRITIONAL SHAKE PO)    Take by mouth 2 (two) times daily. Start Mighty Shake BID with MedPass (2PM, 8PM)   PRESCRIPTION MEDICATION    Take 15 mLs by mouth 2 (two) times daily. Elder tonic- 15 ml bid   PROTEIN (PROCEL 100) POWD    Take 2 scoop by mouth 2 (two) times daily.   UNABLE TO FIND    Med Name: MedPass 2.0 90 mL TID for nutritional support  Modified Medications   No medications on file  Discontinued Medications   No medications on file     Allergies  Allergen Reactions  . Latex Rash     REVIEW OF SYSTEMS:  Unable to obtain due to advanced Alzheimer's disease    PHYSICAL  EXAMINATION  GENERAL APPEARANCE:  In no acute distress. Normal body habitus SKIN:  Skin is warm and dry. There are no suspicious lesions or rash HEAD: Normal in size and contour. No evidence of trauma EYES: Lids open and close normally. No blepharitis, entropion or ectropion. PERRL. Conjunctivae are clear and sclerae are white. Lenses are without opacity EARS: Pinnae are normal. Patient hears normal voice tunes of the examiner MOUTH and THROAT: Lips are without lesions. Oral mucosa is moist and without lesions. Tongue is normal in shape, size, and color and without lesions NECK: supple, trachea midline, no neck masses, no thyroid tenderness, no thyromegaly LYMPHATICS: no LAN in the neck, no supraclavicular LAN RESPIRATORY: breathing is even & unlabored, BS CTAB CARDIAC: RRR, + murmur,no extra heart sounds, no edema GI: abdomen soft, normal BS, no masses, no tenderness, no hepatomegaly, no splenomegaly EXTREMITIES:  Able to move BUE; has generalized weakness BLE PSYCHIATRIC: Alert and oriented to person only. Affect and behavior are appropriate  LABS/RADIOLOGY: Labs reviewed: Basic Metabolic Panel:  Recent Labs  29/56/21 0535 05/24/15 0428  06/07/15 2252  09/17/15 09/24/15 10/02/15 10/03/15 10/04/15 10/09/15  NA 141 144  < > 139  < > 140 147 155* 154* 154* 155*  K 3.3* 3.0*  --  3.3*  < > 4.6 4.0 4.2  --   --   --   CL 107 108  --  106  --   --   --   --   --   --   --   CO2 26 28  --  25  --   --   --   --   --   --   --   GLUCOSE 145* 138*  --  91  --   --   --   --   --   --   --   BUN 42* 39*  < > 11  < > 18 33* 44* 50* 43* 30*  CREATININE 1.54* 1.34*  < > 0.89  < > 0.9 1.2* 1.4* 1.5* 1.5* 1.1  CALCIUM 10.1 9.7  --  9.3  --   --   --   --   --   --   --   MG  --  1.8  --   --   --   --   --   --   --   --   --   < > = values in this interval not displayed. Liver Function Tests:  Recent Labs  05/17/15 1306 06/07/15 2252 08/14/15  AST 36 30 25  ALT ALKPHOS 63 77  123  BILITOT 2.1* 1.1  --   PROT 7.8 7.1  --   ALBUMIN 3.4* 2.9*  --    CBC:  Recent Labs  05/22/15 0535 05/24/15 0428  06/07/15 2252  08/30/15 09/17/15 09/24/15  WBC 10.9* 10.2  < > 5.0  < > 4.6 7.1 6.9  NEUTROABS  --   --   --  2.4  --  HGB 11.5* 11.3*  < > 11.6*  < > 10.0* 11.2* 12.1  HCT 36.1 35.5*  < > 36.8  < > 34* 39 41  MCV 82.0 81.4  --  83.1  --   --   --   --   PLT 242 286  < > 155  < > 198 289 288  < > = values in this interval not displayed.  Cardiac Enzymes:  Recent Labs  05/17/15 1306  TROPONINI 0.08*     ASSESSMENT/PLAN:  Hypernatremia  - NA 155; give D 5 1/2 NS via clysis X 1 L; CMP after  Chronic kidney disease, stage III - creatinine 1.10; will monitor  PAD - complains of lower extremity pain; follow-up with vascular  BLE Pain - continue Tylenol 325 mg take 2 tabs = 650 mg by mouth every 4 hours when necessary for pain and Voltaren gel 1% apply 2 g to BLE 4 times a day when necessary  Insomnia - continue lavender or or [place] 1 behind each ear  at bedtime  Poor appetite - continue Remeron 15 mg 1 tab by mouth daily at bedtime and Eldertonic 15 ML by mouth twice a day  Alzheimer's dementia - continue Aricept 10 mg 1 tab by mouth daily at bedtime  Hyperlipidemia - continue Lipitor 40 mg 1 tab by mouth daily  Protein calorie malnutrition - continue Procel 2 scoops by mouth twice a day    Goals of care:  Long-term care      [facility], NP [facility] [phone]

## 2015-10-14 ENCOUNTER — Encounter (HOSPITAL_COMMUNITY): Payer: Self-pay | Admitting: Emergency Medicine

## 2015-10-14 ENCOUNTER — Emergency Department (HOSPITAL_COMMUNITY): Payer: Medicare Other

## 2015-10-14 ENCOUNTER — Inpatient Hospital Stay (HOSPITAL_COMMUNITY): Payer: Medicare Other

## 2015-10-14 ENCOUNTER — Inpatient Hospital Stay (HOSPITAL_COMMUNITY)
Admission: EM | Admit: 2015-10-14 | Discharge: 2015-10-21 | DRG: 872 | Disposition: A | Discharge status: Skilled Nursing Facility | Payer: Medicare Other | Attending: Internal Medicine | Admitting: Internal Medicine

## 2015-10-14 DIAGNOSIS — E860 Dehydration: Secondary | ICD-10-CM | POA: Diagnosis present

## 2015-10-14 DIAGNOSIS — D696 Thrombocytopenia, unspecified: Secondary | ICD-10-CM | POA: Diagnosis present

## 2015-10-14 DIAGNOSIS — R630 Anorexia: Secondary | ICD-10-CM | POA: Diagnosis present

## 2015-10-14 DIAGNOSIS — E876 Hypokalemia: Secondary | ICD-10-CM | POA: Diagnosis present

## 2015-10-14 DIAGNOSIS — E878 Other disorders of electrolyte and fluid balance, not elsewhere classified: Secondary | ICD-10-CM | POA: Diagnosis present

## 2015-10-14 DIAGNOSIS — Z8249 Family history of ischemic heart disease and other diseases of the circulatory system: Secondary | ICD-10-CM | POA: Diagnosis not present

## 2015-10-14 DIAGNOSIS — E785 Hyperlipidemia, unspecified: Secondary | ICD-10-CM | POA: Diagnosis present

## 2015-10-14 DIAGNOSIS — Z8673 Personal history of transient ischemic attack (TIA), and cerebral infarction without residual deficits: Secondary | ICD-10-CM | POA: Diagnosis not present

## 2015-10-14 DIAGNOSIS — I739 Peripheral vascular disease, unspecified: Secondary | ICD-10-CM | POA: Diagnosis present

## 2015-10-14 DIAGNOSIS — E119 Type 2 diabetes mellitus without complications: Secondary | ICD-10-CM

## 2015-10-14 DIAGNOSIS — L89300 Pressure ulcer of unspecified buttock, unstageable: Secondary | ICD-10-CM | POA: Diagnosis present

## 2015-10-14 DIAGNOSIS — N390 Urinary tract infection, site not specified: Secondary | ICD-10-CM | POA: Diagnosis present

## 2015-10-14 DIAGNOSIS — Z955 Presence of coronary angioplasty implant and graft: Secondary | ICD-10-CM | POA: Diagnosis not present

## 2015-10-14 DIAGNOSIS — F0390 Unspecified dementia without behavioral disturbance: Secondary | ICD-10-CM | POA: Diagnosis present

## 2015-10-14 DIAGNOSIS — M810 Age-related osteoporosis without current pathological fracture: Secondary | ICD-10-CM | POA: Diagnosis present

## 2015-10-14 DIAGNOSIS — I441 Atrioventricular block, second degree: Secondary | ICD-10-CM | POA: Diagnosis present

## 2015-10-14 DIAGNOSIS — F0280 Dementia in other diseases classified elsewhere without behavioral disturbance: Secondary | ICD-10-CM | POA: Diagnosis present

## 2015-10-14 DIAGNOSIS — Z7189 Other specified counseling: Secondary | ICD-10-CM | POA: Diagnosis not present

## 2015-10-14 DIAGNOSIS — N183 Chronic kidney disease, stage 3 unspecified: Secondary | ICD-10-CM | POA: Diagnosis present

## 2015-10-14 DIAGNOSIS — Z800 Family history of malignant neoplasm of digestive organs: Secondary | ICD-10-CM | POA: Diagnosis not present

## 2015-10-14 DIAGNOSIS — Z950 Presence of cardiac pacemaker: Secondary | ICD-10-CM | POA: Diagnosis not present

## 2015-10-14 DIAGNOSIS — N179 Acute kidney failure, unspecified: Secondary | ICD-10-CM | POA: Diagnosis present

## 2015-10-14 DIAGNOSIS — B952 Enterococcus as the cause of diseases classified elsewhere: Secondary | ICD-10-CM | POA: Diagnosis present

## 2015-10-14 DIAGNOSIS — I252 Old myocardial infarction: Secondary | ICD-10-CM

## 2015-10-14 DIAGNOSIS — Z993 Dependence on wheelchair: Secondary | ICD-10-CM | POA: Diagnosis not present

## 2015-10-14 DIAGNOSIS — Z7982 Long term (current) use of aspirin: Secondary | ICD-10-CM | POA: Diagnosis not present

## 2015-10-14 DIAGNOSIS — G309 Alzheimer's disease, unspecified: Secondary | ICD-10-CM | POA: Diagnosis present

## 2015-10-14 DIAGNOSIS — K219 Gastro-esophageal reflux disease without esophagitis: Secondary | ICD-10-CM | POA: Diagnosis present

## 2015-10-14 DIAGNOSIS — I129 Hypertensive chronic kidney disease with stage 1 through stage 4 chronic kidney disease, or unspecified chronic kidney disease: Secondary | ICD-10-CM | POA: Diagnosis present

## 2015-10-14 DIAGNOSIS — R0602 Shortness of breath: Secondary | ICD-10-CM

## 2015-10-14 DIAGNOSIS — Z515 Encounter for palliative care: Secondary | ICD-10-CM | POA: Diagnosis not present

## 2015-10-14 DIAGNOSIS — Z91040 Latex allergy status: Secondary | ICD-10-CM

## 2015-10-14 DIAGNOSIS — I10 Essential (primary) hypertension: Secondary | ICD-10-CM | POA: Diagnosis present

## 2015-10-14 DIAGNOSIS — D638 Anemia in other chronic diseases classified elsewhere: Secondary | ICD-10-CM | POA: Diagnosis present

## 2015-10-14 DIAGNOSIS — Z87891 Personal history of nicotine dependence: Secondary | ICD-10-CM | POA: Diagnosis not present

## 2015-10-14 DIAGNOSIS — Z833 Family history of diabetes mellitus: Secondary | ICD-10-CM | POA: Diagnosis not present

## 2015-10-14 DIAGNOSIS — Z79899 Other long term (current) drug therapy: Secondary | ICD-10-CM

## 2015-10-14 DIAGNOSIS — I2510 Atherosclerotic heart disease of native coronary artery without angina pectoris: Secondary | ICD-10-CM | POA: Diagnosis present

## 2015-10-14 DIAGNOSIS — I455 Other specified heart block: Secondary | ICD-10-CM | POA: Diagnosis present

## 2015-10-14 DIAGNOSIS — I350 Nonrheumatic aortic (valve) stenosis: Secondary | ICD-10-CM | POA: Diagnosis present

## 2015-10-14 DIAGNOSIS — A419 Sepsis, unspecified organism: Principal | ICD-10-CM | POA: Diagnosis present

## 2015-10-14 DIAGNOSIS — R531 Weakness: Secondary | ICD-10-CM | POA: Diagnosis not present

## 2015-10-14 DIAGNOSIS — E870 Hyperosmolality and hypernatremia: Secondary | ICD-10-CM | POA: Diagnosis present

## 2015-10-14 DIAGNOSIS — E1122 Type 2 diabetes mellitus with diabetic chronic kidney disease: Secondary | ICD-10-CM | POA: Diagnosis present

## 2015-10-14 DIAGNOSIS — E1100 Type 2 diabetes mellitus with hyperosmolarity without nonketotic hyperglycemic-hyperosmolar coma (NKHHC): Secondary | ICD-10-CM

## 2015-10-14 DIAGNOSIS — L8990 Pressure ulcer of unspecified site, unspecified stage: Secondary | ICD-10-CM | POA: Diagnosis present

## 2015-10-14 LAB — CBC
HCT: 48.5 % — ABNORMAL HIGH (ref 36.0–46.0)
Hemoglobin: 14.6 g/dL (ref 12.0–15.0)
MCH: 24.8 pg — AB (ref 26.0–34.0)
MCHC: 30.1 g/dL (ref 30.0–36.0)
MCV: 82.3 fL (ref 78.0–100.0)
PLATELETS: 103 10*3/uL — AB (ref 150–400)
RBC: 5.89 MIL/uL — ABNORMAL HIGH (ref 3.87–5.11)
RDW: 16.2 % — AB (ref 11.5–15.5)
WBC: 10.2 10*3/uL (ref 4.0–10.5)

## 2015-10-14 LAB — URINE MICROSCOPIC-ADD ON

## 2015-10-14 LAB — I-STAT CG4 LACTIC ACID, ED: Lactic Acid, Venous: 2.59 mmol/L (ref 0.5–2.0)

## 2015-10-14 LAB — HEPATIC FUNCTION PANEL
ALK PHOS: 89 U/L (ref 38–126)
ALT: 59 U/L — AB (ref 14–54)
AST: 65 U/L — AB (ref 15–41)
Albumin: 2.7 g/dL — ABNORMAL LOW (ref 3.5–5.0)
BILIRUBIN DIRECT: 0.1 mg/dL (ref 0.1–0.5)
BILIRUBIN INDIRECT: 0.7 mg/dL (ref 0.3–0.9)
Total Bilirubin: 0.8 mg/dL (ref 0.3–1.2)
Total Protein: 7.2 g/dL (ref 6.5–8.1)

## 2015-10-14 LAB — BASIC METABOLIC PANEL
ANION GAP: 10 (ref 5–15)
BUN: 32 mg/dL — ABNORMAL HIGH (ref 6–20)
CALCIUM: 11 mg/dL — AB (ref 8.9–10.3)
CO2: 25 mmol/L (ref 22–32)
CREATININE: 1.35 mg/dL — AB (ref 0.44–1.00)
Chloride: 116 mmol/L — ABNORMAL HIGH (ref 101–111)
GFR calc Af Amer: 41 mL/min — ABNORMAL LOW (ref >60)
GFR, EST NON AFRICAN AMERICAN: 35 mL/min — AB (ref >60)
GLUCOSE: 159 mg/dL — AB (ref 65–99)
Potassium: 3.9 mmol/L (ref 3.5–5.1)
Sodium: 151 mmol/L — ABNORMAL HIGH (ref 135–145)

## 2015-10-14 LAB — URINALYSIS, ROUTINE W REFLEX MICROSCOPIC
Bilirubin Urine: NEGATIVE
Glucose, UA: NEGATIVE mg/dL
Ketones, ur: NEGATIVE mg/dL
NITRITE: NEGATIVE
Protein, ur: NEGATIVE mg/dL
Specific Gravity, Urine: 1.025 (ref 1.005–1.030)
pH: 5 (ref 5.0–8.0)

## 2015-10-14 LAB — PROCALCITONIN: PROCALCITONIN: 0.41 ng/mL

## 2015-10-14 LAB — LACTIC ACID, PLASMA: LACTIC ACID, VENOUS: 2.8 mmol/L — AB (ref 0.5–2.0)

## 2015-10-14 MED ORDER — ELDERTONIC PO ELIX
15.0000 mL | ORAL_SOLUTION | Freq: Two times a day (BID) | ORAL | Status: DC
  Administered 2015-10-15 – 2015-10-21 (×13): 15 mL via ORAL
  Filled 2015-10-14 (×15): qty 15

## 2015-10-14 MED ORDER — HEPARIN SODIUM (PORCINE) 5000 UNIT/ML IJ SOLN
5000.0000 [IU] | Freq: Three times a day (TID) | INTRAMUSCULAR | Status: DC
  Administered 2015-10-15: 5000 [IU] via SUBCUTANEOUS
  Filled 2015-10-14 (×5): qty 1

## 2015-10-14 MED ORDER — SODIUM CHLORIDE 0.9 % IV BOLUS (SEPSIS): 500.0000 mL | Freq: Once | INTRAVENOUS | Status: DC

## 2015-10-14 MED ORDER — ONDANSETRON HCL 4 MG/2ML IJ SOLN: 4.0000 mg | Freq: Four times a day (QID) | INTRAMUSCULAR | Status: DC | PRN

## 2015-10-14 MED ORDER — SODIUM CHLORIDE 0.9 % IV SOLN
INTRAVENOUS | Status: DC
  Administered 2015-10-14 – 2015-10-15 (×2): via INTRAVENOUS

## 2015-10-14 MED ORDER — HYDROCODONE-ACETAMINOPHEN 5-325 MG PO TABS: 1.0000 | ORAL_TABLET | ORAL | Status: DC | PRN

## 2015-10-14 MED ORDER — SENNOSIDES-DOCUSATE SODIUM 8.6-50 MG PO TABS: 1.0000 | ORAL_TABLET | Freq: Every evening | ORAL | Status: DC | PRN

## 2015-10-14 MED ORDER — MIRTAZAPINE 15 MG PO TABS
15.0000 mg | ORAL_TABLET | Freq: Every day | ORAL | Status: DC
  Administered 2015-10-16 – 2015-10-20 (×5): 15 mg via ORAL
  Filled 2015-10-14 (×7): qty 1

## 2015-10-14 MED ORDER — RESOURCE INSTANT PROTEIN PO PWD PACKET
6.0000 g | Freq: Two times a day (BID) | ORAL | Status: DC
  Administered 2015-10-15 – 2015-10-21 (×13): 6 g via ORAL
  Filled 2015-10-14 (×15): qty 6

## 2015-10-14 MED ORDER — CEFEPIME HCL 2 G IJ SOLR
2.0000 g | INTRAMUSCULAR | Status: DC
  Administered 2015-10-15: 2 g via INTRAVENOUS
  Filled 2015-10-14: qty 2

## 2015-10-14 MED ORDER — BOOST PO LIQD
Freq: Two times a day (BID) | ORAL | Status: DC
  Administered 2015-10-15 – 2015-10-16 (×3): 237 mL via ORAL
  Filled 2015-10-14 (×4): qty 240

## 2015-10-14 MED ORDER — ACETAMINOPHEN 325 MG PO TABS
650.0000 mg | ORAL_TABLET | ORAL | Status: DC | PRN
  Administered 2015-10-17: 650 mg via ORAL
  Filled 2015-10-14 (×2): qty 2

## 2015-10-14 MED ORDER — ASPIRIN EC 81 MG PO TBEC
81.0000 mg | DELAYED_RELEASE_TABLET | Freq: Every day | ORAL | Status: DC
  Administered 2015-10-15 – 2015-10-21 (×7): 81 mg via ORAL
  Filled 2015-10-14 (×8): qty 1

## 2015-10-14 MED ORDER — DEXTROSE 5 % IV SOLN
1.0000 g | INTRAVENOUS | Status: DC
  Administered 2015-10-14: 1 g via INTRAVENOUS
  Filled 2015-10-14: qty 10

## 2015-10-14 MED ORDER — ONDANSETRON HCL 4 MG PO TABS: 4.0000 mg | ORAL_TABLET | Freq: Four times a day (QID) | ORAL | Status: DC | PRN

## 2015-10-14 MED ORDER — DONEPEZIL HCL 10 MG PO TABS
10.0000 mg | ORAL_TABLET | Freq: Every day | ORAL | Status: DC
  Administered 2015-10-16 – 2015-10-20 (×5): 10 mg via ORAL
  Filled 2015-10-14 (×8): qty 1

## 2015-10-14 MED ORDER — BISACODYL 5 MG PO TBEC: 5.0000 mg | DELAYED_RELEASE_TABLET | Freq: Every day | ORAL | Status: DC | PRN

## 2015-10-14 MED ORDER — SODIUM CHLORIDE 0.9 % IV BOLUS (SEPSIS)
500.0000 mL | Freq: Once | INTRAVENOUS | Status: AC
  Administered 2015-10-14: 500 mL via INTRAVENOUS

## 2015-10-14 MED ORDER — DEXTROSE 5 % IV SOLN
2.0000 g | Freq: Once | INTRAVENOUS | Status: AC
  Administered 2015-10-14: 2 g via INTRAVENOUS
  Filled 2015-10-14: qty 2

## 2015-10-14 MED ORDER — ATORVASTATIN CALCIUM 40 MG PO TABS
40.0000 mg | ORAL_TABLET | Freq: Every day | ORAL | Status: DC
  Administered 2015-10-17 – 2015-10-20 (×4): 40 mg via ORAL
  Filled 2015-10-14 (×8): qty 1

## 2015-10-14 NOTE — Progress Notes (Signed)
Pharmacy Antibiotic Note  [name] is a 82 y.o. female admitted on 10/14/2015 with weakness, UTI, sepsis.  Pharmacy has been consulted for Cefepime dosing.  Today, 10/14/2015: Tmax 99.7, WBC 10.2, CrCl ~ 38 ml/min  Plan:  Cefepime 2g IV q24 hrs  Follow up renal fxn, culture results, and clinical course.      Temp (24hrs), Avg:98.6 F (37 C), Min:97.5 F (36.4 C), Max:99.7 F (37.6 C)   Recent Labs Lab 10/09/15 10/14/15 1525 10/14/15 1725  WBC  --  10.2  --   CREATININE 1.1 1.35*  --   LATICACIDVEN  --   --  2.59*    Estimated Creatinine Clearance: 38.4 mL/min (by C-G formula based on Cr of 1.35).    Allergies  Allergen Reactions  . Latex Rash    Antimicrobials this admission: 2/13 >> Ceftriaxone x1 2/13 >> Cefepime >>  Dose adjustments this admission: None  Microbiology results: 2/13 BCx: sent 2/13 UCx: sent   Thank you for allowing pharmacy to be a part of this patient's care.  Lynann Beaver PharmD, BCPS Pager [phone] 10/14/2015 9:07 PM

## 2015-10-14 NOTE — ED Provider Notes (Signed)
CSN: [id number]     Arrival date & time 10/14/15  1449 History   First MD Initiated Contact with Patient 10/14/15 1542     Chief Complaint  Patient presents with  . Weakness     (Consider location/radiation/quality/duration/timing/severity/associated sxs/prior Treatment) HPI Comments: Pt here w/ increased weakness x 1 week Being tx for hypernatremia and dehydration at NH with iv fluids but has been pulling out her iv line No reported fever, vomiting, diarrhea No cough noted Anorexia has increased Pt transported here by EMS   Patient is a 82 y.o. female presenting with weakness. The history is provided by medical records, the EMS personnel and the nursing home. The history is limited by the condition of the patient.  Weakness    Past Medical History  Diagnosis Date  . DM (diabetes mellitus) (HCC)   . Hyperlipidemia   . HTN (hypertension)   . CAD (coronary artery disease)     s/p NSTEMI 2003 with stent placement OM1.  PTCA with cutting balloon prox LAD. 2005 admission with chest pain, BMS placed to CFX and DES placed in RCA  . Aortic stenosis     moderate to severe  . Anemia of chronic disease   . CRI (chronic renal insufficiency)   . CVA (cerebral infarction)     remote; recovered  . Second degree Mobitz II AV block     s/p PPM 12/2009  . Hx of colonoscopy   . Headache 2003  . GERD (gastroesophageal reflux disease)   . Alzheimer's disease 07/2011  . PAD (peripheral artery disease) (HCC)   . Atherosclerosis   . Osteoporosis   . Hyperlipidemia   . Fall (on)(from) incline, initial encounter   . Atherosclerosis of artery    Past Surgical History  Procedure Laterality Date  . Hysterectomy    . Benign vocal cord tumor removed    . Right knee surgery      x 3 with TKA  . Pacemaker insertion  01/10/2010    [facility] [employer] DR RF implanted by Dr Johney Frame   Family History  Problem Relation Age of Onset  . Coronary artery disease    . Hypertension    . Diabetes Mother   .  Diabetes Father   . Heart disease Mother   . Colon cancer Neg Hx   . Stomach cancer Sister    Social History  Substance Use Topics  . Smoking status: Former Smoker -- 0.50 packs/day for 30 years    Quit date: 08/31/1990  . Smokeless tobacco: Never Used  . Alcohol Use: No   OB History    No data available     Review of Systems  Unable to perform ROS: Dementia  Neurological: Positive for weakness.      Allergies  Latex  Home Medications   Prior to Admission medications   Medication Sig Start Date End Date Taking? Authorizing Provider  acetaminophen (TYLENOL) 325 MG tablet Take 650 mg by mouth every 4 (four) hours as needed for mild pain.    Yes Historical Provider, MD  aspirin EC 81 MG tablet Take 81 mg by mouth daily.   Yes Historical Provider, MD  atorvastatin (LIPITOR) 40 MG tablet Take 40 mg by mouth daily.    Yes Historical Provider, MD  calcium-vitamin D (OSCAL WITH D) 500-200 MG-UNIT tablet Take 1 tablet by mouth 2 (two) times daily.   Yes Historical Provider, MD  diclofenac sodium (VOLTAREN) 1 % GEL Apply 2 g topically 4 (four) times daily as  needed (pain).   Yes Historical Provider, MD  donepezil (ARICEPT ODT) 10 MG disintegrating tablet Take 10 mg by mouth at bedtime.   Yes Historical Provider, MD  geriatric multivitamins-minerals (ELDERTONIC/GEVRABON) ELIX Take 15 mLs by mouth 2 (two) times daily.   Yes Historical Provider, MD  Lavender Oil OIL S/O- INSOMINIA /AGITATION/ RESTLESSNESS/ ANXIETY- Apply a drop of lavendar oil or [place] Oil behind each ear per standing order. Repeat PRN. Discontinue use if skin irritation occurs.   Yes Historical Provider, MD  mirtazapine (REMERON) 15 MG tablet Take 15 mg by mouth at bedtime.   Yes Historical Provider, MD  Multiple Vitamins-Minerals (DECUBI-VITE) CAPS Take 1 capsule by mouth daily.   Yes Historical Provider, MD  Nutritional Supplements (NUTRITIONAL SHAKE PO) Take by mouth 2 (two) times daily. Start Mighty Shake BID with  MedPass (2PM, 8PM)   Yes Historical Provider, MD  Protein (PROCEL 100) POWD Take 2 scoop by mouth 2 (two) times daily.   Yes Historical Provider, MD  UNABLE TO FIND Med Name: MedPass 2.0 90 mL TID for nutritional support   Yes Historical Provider, MD   BP 119/74 mmHg  Pulse 92  Temp(Src) 99.7 F (37.6 C) (Rectal)  Resp 13  SpO2 96% Physical Exam  Constitutional: She appears cachectic.  HENT:  Head: Normocephalic and atraumatic.  Eyes: Conjunctivae, EOM and lids are normal. Pupils are equal, round, and reactive to light.  Neck: Normal range of motion. Neck supple. No tracheal deviation present. No thyroid mass present.  Cardiovascular: Normal rate, regular rhythm and normal heart sounds.  Exam reveals no gallop.   No murmur heard. Pulmonary/Chest: Effort normal and breath sounds normal. No stridor. No respiratory distress. She has no decreased breath sounds. She has no wheezes. She has no rhonchi. She has no rales.  Abdominal: Soft. Normal appearance and bowel sounds are normal. She exhibits no distension. There is no tenderness. There is no rebound and no CVA tenderness.  Musculoskeletal: Normal range of motion. She exhibits no edema or tenderness.  Neurological: She is alert. No cranial nerve deficit or sensory deficit. GCS eye subscore is 4. GCS verbal subscore is 5. GCS motor subscore is 6.  Skin: Skin is warm and dry. No abrasion and no rash noted.  Psychiatric: Her affect is blunt. Her speech is delayed. She is slowed.  Nursing note and vitals reviewed.   ED Course  Procedures (including critical care time) Labs Review Labs Reviewed  BASIC METABOLIC PANEL - Abnormal; Notable for the following:    Sodium 151 (*)    Chloride 116 (*)    Glucose, Bld 159 (*)    BUN 32 (*)    Creatinine, Ser 1.35 (*)    Calcium 11.0 (*)    GFR calc non Af Amer 35 (*)    GFR calc Af Amer 41 (*)    All other components within normal limits  CBC - Abnormal; Notable for the following:    RBC  5.89 (*)    HCT 48.5 (*)    MCH 24.8 (*)    RDW 16.2 (*)    Platelets 103 (*)    All other components within normal limits  URINE CULTURE  URINALYSIS, ROUTINE W REFLEX MICROSCOPIC (NOT AT [facility])  HEPATIC FUNCTION PANEL    Imaging Review No results found. I have personally reviewed and evaluated these images and lab results as part of my medical decision-making.   EKG Interpretation   Date/Time:  Monday October 14 2015 15:27:29 EST Ventricular Rate:  95 PR Interval:  173 QRS Duration: 94 QT Interval:  373 QTC Calculation: 469 R Axis:   -2 Text Interpretation:  Sinus rhythm Ventricular premature complex LVH with  secondary repolarization abnormality Anterior Q waves, possibly due to LVH  Confirmed by Debi Cousin  MD, Ethan Kasperski ([id number]) on 10/14/2015 3:47:11 PM      MDM   Final diagnoses:  SOB (shortness of breath)    Patient started on Rocephin for her UTI will be admitted to the hospital    Lorre Nick, MD 10/14/15 [phone]

## 2015-10-14 NOTE — Progress Notes (Signed)
CSW attempted to speak with patient at bedside. However, patient was not able to stay awake during assessment. Patient continued to doze off. CSW will attempt to speak with patient again later.  [name] [phone] ED CSW 10/14/2015 8:12 PM

## 2015-10-14 NOTE — ED Notes (Signed)
3 staff members attempted lab draws, unsuccessful. Main lab called.

## 2015-10-14 NOTE — H&P (Signed)
[facility] History and Physical  [name] BMW:[id number] DOB: 1930/06/29 DOA: 10/14/2015  Referring physician: ED physician PCP: Garlan Fillers, MD  Specialists:  Clifton James (cardiology)   Chief Complaint:   Weakness, dehydration  HPI: [name] is a 82 y.o. female with PMH of CAD with stents placed in 2005, AV nodal block status post pacer insertion, severe AS, and advanced Alzheimer dementia who presents from her SNF with progressive weakness and dehydration. Patient had been undergoing treatment at her nursing facility for hypernatremia and acute kidney injury in the setting of poor PO intake with treatment complicated by the patient pulling out her IV. After review of nursing facility records, it appears patient has not been febrile, but has progressive appetite loss with concomitant weakness. After failing treatment for dehydration and hypernatremia at her SNF, EMS was activated for transport to the hospital.   ED, patient was found to  be afebrile, saturating well on room air, and with vital signs stable. CBC is notable for sodium of 151 with concomitant hyperchloremia and serum creatinine of 1.35, up from apparent baseline of 1. CBC is notable for an elevated hematocrit and thrombocytopenia with platelet count of 103,000. Urinalysis features many bacteria and large leukocytes and lactic acid returns elevated at 2.59. The patient was bolused with 500 cc NS and treated with an empiric dose of Rocephin after blood and urine cultures have been obtained. She remained hemodynamically stable in the emergency department and will be admitted to the general ward for ongoing evaluation and management of progressive weakness and dehydration with electrolyte disturbances.   Where does patient live?  SNF    Can patient participate in ADLs?  Some   Review of Systems:  Unable to obtain secondary to patient's clinical condition with advanced dementia.    Allergy:  Allergies  Allergen  Reactions  . Latex Rash    Past Medical History  Diagnosis Date  . DM (diabetes mellitus) (HCC)   . Hyperlipidemia   . HTN (hypertension)   . CAD (coronary artery disease)     s/p NSTEMI 2003 with stent placement OM1.  PTCA with cutting balloon prox LAD. 2005 admission with chest pain, BMS placed to CFX and DES placed in RCA  . Aortic stenosis     moderate to severe  . Anemia of chronic disease   . CRI (chronic renal insufficiency)   . CVA (cerebral infarction)     remote; recovered  . Second degree Mobitz II AV block     s/p PPM 12/2009  . Hx of colonoscopy   . Headache 2003  . GERD (gastroesophageal reflux disease)   . Alzheimer's disease 07/2011  . PAD (peripheral artery disease) (HCC)   . Atherosclerosis   . Osteoporosis   . Hyperlipidemia   . Fall (on)(from) incline, initial encounter   . Atherosclerosis of artery     Past Surgical History  Procedure Laterality Date  . Hysterectomy    . Benign vocal cord tumor removed    . Right knee surgery      x 3 with TKA  . Pacemaker insertion  01/10/2010    SJM Accent DR RF implanted by Dr Johney Frame    Social History:  reports that she quit smoking about 25 years ago. She has never used smokeless tobacco. She reports that she does not drink alcohol or use illicit drugs.  Family History:  Family History  Problem Relation Age of Onset  . Coronary artery disease    . Hypertension    .  Diabetes Mother   . Diabetes Father   . Heart disease Mother   . Colon cancer Neg Hx   . Stomach cancer Sister      Prior to Admission medications   Medication Sig Start Date End Date Taking? Authorizing Provider  acetaminophen (TYLENOL) 325 MG tablet Take 650 mg by mouth every 4 (four) hours as needed for mild pain.    Yes Historical Provider, MD  aspirin EC 81 MG tablet Take 81 mg by mouth daily.   Yes Historical Provider, MD  atorvastatin (LIPITOR) 40 MG tablet Take 40 mg by mouth daily.    Yes Historical Provider, MD  calcium-vitamin D  (OSCAL WITH D) 500-200 MG-UNIT tablet Take 1 tablet by mouth 2 (two) times daily.   Yes Historical Provider, MD  diclofenac sodium (VOLTAREN) 1 % GEL Apply 2 g topically 4 (four) times daily as needed (pain).   Yes Historical Provider, MD  donepezil (ARICEPT ODT) 10 MG disintegrating tablet Take 10 mg by mouth at bedtime.   Yes Historical Provider, MD  geriatric multivitamins-minerals (ELDERTONIC/GEVRABON) ELIX Take 15 mLs by mouth 2 (two) times daily.   Yes Historical Provider, MD  Lavender Oil OIL S/O- INSOMINIA /AGITATION/ RESTLESSNESS/ ANXIETY- Apply a drop of lavendar oil or [place] Oil behind each ear per standing order. Repeat PRN. Discontinue use if skin irritation occurs.   Yes Historical Provider, MD  mirtazapine (REMERON) 15 MG tablet Take 15 mg by mouth at bedtime.   Yes Historical Provider, MD  Multiple Vitamins-Minerals (DECUBI-VITE) CAPS Take 1 capsule by mouth daily.   Yes Historical Provider, MD  Nutritional Supplements (NUTRITIONAL SHAKE PO) Take by mouth 2 (two) times daily. Start Mighty Shake BID with MedPass (2PM, 8PM)   Yes Historical Provider, MD  Protein (PROCEL 100) POWD Take 2 scoop by mouth 2 (two) times daily.   Yes Historical Provider, MD  UNABLE TO FIND Med Name: MedPass 2.0 90 mL TID for nutritional support   Yes Historical Provider, MD    Physical Exam: Filed Vitals:   10/14/15 1527 10/14/15 1626 10/14/15 1828 10/14/15 2033  BP: 123/72 119/74 120/73 119/67  Pulse: 94 92 85   Temp: 97.5 F (36.4 C) 99.7 F (37.6 C)    TempSrc: Oral Rectal    Resp: 19 13 17    SpO2: 97% 96% 94%    General: Not in acute distress HEENT:       Eyes: PERRL, EOMI, no scleral icterus or conjunctival pallor.       ENT: No discharge from the ears or nose, no pharyngeal ulcers, petechiae or exudate         Neck: No JVD, no bruit, no appreciable mass Heme: No cervical adenopathy, no pallor Cardiac: S1/S2, RRR, grade IV holosystolic murmur at LSB, No gallops or rubs. Pulm: Good air  movement bilaterally. No rales, wheezing, rhonchi or rubs. Abd: Soft, nondistended, nontender, no rebound pain or gaurding, BS present. Ext: No LE edema bilaterally. 2+DP/PT pulse bilaterally. Musculoskeletal: No gross deformity, no red, hot, swollen joints   Skin: No rashes or wounds on exposed surfaces  Neuro: Lethargic, not verbally communicative, PERRL, EOMI. Brachial reflex 2+ bilaterally. Knee reflex 2+ bilaterally. Negative Babinski's sign. No focal findings Psych: Patient is not overtly psychotic, but exam difficult given lethargy.  Labs on Admission:  Basic Metabolic Panel:  Recent Labs Lab 10/09/15 10/14/15 1525  NA 155* 151*  K  --  3.9  CL  --  116*  CO2  --  25  GLUCOSE  --  159*  BUN 30* 32*  CREATININE 1.1 1.35*  CALCIUM  --  11.0*   Liver Function Tests:  Recent Labs Lab 10/14/15 1529  AST 65*  ALT 59*  ALKPHOS 89  BILITOT 0.8  PROT 7.2  ALBUMIN 2.7*   No results for input(s): LIPASE, AMYLASE in the last 168 hours. No results for input(s): AMMONIA in the last 168 hours. CBC:  Recent Labs Lab 10/14/15 1525  WBC 10.2  HGB 14.6  HCT 48.5*  MCV 82.3  PLT 103*   Cardiac Enzymes: No results for input(s): CKTOTAL, CKMB, CKMBINDEX, TROPONINI in the last 168 hours.  BNP (last 3 results) No results for input(s): BNP in the last 8760 hours.  ProBNP (last 3 results) No results for input(s): PROBNP in the last 8760 hours.  CBG: No results for input(s): GLUCAP in the last 168 hours.  Radiological Exams on Admission: Dg Chest 2 View  10/14/2015  CLINICAL DATA:  Weakness, dementia EXAM: CHEST  2 VIEW COMPARISON:  06/07/2015 FINDINGS: Mild right basilar opacity, likely atelectasis/ scarring. No focal consolidation. No pleural effusion or pneumothorax. Cardiomegaly.  Left subclavian pacemaker. Degenerative changes of the visualized thoracolumbar spine. IMPRESSION: Mild right basilar opacity, likely atelectasis/ scarring. Electronically Signed   By: Charline Bills M.D.   On: 10/14/2015 16:55    EKG: Independently reviewed.  Abnormal findings:  Sinus rhythm, anterior Q-wave  Assessment/Plan  1. UTI  - Bolused with 500 cc NS x3  - Culture sent, will follow-up - Empiric Rocephin given in ED, will continue while awaiting culture data  - Prior urine cultures in EMR with no significant growth    2. Dehydration with hypernatremia  - Sodium 151 on admission, likely secondary to dehydration in setting of acute infection  - Anticipate resolution with fluid resuscitation  - Continue with NS given elevated lactate and dehydration  - Repeat chem panel in the am    3. CKD stage III - SCr 1.35 on arrival, up from apparent baseline of ~1  - Will avoid nephrotoxins where feasible  - IVF hydration  - Repeat chem panel in am   4. Type II DM  - A1c 6.0% on 11/23/13  - Not on diabetic medications at home  - Will check CBGs and institute a SSI if needed    5. HTN  - Normotensive currently  - Will monitor, treat as needed    6. Severe AS  - TTE on 05/23/15 demonstrates severe AS  - Will exercise caution with fluid balance, pt likely preload-dependent      DVT ppx: SQ Heparin     Code Status: Full code Family Communication: None at bed side.                Disposition Plan: Admit to inpatient   Date of Service 10/14/2015    Briscoe Deutscher, MD [facility] Pager [phone]  If 7PM-7AM, please contact night-coverage [email or web address] Password [facility] 10/14/2015, 8:50 PM

## 2015-10-14 NOTE — ED Notes (Signed)
Bed: UJ81 Expected date:  Expected time:  Means of arrival:  Comments: EMS- elderly, weakness

## 2015-10-14 NOTE — ED Notes (Signed)
Per EMS, Pt from [facility], Per facility pt has had a steady decline in health and weakness over the past week. Son sts that she presents today the same way she did yesterday. Pt has hx of dementia.

## 2015-10-15 DIAGNOSIS — D696 Thrombocytopenia, unspecified: Secondary | ICD-10-CM

## 2015-10-15 DIAGNOSIS — L8990 Pressure ulcer of unspecified site, unspecified stage: Secondary | ICD-10-CM | POA: Insufficient documentation

## 2015-10-15 LAB — CBC WITH DIFFERENTIAL/PLATELET
BASOS ABS: 0 10*3/uL (ref 0.0–0.1)
BASOS PCT: 0 %
EOS ABS: 0.1 10*3/uL (ref 0.0–0.7)
Eosinophils Relative: 1 %
HCT: 39.5 % (ref 36.0–46.0)
HEMOGLOBIN: 11.9 g/dL — AB (ref 12.0–15.0)
Lymphocytes Relative: 30 %
Lymphs Abs: 2.4 10*3/uL (ref 0.7–4.0)
MCH: 24.5 pg — ABNORMAL LOW (ref 26.0–34.0)
MCHC: 30.1 g/dL (ref 30.0–36.0)
MCV: 81.4 fL (ref 78.0–100.0)
Monocytes Absolute: 0.5 10*3/uL (ref 0.1–1.0)
Monocytes Relative: 6 %
NEUTROS PCT: 63 %
Neutro Abs: 4.9 10*3/uL (ref 1.7–7.7)
Platelets: 91 10*3/uL — ABNORMAL LOW (ref 150–400)
RBC: 4.85 MIL/uL (ref 3.87–5.11)
RDW: 16 % — ABNORMAL HIGH (ref 11.5–15.5)
WBC: 7.9 10*3/uL (ref 4.0–10.5)

## 2015-10-15 LAB — BASIC METABOLIC PANEL
Anion gap: 14 (ref 5–15)
BUN: 31 mg/dL — AB (ref 6–20)
CHLORIDE: 123 mmol/L — AB (ref 101–111)
CO2: 18 mmol/L — ABNORMAL LOW (ref 22–32)
Calcium: 10 mg/dL (ref 8.9–10.3)
Creatinine, Ser: 1.18 mg/dL — ABNORMAL HIGH (ref 0.44–1.00)
GFR calc Af Amer: 48 mL/min — ABNORMAL LOW (ref >60)
GFR calc non Af Amer: 41 mL/min — ABNORMAL LOW (ref >60)
Glucose, Bld: 111 mg/dL — ABNORMAL HIGH (ref 65–99)
POTASSIUM: 5.4 mmol/L — AB (ref 3.5–5.1)
SODIUM: 155 mmol/L — AB (ref 135–145)

## 2015-10-15 LAB — COMPREHENSIVE METABOLIC PANEL
ALK PHOS: 72 U/L (ref 38–126)
ALT: 48 U/L (ref 14–54)
AST: 51 U/L — ABNORMAL HIGH (ref 15–41)
Albumin: 2.3 g/dL — ABNORMAL LOW (ref 3.5–5.0)
Anion gap: 11 (ref 5–15)
BUN: 28 mg/dL — ABNORMAL HIGH (ref 6–20)
CALCIUM: 10.1 mg/dL (ref 8.9–10.3)
CHLORIDE: 120 mmol/L — AB (ref 101–111)
CO2: 22 mmol/L (ref 22–32)
CREATININE: 1.1 mg/dL — AB (ref 0.44–1.00)
GFR, EST AFRICAN AMERICAN: 52 mL/min — AB (ref >60)
GFR, EST NON AFRICAN AMERICAN: 45 mL/min — AB (ref >60)
Glucose, Bld: 131 mg/dL — ABNORMAL HIGH (ref 65–99)
Potassium: 3.9 mmol/L (ref 3.5–5.1)
Sodium: 153 mmol/L — ABNORMAL HIGH (ref 135–145)
TOTAL PROTEIN: 6.3 g/dL — AB (ref 6.5–8.1)
Total Bilirubin: 0.7 mg/dL (ref 0.3–1.2)

## 2015-10-15 LAB — LACTIC ACID, PLASMA
LACTIC ACID, VENOUS: 2.4 mmol/L — AB (ref 0.5–2.0)
Lactic Acid, Venous: 2.2 mmol/L (ref 0.5–2.0)

## 2015-10-15 LAB — GLUCOSE, CAPILLARY: Glucose-Capillary: 101 mg/dL — ABNORMAL HIGH (ref 65–99)

## 2015-10-15 MED ORDER — DEXTROSE-NACL 5-0.45 % IV SOLN: INTRAVENOUS | Status: DC

## 2015-10-15 MED ORDER — COLLAGENASE 250 UNIT/GM EX OINT
TOPICAL_OINTMENT | Freq: Every day | CUTANEOUS | Status: DC
  Administered 2015-10-15 – 2015-10-20 (×6): via TOPICAL
  Filled 2015-10-15 (×2): qty 30

## 2015-10-15 MED ORDER — SODIUM CHLORIDE 0.9 % IV BOLUS (SEPSIS)
500.0000 mL | Freq: Once | INTRAVENOUS | Status: AC
  Administered 2015-10-15: 500 mL via INTRAVENOUS

## 2015-10-15 MED ORDER — SODIUM CHLORIDE 0.45 % IV SOLN
INTRAVENOUS | Status: DC
  Administered 2015-10-15: 10:00:00 via INTRAVENOUS
  Administered 2015-10-17: 75 mL/h via INTRAVENOUS

## 2015-10-15 NOTE — Progress Notes (Signed)
CRITICAL VALUE ALERT  Critical value received:  Lactic Acid 2.4  Date of notification:  10/15/15  Time of notification:  0045  Critical value read back: yes  Nurse who received alert:  Geannie Risen, RN  MD notified (1st page):  Merdis Delay, NP  Time of first page:  0110  MD notified (2nd page):  Time of second page:  Responding MD:  Merdis Delay, NP  Time MD responded:  [phone]

## 2015-10-15 NOTE — Consult Note (Signed)
WOC wound consult note Reason for Consult:Unstageable Pressure injury at the intergluteal crease Wound type:pressure Pressure Ulcer POA: Yes Measurement: 4cm x 3cm with depth unable to be determined due to the presence of necrotic tissue Wound NWG:NFAOZ soft eschar Drainage (amount, consistency, odor) scant light yellow on old dressing Periwound:intact. Dressing procedure/placement/frequency:Patient is difficult to turn and reposition, she yells out and attempts to push back against those maneuvers. She is not voiding much at this time, but is incontinence of near-solid stool and cries when cleansed.  Her lips are cracked and dry and she refuses oral care.  For this reason, I will provide bilateral pressure redistribution heel boots, a mattress replacement with low air loss feature and ask the staff to do their best to turn and reposition despite the specialized surface.  I will provide an order for once daily application of an enzymatic debriding agent to soften the eschar so that we can see what the full extent of the wound. WOC nursing team will not follow, but will remain available to this patient, the nursing and medical teams.  Please re-consult as wound reveals its true depth or if assistance is needed in the interim. Thanks, Ladona Mow, MSN, RN, GNP, [facility]  Pager# [phone]

## 2015-10-15 NOTE — Progress Notes (Addendum)
PROGRESS NOTE  [name]:[id number] DOB: May 30, 1930 DOA: 10/14/2015 PCP: Garlan Fillers, MD  Assessment/Plan: UTI  - Culture sent, will follow-up - Empiric Rocephin given in ED, will continue while awaiting culture data  - Prior urine cultures in EMR with no significant growth   Dehydration with hypernatremia  - Anticipate resolution with fluid resuscitation  - 1/2 NS  - BMP q 12  CKD stage III - SCr 1.35 on arrival, up from apparent baseline of ~1  - Will avoid nephrotoxins where feasible  - IVF hydration  - Repeat chem panel in am   Type II DM  - A1c 6.0% on 11/23/13  - Not on diabetic medications at home  - Will check CBGs and institute a SSI if needed   HTN  - Normotensive currently  - Will monitor, treat as needed   Severe AS  - TTE on 05/23/15 demonstrates severe AS  - Will exercise caution with fluid balance, pt likely preload-dependent   Dementia Can get combative  Thrombocytopenia -SCDs -d/c heparin   Wheelchair bound, requires feeding Per family gets combative when touched  Spoke at length with daughter--- no formal MPOA paperwork-- I believe there are 7 siblings total-- all except 1 are in the general area.  Daughter agreeable for palliative care consult and palliative care to follow at SNF.  My recommendations for DNR at minimum were discussed with patient    Code Status: full Family Communication: daughter- [name] Disposition Plan:    Consultants:    Procedures:      HPI/Subjective: Few words  Objective: Filed Vitals:   10/14/15 2325 10/15/15 0520  BP: 115/67 112/64  Pulse: 83 62  Temp: 98.7 F (37.1 C) 98 F (36.7 C)  Resp: 20 16    Intake/Output Summary (Last 24 hours) at 10/15/15 1048 Last data filed at 10/15/15 0520  Gross per 24 hour  Intake      5 ml  Output    350 ml  Net   -345 ml   Filed Weights   10/15/15 0500  Weight: 101.9 kg (224 lb 10.4 oz)     Exam:   General:  Awakens to voice, VERY dry lips  Cardiovascular: rrr  Respiratory: clear  Abdomen: +BS, soft  Musculoskeletal: no edema   Data Reviewed: Basic Metabolic Panel:  Recent Labs Lab 10/09/15 10/14/15 1525 10/15/15 0649  NA 155* 151* 153*  K  --  3.9 3.9  CL  --  116* 120*  CO2  --  25 22  GLUCOSE  --  159* 131*  BUN 30* 32* 28*  CREATININE 1.1 1.35* 1.10*  CALCIUM  --  11.0* 10.1   Liver Function Tests:  Recent Labs Lab 10/14/15 1529 10/15/15 0649  AST 65* 51*  ALT 59* 48  ALKPHOS 89 72  BILITOT 0.8 0.7  PROT 7.2 6.3*  ALBUMIN 2.7* 2.3*   No results for input(s): LIPASE, AMYLASE in the last 168 hours. No results for input(s): AMMONIA in the last 168 hours. CBC:  Recent Labs Lab 10/14/15 1525 10/15/15 0649  WBC 10.2 7.9  NEUTROABS  --  4.9  HGB 14.6 11.9*  HCT 48.5* 39.5  MCV 82.3 81.4  PLT 103* 91*   Cardiac Enzymes: No results for input(s): CKTOTAL, CKMB, CKMBINDEX, TROPONINI in the last 168 hours. BNP (last 3 results) No results for input(s): BNP in the last 8760 hours.  ProBNP (last 3 results) No results for input(s): PROBNP in the last 8760 hours.  CBG:  Recent Labs Lab 10/15/15 0737  GLUCAP 101*    Recent Results (from the past 240 hour(s))  Culture, blood (Routine X 2) w Reflex to ID Panel     Status: None (Preliminary result)   Collection Time: 10/14/15  7:00 PM  Result Value Ref Range Status   Specimen Description BLOOD LEFT ARM  Final   Special Requests   Final    AEROBIC BOTTLE ONLY Performed at [facility]    Culture PENDING  Incomplete   Report Status PENDING  Incomplete     Studies: Dg Chest 2 View  10/14/2015  CLINICAL DATA:  Weakness, dementia EXAM: CHEST  2 VIEW COMPARISON:  06/07/2015 FINDINGS: Mild right basilar opacity, likely atelectasis/ scarring. No focal consolidation. No pleural effusion or pneumothorax. Cardiomegaly.  Left subclavian pacemaker. Degenerative changes of the  visualized thoracolumbar spine. IMPRESSION: Mild right basilar opacity, likely atelectasis/ scarring. Electronically Signed   By: Charline Bills M.D.   On: 10/14/2015 16:55   Dg Chest Port 1 View  10/14/2015  CLINICAL DATA:  Sepsis altered mental status EXAM: PORTABLE CHEST 1 VIEW COMPARISON:  10/14/2015 FINDINGS: Stable mild cardiac enlargement. Stable 2 lead cardiac pacer. Vascular pattern normal. Infiltrate or atelectasis right lower lobe. Left lung clear. Possible tiny right pleural effusion. IMPRESSION: Right lower lobe infiltrate could represent atelectasis or pneumonia/ pneumonitis. Possible associated tiny right pleural effusion. Electronically Signed   By: Esperanza Heir M.D.   On: 10/14/2015 21:25    Scheduled Meds: . aspirin EC  81 mg Oral Daily  . atorvastatin  40 mg Oral Daily  . ceFEPime (MAXIPIME) IV  2 g Intravenous Q24H  . donepezil  10 mg Oral QHS  . geriatric multivitamins-minerals  15 mL Oral BID  . heparin  5,000 Units Subcutaneous 3 times per day  . lactose free nutrition   Oral BID  . mirtazapine  15 mg Oral QHS  . protein supplement  6 g Oral BID  . sodium chloride  500 mL Intravenous Once   Continuous Infusions: . sodium chloride 75 mL/hr at 10/15/15 0955   Antibiotics Given (last 72 hours)    None      Principal Problem:   UTI (lower urinary tract infection) Active Problems:   Type 2 diabetes mellitus (HCC)   Essential hypertension, benign   Aortic stenosis   Senile dementia without behavioral disturbance   Dehydration   CKD (chronic kidney disease) stage 3, GFR 30-59 ml/min   Anemia of chronic disease   Hypernatremia   Thrombocytopenia (HCC)   Complicated UTI (urinary tract infection)    Time spent: 25 min    Elasia Furnish [facility]  [facility] Pager [phone]. If 7PM-7AM, please contact night-coverage at [email or web address], password [facility] 10/15/2015, 10:48 AM  LOS: 1 day

## 2015-10-16 LAB — BASIC METABOLIC PANEL
ANION GAP: 8 (ref 5–15)
ANION GAP: 11 (ref 5–15)
BUN: 24 mg/dL — ABNORMAL HIGH (ref 6–20)
BUN: 29 mg/dL — ABNORMAL HIGH (ref 6–20)
CALCIUM: 9.8 mg/dL (ref 8.9–10.3)
CALCIUM: 10.1 mg/dL (ref 8.9–10.3)
CO2: 23 mmol/L (ref 22–32)
CO2: 22 mmol/L (ref 22–32)
Chloride: 122 mmol/L — ABNORMAL HIGH (ref 101–111)
Chloride: 122 mmol/L — ABNORMAL HIGH (ref 101–111)
Creatinine, Ser: 0.72 mg/dL (ref 0.44–1.00)
Creatinine, Ser: 0.82 mg/dL (ref 0.44–1.00)
GLUCOSE: 119 mg/dL — AB (ref 65–99)
GLUCOSE: 155 mg/dL — AB (ref 65–99)
POTASSIUM: 3.4 mmol/L — AB (ref 3.5–5.1)
POTASSIUM: 4 mmol/L (ref 3.5–5.1)
SODIUM: 153 mmol/L — AB (ref 135–145)
SODIUM: 155 mmol/L — AB (ref 135–145)

## 2015-10-16 LAB — GLUCOSE, CAPILLARY: GLUCOSE-CAPILLARY: 119 mg/dL — AB (ref 65–99)

## 2015-10-16 MED ORDER — POTASSIUM CHLORIDE CRYS ER 20 MEQ PO TBCR
40.0000 meq | EXTENDED_RELEASE_TABLET | Freq: Once | ORAL | Status: AC
  Administered 2015-10-16: 40 meq via ORAL
  Filled 2015-10-16: qty 2

## 2015-10-16 MED ORDER — DEXTROSE 5 % IV SOLN
2.0000 g | Freq: Two times a day (BID) | INTRAVENOUS | Status: DC
  Administered 2015-10-16 – 2015-10-17 (×3): 2 g via INTRAVENOUS
  Filled 2015-10-16 (×4): qty 2

## 2015-10-16 MED ORDER — BOOST / RESOURCE BREEZE PO LIQD
237.0000 mL | Freq: Three times a day (TID) | ORAL | Status: DC
  Administered 2015-10-16 – 2015-10-20 (×14): 1 via ORAL

## 2015-10-16 NOTE — Progress Notes (Signed)
Palliative Medicine Team consult was received.   I stopped by to meet with Ms. Schaben.  She was unable to provide history, which appears to be baseline.  No family present.  I called and left a VM for her daughter, [name] at [phone].  We will schedule a meeting with patient and her family at the earliest possible time family can meet and we have a provider available. If there are urgent needs or questions please call [phone]. Thank you for consulting out team to assist with this patients care.  Romie Minus, MD [facility] Palliative Medicine Team [phone]

## 2015-10-16 NOTE — Progress Notes (Signed)
PROGRESS NOTE  [name] ZOX:[id number] DOB: [birth year] DOA: 10/14/2015 PCP: Garlan Fillers, MD  [name] is a 82 y.o. female with PMH of CAD with stents placed in 2005, AV nodal block status post pacer insertion, severe AS, and advanced Alzheimer dementia who presents from her SNF with progressive weakness and dehydration. Patient had been undergoing treatment at her nursing facility for hypernatremia and acute kidney injury in the setting of poor PO intake with treatment complicated by the patient pulling out her IV. After review of nursing facility records, it appears patient has not been febrile, but has progressive appetite loss with concomitant weakness. After failing treatment for dehydration and hypernatremia at her SNF, EMS was activated for transport to the hospital.   Spoke at length with daughter--- no formal MPOA paperwork-- I believe there are 7 siblings total-- all except 1 are in the general area.  Daughter agreeable for palliative care consult and palliative care to follow at SNF.  My recommendations for DNR at minimum were discussed with patient   Assessment/Plan: UTI  - Culture sent, will follow-up - Empiric Rocephin given in ED, will continue while awaiting culture data  - Prior urine cultures in EMR with no significant growth   Blood culture 1/2 gram neg rod -await speciation  Dehydration with hypernatremia  - Anticipate resolution with fluid resuscitation  - 1/2 NS- this fluid has not been started, despite the order placed Tuesday at 9AM - BMP q 12  AKI - SCr 1.35 on arrival, up from apparent baseline of ~1  - Will avoid nephrotoxins where feasible  - IVF hydration   Type II DM  - A1c 6.0% on 11/23/13  - Not on diabetic medications at home  - Will check CBGs and institute a SSI if needed   HTN  - Normotensive currently  - Will monitor, treat as needed   Severe AS  - TTE on 05/23/15 demonstrates severe AS  - Will exercise  caution with fluid balance, pt likely preload-dependent   Dementia Can get combative per family  Thrombocytopenia -SCDs -d/c heparin  Hypokalemia -replete  Wheelchair bound, requires feeding Per family gets combative when touched     Code Status: full Family Communication: daughterRecardo [name] 2/14 Disposition Plan: await palliative care consult   Consultants:    Procedures:      HPI/Subjective: "leave me alone" food tray sitting bedside, untouched   Objective: Filed Vitals:   10/15/15 2100 10/16/15 0531  BP: 97/44 110/49  Pulse: 94 82  Temp: 98.1 F (36.7 C) 98.6 F (37 C)  Resp: 18 18    Intake/Output Summary (Last 24 hours) at 10/16/15 0748 Last data filed at 10/16/15 0600  Gross per 24 hour  Intake 1776.25 ml  Output      0 ml  Net 1776.25 ml   Filed Weights   10/15/15 0500  Weight: 101.9 kg (224 lb 10.4 oz)    Exam:   General:  Sleeping- does not want to be touched  Cardiovascular: rrr, + murmur  Respiratory: clear  Abdomen: +BS, soft  Musculoskeletal: no edema   Data Reviewed: Basic Metabolic Panel:  Recent Labs Lab 10/14/15 1525 10/15/15 0649 10/15/15 2238 10/16/15 0635  NA 151* 153* 155* 153*  K 3.9 3.9 5.4* 3.4*  CL 116* 120* 123* 122*  CO2 25 22 18* 23  GLUCOSE 159* 131* 111* 119*  BUN 32* 28* 31* 24*  CREATININE 1.35* 1.10* 1.18* 0.72  CALCIUM 11.0* 10.1 10.0 9.8  Liver Function Tests:  Recent Labs Lab 10/14/15 1529 10/15/15 0649  AST 65* 51*  ALT 59* 48  ALKPHOS 89 72  BILITOT 0.8 0.7  PROT 7.2 6.3*  ALBUMIN 2.7* 2.3*   No results for input(s): LIPASE, AMYLASE in the last 168 hours. No results for input(s): AMMONIA in the last 168 hours. CBC:  Recent Labs Lab 10/14/15 1525 10/15/15 0649  WBC 10.2 7.9  NEUTROABS  --  4.9  HGB 14.6 11.9*  HCT 48.5* 39.5  MCV 82.3 81.4  PLT 103* 91*   Cardiac Enzymes: No results for input(s): CKTOTAL, CKMB, CKMBINDEX, TROPONINI in the last 168  hours. BNP (last 3 results) No results for input(s): BNP in the last 8760 hours.  ProBNP (last 3 results) No results for input(s): PROBNP in the last 8760 hours.  CBG:  Recent Labs Lab 10/15/15 0737  GLUCAP 101*    Recent Results (from the past 240 hour(s))  Urine culture     Status: None (Preliminary result)   Collection Time: 10/14/15  4:22 PM  Result Value Ref Range Status   Specimen Description URINE, CATHETERIZED  Final   Special Requests NONE  Final   Culture   Final    TOO YOUNG TO READ Performed at [facility]    Report Status PENDING  Incomplete  Culture, blood (Routine X 2) w Reflex to ID Panel     Status: None (Preliminary result)   Collection Time: 10/14/15  5:15 PM  Result Value Ref Range Status   Specimen Description BLOOD RIGHT ANTECUBITAL  Final   Special Requests BOTTLES DRAWN AEROBIC AND ANAEROBIC 5 CC  Final   Culture  Setup Time   Final    GRAM VARIABLE ROD ANAEROBIC BOTTLE ONLY CRITICAL RESULT CALLED TO, READ BACK BY AND VERIFIED WITH: Shelbie Hutching RN 12:20 10/15/15  ([email or web address])    Culture   Final    NO GROWTH < 24 HOURS Performed at [facility]    Report Status PENDING  Incomplete  Culture, blood (Routine X 2) w Reflex to ID Panel     Status: None (Preliminary result)   Collection Time: 10/14/15  7:00 PM  Result Value Ref Range Status   Specimen Description BLOOD LEFT ARM  Final   Special Requests AEROBIC BOTTLE ONLY  Final   Culture   Final    NO GROWTH < 24 HOURS Performed at [facility]    Report Status PENDING  Incomplete     Studies: Dg Chest 2 View  10/14/2015  CLINICAL DATA:  Weakness, dementia EXAM: CHEST  2 VIEW COMPARISON:  06/07/2015 FINDINGS: Mild right basilar opacity, likely atelectasis/ scarring. No focal consolidation. No pleural effusion or pneumothorax. Cardiomegaly.  Left subclavian pacemaker. Degenerative changes of the visualized thoracolumbar spine. IMPRESSION: Mild right basilar opacity, likely  atelectasis/ scarring. Electronically Signed   By: Charline Bills M.D.   On: 10/14/2015 16:55   Dg Chest Port 1 View  10/14/2015  CLINICAL DATA:  Sepsis altered mental status EXAM: PORTABLE CHEST 1 VIEW COMPARISON:  10/14/2015 FINDINGS: Stable mild cardiac enlargement. Stable 2 lead cardiac pacer. Vascular pattern normal. Infiltrate or atelectasis right lower lobe. Left lung clear. Possible tiny right pleural effusion. IMPRESSION: Right lower lobe infiltrate could represent atelectasis or pneumonia/ pneumonitis. Possible associated tiny right pleural effusion. Electronically Signed   By: Esperanza Heir M.D.   On: 10/14/2015 21:25    Scheduled Meds: . aspirin EC  81 mg Oral Daily  . atorvastatin  40 mg Oral Daily  . ceFEPime (MAXIPIME) IV  2 g Intravenous Q24H  . collagenase   Topical Daily  . donepezil  10 mg Oral QHS  . geriatric multivitamins-minerals  15 mL Oral BID  . lactose free nutrition   Oral BID  . mirtazapine  15 mg Oral QHS  . potassium chloride  40 mEq Oral Once  . protein supplement  6 g Oral BID  . sodium chloride  500 mL Intravenous Once   Continuous Infusions: . sodium chloride 75 mL/hr at 10/15/15 0955   Antibiotics Given (last 72 hours)    Date/Time Action Medication Dose Rate   10/15/15 2223 Given   ceFEPIme (MAXIPIME) 2 g in dextrose 5 % 50 mL IVPB 2 g 100 mL/hr      Principal Problem:   UTI (lower urinary tract infection) Active Problems:   Type 2 diabetes mellitus (HCC)   Essential hypertension, benign   Aortic stenosis   Senile dementia without behavioral disturbance   Dehydration   CKD (chronic kidney disease) stage 3, GFR 30-59 ml/min   Anemia of chronic disease   Hypernatremia   Thrombocytopenia (HCC)   Complicated UTI (urinary tract infection)   Pressure ulcer    Time spent: 25 min    Jeannia Tatro [facility]  [facility] Pager [phone]. If 7PM-7AM, please contact night-coverage at [email or web address], password [facility] 10/16/2015, 7:48 AM  LOS:  2 days

## 2015-10-16 NOTE — Clinical Documentation Improvement (Signed)
Hospitalist  Can the diagnosis of pressure ulcer be further specified?   Please include site and stage when documenting pressure ulcer.    Other  Clinically Undetermined    Supporting Information: From wound nurse:  Unstageable Pressure injury at the intergluteal crease. Measurement: 4cm x 3cm with depth unable to be determined due to the presence of necrotic tissue    Please exercise your independent, professional judgment when responding. A specific answer is not anticipated or expected.   Thank Modesta Messing [facility] Health Information Management [place] [phone]

## 2015-10-16 NOTE — Progress Notes (Addendum)
Initial Nutrition Assessment  DOCUMENTATION CODES:   Obesity unspecified  INTERVENTION:   -Continue Boost supplements, each provides 240 kcal and 10g of protein -Continue Beneprotein BID, each scoop provides 25 kcal and 6g of protein -RD to continue to monitor  NUTRITION DIAGNOSIS:   Inadequate oral intake related to lethargy/confusion (dementia) as evidenced by meal completion < 25%.  GOAL:   Patient will meet greater than or equal to 90% of their needs  MONITOR:   PO intake, Supplement acceptance, Labs, Weight trends, Skin, I & O's , GOC  REASON FOR ASSESSMENT:   Malnutrition Screening Tool, Low Braden    ASSESSMENT:   82 y.o. female with PMH of CAD with stents placed in 2005, AV nodal block status post pacer insertion, severe AS, and advanced Alzheimer dementia who presents from her SNF with progressive weakness and dehydration. Patient had been undergoing treatment at her nursing facility for hypernatremia and acute kidney injury in the setting of poor PO intake with treatment complicated by the patient pulling out her IV. After review of nursing facility records, it appears patient has not been febrile, but has progressive appetite loss with concomitant weakness. After failing treatment for dehydration and hypernatremia at her SNF, EMS was activated for transport to the hospital.   Pt in room with no family present. Pt unable to provide history at this time d/t advanced dementia. Pt admitted from nursing facility where she was having poor PO intake per H&P. She has experienced progressive appetite loss and weakness. She is being treated for dehydration. Per medication list, pt was receiving the following supplements at her facility: Mightyshakes BID, 90 ml MedPass 2.0 TID, and 2 scoops of ProCel BID. These supplements provide a total of 984 kcal and 52g of protein daily.  MD has order Boost supplements BID and Beneprotein, 1 scoop BID (provides 530 kcal and 32 g protein daily).  Will increase Boost to provide extra kcal and protein.  Per weight history, pt has lost 13 lb since 1/16 (5% wt loss x 1 month, significant for time frame).  Pt eating very little, ~5% of meal today. SLP evaluated and recommends continue Dysphagia 1 diet with thin liquids. Pt needs full supervision with meals.  Unable to perform NFPE as pt is combative when touched per chart.  Labs reviewed: Elevated Na Low K  Diet Order:  DIET - DYS 1 Room service appropriate?: No; Fluid consistency:: Thin  Skin:  Wound (see comment) (Unstageable sacral ulcer)  Last BM:  2/14  Height:   Ht Readings from Last 1 Encounters:  10/10/15  (1.753 m)    Weight:   Wt Readings from Last 1 Encounters:  10/15/15 224 lb 10.4 oz (101.9 kg)    Ideal Body Weight:  65.9 kg  BMI:  Body mass index is 33.16 kg/(m^2).  Estimated Nutritional Needs:   Kcal:  1900-2100  Protein:  80-90g  Fluid:  2L/day  EDUCATION NEEDS:   No education needs identified at this time  Tilda Franco, MS, RD, LDN Pager: [phone] After Hours Pager: [phone]

## 2015-10-16 NOTE — Evaluation (Signed)
Clinical/Bedside Swallow Evaluation Patient Details  Name: [name] MRN: [id number] Date of Birth: 07-06-1930  Today's Date: 10/16/2015 Time: SLP Start Time (ACUTE ONLY): 1004 SLP Stop Time (ACUTE ONLY): 1034 SLP Time Calculation (min) (ACUTE ONLY): 30 min  Past Medical History:  Past Medical History  Diagnosis Date  . DM (diabetes mellitus) (HCC)   . Hyperlipidemia   . HTN (hypertension)   . CAD (coronary artery disease)     s/p NSTEMI 2003 with stent placement OM1.  PTCA with cutting balloon prox LAD. 2005 admission with chest pain, BMS placed to CFX and DES placed in RCA  . Aortic stenosis     moderate to severe  . Anemia of chronic disease   . CRI (chronic renal insufficiency)   . CVA (cerebral infarction)     remote; recovered  . Second degree Mobitz II AV block     s/p PPM 12/2009  . Hx of colonoscopy   . Headache 2003  . GERD (gastroesophageal reflux disease)   . Alzheimer's disease 07/2011  . PAD (peripheral artery disease) (HCC)   . Atherosclerosis   . Osteoporosis   . Hyperlipidemia   . Fall (on)(from) incline, initial encounter   . Atherosclerosis of artery    Past Surgical History:  Past Surgical History  Procedure Laterality Date  . Hysterectomy    . Benign vocal cord tumor removed    . Right knee surgery      x 3 with TKA  . Pacemaker insertion  01/10/2010    SJM Accent DR RF implanted by Dr Johney Frame   HPI:  82 yo female adm to [facility] with UTI. PMH + for CKD III, HTN, DM2, dementia, dysphagia, GERD, HA.  PSH + for right knee surgery, vocal cord tumor removed. She has small hiatal hernia and reflux diagnosed 09/10/2000.  Swallow eval ordered.  CXR showed right lobe basilar infiltrates.     Assessment / Plan / Recommendation Clinical Impression  Pt presents with improved swallow function compared to prior SLP visit when pt in hospital 05/22/2015.  Poor positioning *due to pt yelling out with movement* and cognitive status impact her swallowing ability.   Reliance on others for feeding will increase aspiration risk.  No indications of airway compromise noted with intake.  Prolonged oral transiting with excessive "mastication" noted likely cognitive based and exacerbated by edentulous status.  No indication of aspiration/airway compromise with po intake.  Recommend continue dys1/thin diet for now, SLP hopeful to speak to family re: pt's premorbid  swallow function/diet, etc.  Will follow up.  Thanks for this consult. .       Aspiration Risk  Mild aspiration risk    Diet Recommendation Dysphagia 1 (Puree);Thin liquid (note possible palliative referral)   Liquid Administration via: Straw;Cup Medication Administration: Whole meds with puree Supervision: Staff to assist with self feeding;Full supervision/cueing for compensatory strategies Compensations: Slow rate;Small sips/bites    Other  Recommendations Oral Care Recommendations: Oral care before and after PO   Follow up Recommendations       Frequency and Duration min 1 x/week  1 week       Prognosis Prognosis for Safe Diet Advancement: Fair Barriers to Reach Goals: Cognitive deficits;Time post onset      Swallow Study   General Date of Onset: 10/16/15 HPI: 82 yo female adm to [facility] with UTI. PMH + for CKD III, HTN, DM2, dementia, dysphagia, GERD, HA.  PSH + for right knee surgery, vocal cord tumor removed. She has  small hiatal hernia and reflux diagnosed 09/10/2000.  Swallow eval ordered.  CXR showed right lobe basilar infiltrates.   Type of Study: Bedside Swallow Evaluation Diet Prior to this Study: Dysphagia 1 (puree);Thin liquids Temperature Spikes Noted: No Respiratory Status: Nasal cannula (pt will not leave oxygen on per rn) History of Recent Intubation: No Behavior/Cognition: Confused;Lethargic/Drowsy;Distractible;Requires cueing Oral Cavity Assessment: Dry Oral Care Completed by SLP: No Oral Cavity - Dentition: Edentulous Self-Feeding Abilities: Total assist Patient  Positioning: Partially reclined (pt on her side, she hollars out when moved, positioned as upright as able) Baseline Vocal Quality: Low vocal intensity Volitional Cough: Cognitively unable to elicit Volitional Swallow: Unable to elicit    Oral/Motor/Sensory Function Overall Oral Motor/Sensory Function:  (pt did not follow directions, appears with generalized weakness without focal deficits)   Ice Chips Ice chips: Not tested   Thin Liquid Thin Liquid: Impaired Presentation: Straw Oral Phase Functional Implications: Prolonged oral transit;Oral holding Pharyngeal  Phase Impairments: Suspected delayed Swallow    Nectar Thick Nectar Thick Liquid: Not tested   Honey Thick Honey Thick Liquid: Not tested   Puree Puree: Impaired Presentation: Spoon Oral Phase Impairments: Reduced lingual movement/coordination Oral Phase Functional Implications: Prolonged oral transit;Oral holding (lingual thrusting) Pharyngeal Phase Impairments: Suspected delayed Swallow   Solid   GO   Solid: Impaired Oral Phase Impairments: Impaired mastication;Reduced lingual movement/coordination Oral Phase Functional Implications: Prolonged oral transit;Impaired mastication;Oral holding Other Comments: use of pudding faciliated clearance of soft solids that pt excessively masticated        Donavan Burnet, MS [facility] SLP [phone]

## 2015-10-16 NOTE — Progress Notes (Signed)
Pharmacy Antibiotic Note  [name] is a 82 y.o. female admitted on 10/14/2015 with weakness, UTI, sepsis.  Pharmacy has been consulted for Cefepime dosing.  Today, 10/16/2015: Tmax afebrile, WBC 7.9, CrCl ~ 67 ml/min  Plan:  Adjust Cefepime 2g IV to q12 hrs  Follow up renal fxn, culture results, and clinical course.   Weight: 224 lb 10.4 oz (101.9 kg)  Temp (24hrs), Avg:98.4 F (36.9 C), Min:98.1 F (36.7 C), Max:98.6 F (37 C)   Recent Labs Lab 10/14/15 1525 10/14/15 1725 10/14/15 2211 10/14/15 2350 10/15/15 0649 10/15/15 2238 10/16/15 0635  WBC 10.2  --   --   --  7.9  --   --   CREATININE 1.35*  --   --   --  1.10* 1.18* 0.72  LATICACIDVEN  --  2.59* 2.8* 2.4* 2.2*  --   --     Estimated Creatinine Clearance: 67.7 mL/min (by C-G formula based on Cr of 0.72).    Allergies  Allergen Reactions  . Latex Rash    Antimicrobials this admission: 2/13 >> Ceftriaxone x1 2/13 >> Cefepime >>  Dose adjustments this admission:   Microbiology results: 2/13 BCx: 1/2 gram variable rod 2/13 UCx: pending  Thank you for allowing pharmacy to be a part of this patient's care.  Arley Phenix RPh 10/16/2015, 8:46 AM Pager [phone]

## 2015-10-16 NOTE — Clinical Documentation Improvement (Signed)
Hospitalist   Would you please help clarify the specific medical condition associated with clinical findings   Sepsis with UTI  Other  Clinically Undetermined  Supporting Information: On admission in ED vital signs were BP 119/74, Pulse 92, Temp 99.7, Resp 13; WBC 10.2; lactic acid 2.59; Know to have dehydration; 1/2 blood cultures with gram variable rods; know to have UTI; started on Cefepime 2 grams IV every 24 hours.  Please exercise your independent, professional judgment when responding. A specific answer is not anticipated or expected.   Thank Modesta Messing [facility] Health Information Management [place] [phone]

## 2015-10-17 DIAGNOSIS — N390 Urinary tract infection, site not specified: Secondary | ICD-10-CM

## 2015-10-17 DIAGNOSIS — I350 Nonrheumatic aortic (valve) stenosis: Secondary | ICD-10-CM

## 2015-10-17 DIAGNOSIS — D638 Anemia in other chronic diseases classified elsewhere: Secondary | ICD-10-CM

## 2015-10-17 LAB — BASIC METABOLIC PANEL
Anion gap: 9 (ref 5–15)
BUN: 23 mg/dL — ABNORMAL HIGH (ref 6–20)
CALCIUM: 9.9 mg/dL (ref 8.9–10.3)
CHLORIDE: 121 mmol/L — AB (ref 101–111)
CO2: 23 mmol/L (ref 22–32)
CREATININE: 0.83 mg/dL (ref 0.44–1.00)
GFR calc non Af Amer: >60 mL/min (ref >60)
GLUCOSE: 143 mg/dL — AB (ref 65–99)
Potassium: 3.9 mmol/L (ref 3.5–5.1)
Sodium: 153 mmol/L — ABNORMAL HIGH (ref 135–145)

## 2015-10-17 LAB — GLUCOSE, CAPILLARY: Glucose-Capillary: 110 mg/dL — ABNORMAL HIGH (ref 65–99)

## 2015-10-17 LAB — CULTURE, BLOOD (ROUTINE X 2)

## 2015-10-17 MED ORDER — DEXTROSE 5 % IV SOLN
INTRAVENOUS | Status: DC
  Administered 2015-10-17: 75 mL via INTRAVENOUS
  Administered 2015-10-19: 08:00:00 via INTRAVENOUS
  Filled 2015-10-17: qty 1000

## 2015-10-17 MED ORDER — LEVOFLOXACIN IN D5W 750 MG/150ML IV SOLN
750.0000 mg | INTRAVENOUS | Status: DC
  Administered 2015-10-17 – 2015-10-20 (×4): 750 mg via INTRAVENOUS
  Filled 2015-10-17 (×4): qty 150

## 2015-10-17 NOTE — Progress Notes (Signed)
Patient ID: LEDORA DELKER, female   DOB: 1930-06-04, 82 y.o.   MRN: [id number]  [facility] PROGRESS NOTE  CHRISTABELLE HANZLIK QMV:[id number] DOB: [birth year] DOA: 10/14/2015 PCP: Garlan Fillers, MD   Brief narrative:    82 y.o. female with PMH of CAD with stents placed in 2005, AV nodal block status post pacer insertion, severe AS, and advanced Alzheimer dementia who presented from SNF with progressive weakness and dehydration. Patient had been undergoing treatment at her nursing facility for hypernatremia and acute kidney injury in the setting of poor PO intake with treatment complicated by the patient pulling out her IV. After failing treatment for dehydration and hypernatremia at her SNF, EMS was activated for transport to the hospital.   Assessment/Plan:    UTI  - Culture sent, currently on Maxipime - pt still febrile with T max 100.9 F - continue to monitor closely and check CBC in AM, if WBC is up, may need further FUO work up - continue Maxipime   Blood culture 1/2 gram neg rod - await speciation  Dehydration with hypernatremia  - Anticipate resolution with fluid resuscitation  - changed fluids to D5 - repeat BMP in AM - pt apparently ate more this AM  AKI - SCr 1.35 on arrival, up from apparent baseline of ~1  - Will avoid nephrotoxins where feasible  - IVF hydration to continue  - repeat BMP in AM  Type II DM  - A1c 6.0% on 11/23/13  - Not on diabetic medications at home, diet controlled   HTN, essential  - Normotensive currently   Severe AS  - TTE on 05/23/15 demonstrates severe AS  - Will exercise caution with fluid balance, pt likely preload-dependent   Dementia - Can get combative per family  Thrombocytopenia - SCDs, cbc in am  Hypokalemia - resolved  DVT prophylaxis - SCD's  Code Status: Full.  Family Communication:  plan of care discussed with pt's son at bedside  Disposition Plan: SNF by 2/18  IV access:  Peripheral  IV  Procedures and diagnostic studies:    Dg Chest 2 View 10/14/2015   Mild right basilar opacity, likely atelectasis/ scarring.   Dg Chest Port 1 View 10/14/2015  Right lower lobe infiltrate could represent atelectasis or pneumonia/ pneumonitis. Possible associated tiny right pleural effusion.   Medical Consultants:  None  Other Consultants:  PT  IAnti-Infectives:   Maxipime 2/13 -->  Debbora Presto, MD  [facility] Pager [phone]  If 7PM-7AM, please contact night-coverage [email or web address] Password [facility] 10/17/2015, 6:28 PM   LOS: 3 days   HPI/Subjective: No events overnight.   Objective: Filed Vitals:   10/16/15 2138 10/17/15 0605 10/17/15 1000 10/17/15 1431  BP: 109/71 104/53 115/60 120/64  Pulse: 105 81 76 79  Temp: 100.9 F (38.3 C) 99.1 F (37.3 C) 98.9 F (37.2 C) 98.6 F (37 C)  TempSrc: Axillary Axillary Oral Oral  Resp: Weight:      SpO2: 95%  98% 99%    Intake/Output Summary (Last 24 hours) at 10/17/15 1828 Last data filed at 10/17/15 1213  Gross per 24 hour  Intake 1673.75 ml  Output      0 ml  Net 1673.75 ml    Exam:   General:  Pt is somnolent but easy to awake, confused   Cardiovascular: Regular rate and rhythm, no rubs, no gallops  Respiratory: diminished breath sounds bilaterally with mild rhonchi at bases R> L  Abdomen: Soft, non tender, non distended,  bowel sounds present, no guarding  Extremities: +1 bilateral LE edema, pulses DP and PT palpable bilaterally   Data Reviewed: Basic Metabolic Panel:  Recent Labs Lab 10/15/15 0649 10/15/15 2238 10/16/15 0635 10/16/15 2025 10/17/15 0635  NA 153* 155* 153* 155* 153*  K 3.9 5.4* 3.4* 4.0 3.9  CL 120* 123* 122* 122* 121*  CO2 22 18* GLUCOSE 131* 111* 119* 155* 143*  BUN 28* 31* 24* 29* 23*  CREATININE 1.10* 1.18* 0.72 0.82 0.83  CALCIUM 10.1 10.0 9.8 10.1 9.9   Liver Function Tests:  Recent Labs Lab 10/14/15 1529 10/15/15 0649  AST 65* 51*  ALT  59* 48  ALKPHOS 89 72  BILITOT 0.8 0.7  PROT 7.2 6.3*  ALBUMIN 2.7* 2.3*   CBC:  Recent Labs Lab 10/14/15 1525 10/15/15 0649  WBC 10.2 7.9  NEUTROABS  --  4.9  HGB 14.6 11.9*  HCT 48.5* 39.5  MCV 82.3 81.4  PLT 103* 91*   CBG:  Recent Labs Lab 10/15/15 0737 10/16/15 0821 10/17/15 0715  GLUCAP 101* 119* 110*    Recent Results (from the past 240 hour(s))  Urine culture     Status: None (Preliminary result)   Collection Time: 10/14/15  4:22 PM  Result Value Ref Range Status   Specimen Description URINE, CATHETERIZED  Final   Special Requests NONE  Final   Culture   Final    >=100,000 COLONIES/mL ENTEROCOCCUS SPECIES >=100,000 COLONIES/mL LACTOBACILLUS SPECIES Standardized susceptibility testing for this organism is not available. Performed at [facility]    Report Status PENDING  Incomplete  Culture, blood (Routine X 2) w Reflex to ID Panel     Status: None   Collection Time: 10/14/15  5:15 PM  Result Value Ref Range Status   Specimen Description BLOOD RIGHT ANTECUBITAL  Final   Special Requests BOTTLES DRAWN AEROBIC AND ANAEROBIC 5 CC  Final   Culture  Setup Time   Final    GRAM VARIABLE ROD ANAEROBIC BOTTLE ONLY CRITICAL RESULT CALLED TO, READ BACK BY AND VERIFIED WITH: Shelbie Hutching RN 12:20 10/15/15  ([email or web address])    Culture   Final    BACILLUS SPECIES Standardized susceptibility testing for this organism is not available. Performed at [facility]    Report Status 10/17/2015 FINAL  Final  Culture, blood (Routine X 2) w Reflex to ID Panel     Status: None (Preliminary result)   Collection Time: 10/14/15  7:00 PM  Result Value Ref Range Status   Specimen Description BLOOD LEFT ARM  Final   Special Requests AEROBIC BOTTLE ONLY  Final   Culture   Final    NO GROWTH 3 DAYS Performed at [facility]    Report Status PENDING  Incomplete     Scheduled Meds: . aspirin EC  81 mg Oral Daily  . atorvastatin  40 mg Oral Daily  . ceFEPime  (MAXIPIME) IV  2 g Intravenous Q12H  . collagenase   Topical Daily  . donepezil  10 mg Oral QHS  . feeding supplement  237 mL Oral TID BM  . geriatric multivitamins-minerals  15 mL Oral BID  . mirtazapine  15 mg Oral QHS  . protein supplement  6 g Oral BID  . sodium chloride  500 mL Intravenous Once   Continuous Infusions: . dextrose 75 mL (10/17/15 1423)

## 2015-10-17 NOTE — Care Management Important Message (Signed)
Important Message  Patient Details  Name: [name] MRN: [id number] Date of Birth: [birth year]   Medicare Important Message Given:  Yes    Haskell Flirt 10/17/2015, 2:27 PMImportant Message  Patient Details  Name: [name] MRN: [id number] Date of Birth: 1930/02/20   Medicare Important Message Given:  Yes    Haskell Flirt 10/17/2015, 2:27 PM

## 2015-10-17 NOTE — Progress Notes (Signed)
Family still not present during attempted visit and still have not returned call to schedule meeting with our team.  I left my card in her room and her nurse will let family know to call to schedule appointment if they come to visit.  Romie Minus, MD [facility] Palliative Medicine Team [phone]

## 2015-10-17 NOTE — Progress Notes (Signed)
Pt sitting up and smiling. Ate 1 container of applesauce and half of jello after only 25% of breakfast.  Pt drank all of ensure, protein powder, and liquid multivitaman.  Sherron Monday

## 2015-10-17 NOTE — Progress Notes (Signed)
CSW following as pt admitted from [facility].   CSW spoke with pt children today and they are in agreement to return to [facility] when pt medically ready.  Pt not yet ready for d/c.  [facility] confirmed that facility can accept pt when medically stable. Pt is a long term resident at SNF.   Full psychosocial assessment to follow.  Loletta Specter, MSW, LCSW Clinical Social Work [phone]

## 2015-10-18 DIAGNOSIS — N183 Chronic kidney disease, stage 3 (moderate): Secondary | ICD-10-CM

## 2015-10-18 DIAGNOSIS — Z515 Encounter for palliative care: Secondary | ICD-10-CM

## 2015-10-18 DIAGNOSIS — Z7189 Other specified counseling: Secondary | ICD-10-CM

## 2015-10-18 DIAGNOSIS — F0390 Unspecified dementia without behavioral disturbance: Secondary | ICD-10-CM

## 2015-10-18 LAB — BASIC METABOLIC PANEL
ANION GAP: 8 (ref 5–15)
BUN: 22 mg/dL — ABNORMAL HIGH (ref 6–20)
CALCIUM: 9.6 mg/dL (ref 8.9–10.3)
CO2: 22 mmol/L (ref 22–32)
CREATININE: 0.74 mg/dL (ref 0.44–1.00)
Chloride: 120 mmol/L — ABNORMAL HIGH (ref 101–111)
GFR calc non Af Amer: >60 mL/min (ref >60)
Glucose, Bld: 122 mg/dL — ABNORMAL HIGH (ref 65–99)
Potassium: 3.3 mmol/L — ABNORMAL LOW (ref 3.5–5.1)
SODIUM: 150 mmol/L — AB (ref 135–145)

## 2015-10-18 LAB — CBC
HCT: 34.9 % — ABNORMAL LOW (ref 36.0–46.0)
Hemoglobin: 10.2 g/dL — ABNORMAL LOW (ref 12.0–15.0)
MCH: 24.1 pg — ABNORMAL LOW (ref 26.0–34.0)
MCHC: 29.2 g/dL — ABNORMAL LOW (ref 30.0–36.0)
MCV: 82.5 fL (ref 78.0–100.0)
PLATELETS: 117 10*3/uL — AB (ref 150–400)
RBC: 4.23 MIL/uL (ref 3.87–5.11)
RDW: 16.3 % — ABNORMAL HIGH (ref 11.5–15.5)
WBC: 7 10*3/uL (ref 4.0–10.5)

## 2015-10-18 LAB — URINE CULTURE: Culture: >=100000

## 2015-10-18 LAB — GLUCOSE, CAPILLARY: GLUCOSE-CAPILLARY: 112 mg/dL — AB (ref 65–99)

## 2015-10-18 MED ORDER — POTASSIUM CHLORIDE CRYS ER 20 MEQ PO TBCR
40.0000 meq | EXTENDED_RELEASE_TABLET | Freq: Once | ORAL | Status: AC
  Administered 2015-10-18: 40 meq via ORAL
  Filled 2015-10-18: qty 2

## 2015-10-18 NOTE — Consult Note (Signed)
Consultation Note Date: 10/18/2015   Patient Name: [name]  DOB: [birth year]  MRN: [id number]  Age / Sex: 82 y.o., female  PCP: Leanna Battles, MD Referring Physician: Theodis Blaze, MD  Reason for Consultation: Establishing goals of care  Clinical Assessment/Narrative: 82 y.o. female with PMH of CAD with stents placed in 2005, AV nodal block status post pacer insertion, severe AS, and advanced Alzheimer dementia who presented from SNF with progressive weakness and dehydration. Patient had been undergoing treatment at her nursing facility for hypernatremia and acute kidney injury in the setting of poor PO intake with treatment complicated by the patient pulling out her IV. After failing treatment for dehydration and hypernatremia at her SNF, EMS was activated for transport to the hospital. She has been improving and plan to transition back to SNF soon.  Palliative consulted for overall GOC.  Contacts/Participants in Discussion: 4 adult children Primary Decision Maker: Her children make decisions collectively HCPOA: No  SUMMARY OF RECOMMENDATIONS - I met with the patient and 4  of her 8  grown children.  We reviewed her clinical course as well as the chronic progressive nature of her underlying dementia and other medical problems. -  We reviewed a MOST form and discussed how to develop plan of care to focus on continuing therapies that would maximize chance of being well enough to return home and limiting therapies not in line with this goal. - Her family was very receptive to the information appreciative of the conversation, however they report they will need time to discuss with her other siblings and did not want to complete a MOST or make any other changes to her care plan at this time. -  family has my card and contact information and will call if they would like for me to follow-up again before she is  discharged in order to continue conversation or complete a most form.    Code Status/Advance Care Planning: Full code    Code Status Orders        Start     Ordered   10/14/15 2047  Full code   Continuous     10/14/15 2050    Code Status History    Date Active Date Inactive Code Status Order ID Comments User Context   05/17/2015  7:27 PM 05/24/2015  5:49 PM Full Code [id number]  Kelvin Cellar, MD Inpatient   11/23/2013  8:36 PM 11/24/2013 11:52 PM Full Code [id number]  Marton Redwood, MD Inpatient      Palliative Prophylaxis:   Aspiration, Bowel Regimen, Delirium Protocol and Frequent Pain Assessment  Psycho-social/Spiritual:  Support System: Strong Additional Recommendations: Caregiving  Support/Resources and Education on Hospice  Prognosis: Unable to determine  Discharge Planning: [facility] for rehab with Palliative care service follow-up   Chief Complaint/ Primary Diagnoses: Present on Admission:  . UTI (lower urinary tract infection) . Senile dementia without behavioral disturbance . Essential hypertension, benign . CKD (chronic kidney disease) stage 3, GFR 30-59 ml/min . Anemia of chronic disease . Hypernatremia . Thrombocytopenia ([place]) . Dehydration . Complicated UTI (urinary tract infection)  I have reviewed the medical record, interviewed the patient and family, and examined the patient. The following aspects are pertinent.  Past Medical History  Diagnosis Date  . DM (diabetes mellitus) ([place])   . Hyperlipidemia   . HTN (hypertension)   . CAD (coronary artery disease)     s/p NSTEMI 2003 with stent placement OM1.  PTCA with cutting balloon prox LAD. 2005  admission with chest pain, BMS placed to CFX and DES placed in RCA  . Aortic stenosis     moderate to severe  . Anemia of chronic disease   . CRI (chronic renal insufficiency)   . CVA (cerebral infarction)     remote; recovered  . Second degree Mobitz II AV block     s/p PPM 12/2009  . Hx  of colonoscopy   . Headache 2003  . GERD (gastroesophageal reflux disease)   . Alzheimer's disease 07/2011  . PAD (peripheral artery disease) ([place])   . Atherosclerosis   . Osteoporosis   . Hyperlipidemia   . Fall (on)(from) incline, initial encounter   . Atherosclerosis of artery    Social History   Social History  . Marital Status: Widowed    Spouse Name: N/A  . Number of Children: 8  . Years of Education: N/A   Social History Main Topics  . Smoking status: Former Smoker -- 0.50 packs/day for 30 years    Quit date: 08/31/1990  . Smokeless tobacco: Never Used  . Alcohol Use: No  . Drug Use: No  . Sexual Activity: Not Asked   Other Topics Concern  . None   Social History Narrative   Patient is a widow and stays with 2 of her sons.   Patient has 8 children.   Patient is retired.   Patient drinks one caffeine drink per day.         Family History  Problem Relation Age of Onset  . Coronary artery disease    . Hypertension    . Diabetes Mother   . Diabetes Father   . Heart disease Mother   . Colon cancer Neg Hx   . Stomach cancer Sister    Scheduled Meds: . aspirin EC  81 mg Oral Daily  . atorvastatin  40 mg Oral Daily  . collagenase   Topical Daily  . donepezil  10 mg Oral QHS  . feeding supplement  237 mL Oral TID BM  . geriatric multivitamins-minerals  15 mL Oral BID  . levofloxacin (LEVAQUIN) IV  750 mg Intravenous Q24H  . mirtazapine  15 mg Oral QHS  . protein supplement  6 g Oral BID  . sodium chloride  500 mL Intravenous Once   Continuous Infusions: . dextrose 50 mL/hr at 10/18/15 1335   PRN Meds:.acetaminophen, bisacodyl, HYDROcodone-acetaminophen, ondansetron **OR** ondansetron (ZOFRAN) IV, senna-docusate Medications Prior to Admission:  Prior to Admission medications   Medication Sig Start Date End Date Taking? Authorizing Provider  acetaminophen (TYLENOL) 325 MG tablet Take 650 mg by mouth every 4 (four) hours as needed for mild pain.    Yes  Historical Provider, MD  aspirin EC 81 MG tablet Take 81 mg by mouth daily.   Yes Historical Provider, MD  atorvastatin (LIPITOR) 40 MG tablet Take 40 mg by mouth daily.    Yes Historical Provider, MD  calcium-vitamin D (OSCAL WITH D) 500-200 MG-UNIT tablet Take 1 tablet by mouth 2 (two) times daily.   Yes Historical Provider, MD  diclofenac sodium (VOLTAREN) 1 % GEL Apply 2 g topically 4 (four) times daily as needed (pain).   Yes Historical Provider, MD  donepezil (ARICEPT ODT) 10 MG disintegrating tablet Take 10 mg by mouth at bedtime.   Yes Historical Provider, MD  geriatric multivitamins-minerals (ELDERTONIC/GEVRABON) ELIX Take 15 mLs by mouth 2 (two) times daily.   Yes Historical Provider, MD  [place] /AGITATION/ RESTLESSNESS/ ANXIETY- Apply a drop  of lavendar oil or [place] Oil behind each ear per standing order. Repeat PRN. Discontinue use if skin irritation occurs.   Yes Historical Provider, MD  mirtazapine (REMERON) 15 MG tablet Take 15 mg by mouth at bedtime.   Yes Historical Provider, MD  Multiple Vitamins-Minerals (DECUBI-VITE) CAPS Take 1 capsule by mouth daily.   Yes Historical Provider, MD  Nutritional Supplements (NUTRITIONAL SHAKE PO) Take by mouth 2 (two) times daily. Start Mighty Shake BID with MedPass (2PM, 8PM)   Yes Historical Provider, MD  Protein (PROCEL 100) POWD Take 2 scoop by mouth 2 (two) times daily.   Yes Historical Provider, MD  UNABLE TO FIND Med Name: MedPass 2.0 90 mL TID for nutritional support   Yes Historical Provider, MD   Allergies  Allergen Reactions  . Latex Rash    Review of Systems  Unable to obtain  Physical Exam  General: Confused, mumbles but unable to answer questions, in no acute distress.  Heart: Regular rate and rhythm. No murmur appreciated. Lungs: Fair air movement, Scattered rhonchi Abdomen: Soft, nontender, mild distended, positive bowel sounds.  Ext: some edema Skin: Warm and dry Neuro: Moves 4  extremities.   Vital Signs: BP 111/48 mmHg  Pulse 88  Temp(Src) 98.5 F (36.9 C) (Oral)  Resp 18  Wt 101.9 kg (224 lb 10.4 oz)  SpO2 100%  SpO2: SpO2: 100 % O2 Device:SpO2: 100 % O2 Flow Rate: .O2 Flow Rate (L/min): 3 L/min  IO: Intake/output summary:  Intake/Output Summary (Last 24 hours) at 10/18/15 2311 Last data filed at 10/18/15 2231  Gross per 24 hour  Intake   1390 ml  Output   1150 ml  Net    240 ml    LBM: Last BM Date: 10/17/15 Baseline Weight: Weight: 101.9 kg (224 lb 10.4 oz) Most recent weight: Weight: 101.9 kg (224 lb 10.4 oz)      Palliative Assessment/Data:  Flowsheet Rows        Most Recent Value   Intake Tab    Referral Department  Hospitalist   Unit at Time of Referral  Med/Surg Unit   Palliative Care Primary Diagnosis  Cardiac   Date Notified  10/15/15   Palliative Care Type  New Palliative care   Reason for referral  Clarify Goals of Care   Date of Admission  10/14/15   Date first seen by Palliative Care  10/17/15   # of days Palliative referral response time  2 Day(s)   # of days IP prior to Palliative referral  1   Clinical Assessment    Palliative Performance Scale Score  40%   Pain Max last 24 hours  Not able to report   Pain Min Last 24 hours  Not able to report   Psychosocial & Spiritual Assessment    Palliative Care Outcomes    Patient/Family meeting held?  Yes   Who was at the meeting?  patient, 4 children   Palliative Care Outcomes  Improved pain interventions   Palliative Care follow-up planned  Yes, Facility      Additional Data Reviewed:  CBC:    Component Value Date/Time   WBC 7.0 10/18/2015 0420   WBC 6.9 09/24/2015   HGB 10.2* 10/18/2015 0420   HCT 34.9* 10/18/2015 0420   PLT 117* 10/18/2015 0420   MCV 82.5 10/18/2015 0420   NEUTROABS 4.9 10/15/2015 0649   NEUTROABS 3 09/24/2015   LYMPHSABS 2.4 10/15/2015 0649   MONOABS 0.5 10/15/2015 0649   EOSABS 0.1 10/15/2015  0649   BASOSABS 0.0 10/15/2015 0649    Comprehensive Metabolic Panel:    Component Value Date/Time   NA 150* 10/18/2015 0420   NA 155* 10/09/2015   K 3.3* 10/18/2015 0420   CL 120* 10/18/2015 0420   CO2 22 10/18/2015 0420   BUN 22* 10/18/2015 0420   BUN 30* 10/09/2015   CREATININE 0.74 10/18/2015 0420   CREATININE 1.1 10/09/2015   GLUCOSE 122* 10/18/2015 0420   CALCIUM 9.6 10/18/2015 0420   CALCIUM 9.7 12/13/2009 1624   AST 51* 10/15/2015 0649   ALT 48 10/15/2015 0649   ALKPHOS 72 10/15/2015 0649   BILITOT 0.7 10/15/2015 0649   PROT 6.3* 10/15/2015 0649   ALBUMIN 2.3* 10/15/2015 0649     Time In: 1630 Time Out: 1725 Time Total: 244 Greater than 50%  of this time was spent counseling and coordinating care related to the above assessment and plan.  Signed by: Micheline Rough, MD  Micheline Rough, MD  10/18/2015, 11:11 PM  Please contact Palliative Medicine Team phone at [phone] for questions and concerns.

## 2015-10-18 NOTE — Progress Notes (Signed)
I spoke with patient's daughter, [name].    She reports that there are 8 children who share decision making.  They will not all be available to meet today.  [name] and I are going to meet at 4:30 PM to talk, however there will not be any changes to long term goals prior to her talking further with her siblings (probably this weekend at the earliest).    Short term goals remains her mother getting well enough to go back to [facility].  Romie Minus, MD [facility] Palliative Medicine Team [phone]

## 2015-10-18 NOTE — Progress Notes (Signed)
10/18/15: Call from lab; they were able to obtain only one blood culture. Dr Izola Price notified vias [facility]ION. Lina Sar, RN

## 2015-10-18 NOTE — NC FL2 (Signed)
[place] MEDICAID FL2 LEVEL OF CARE SCREENING TOOL     IDENTIFICATION  Patient Name: [name] Birthdate: [birth year] Sex: female Admission Date (Current Location): 10/14/2015  [facility] and [place] Number:  Producer, television/film/video and Address:  [facility],  [address] [place] [address]      Provider Number: [id number]  Attending Physician Name and Address:  Dorothea Ogle, MD  Relative Name and Phone Number:       Current Level of Care: Hospital Recommended Level of Care: Skilled Nursing Facility Prior Approval Number:    Date Approved/Denied:   PASRR Number:    Discharge Plan: SNF    Current Diagnoses: Patient Active Problem List   Diagnosis Date Noted  . Pressure ulcer 10/15/2015  . UTI (lower urinary tract infection) 10/14/2015  . Hypernatremia 10/14/2015  . Thrombocytopenia (HCC) 10/14/2015  . Complicated UTI (urinary tract infection) 10/14/2015  . CKD (chronic kidney disease) stage 3, GFR 30-59 ml/min 09/16/2015  . Anemia of chronic disease 09/16/2015  . Alzheimer's disease 09/16/2015  . PAD (peripheral artery disease) (HCC) 09/16/2015  . Depression 08/07/2015  . Aspiration pneumonia (HCC)   . Dehydration   . Dysphagia, pharyngoesophageal phase 05/23/2015  . Obesity (BMI 30-39.9) 05/22/2015  . Senile dementia without behavioral disturbance 05/22/2015  . FTT (failure to thrive) in adult 05/17/2015  . CAP (community acquired pneumonia) 05/17/2015  . AKI (acute kidney injury) (HCC) 05/17/2015  . Stasis leg ulcer (HCC) 11/24/2013    Class: Acute  . SOB (shortness of breath) 11/23/2013  . Altered mental status 11/23/2013  . Edema 11/23/2013  . Shortness of breath 11/23/2013  . Pacemaker-St.Jude 05/18/2012  . CAD (coronary artery disease) 10/07/2009  . CONSTIPATION 09/19/2009  . ARTHRITIS 09/19/2009  . LOSS OF APPETITE 09/19/2009  . CHEST PAIN 09/19/2009  . ANEMIA 09/18/2009  . Type 2 diabetes mellitus (HCC) 07/30/2009  .  HYPERCHOLESTEROLEMIA 07/30/2009  . MITRAL INSUFFICIENCY 07/30/2009  . Second degree AV block 07/30/2009  . SPRAIN&STRAIN OTH SPEC SITES SHOULDER&UPPER ARM 07/30/2009  . Essential hypertension, benign 02/11/2009  . CAD, NATIVE VESSEL 02/11/2009  . Aortic stenosis 02/11/2009    Orientation RESPIRATION BLADDER Height & Weight      (disoriented x 4, memory impairment)  O2 (3L) Incontinent Weight: 224 lb 10.4 oz (101.9 kg) Height:     BEHAVIORAL SYMPTOMS/MOOD NEUROLOGICAL BOWEL NUTRITION STATUS   (n/a)  (NONE) Incontinent Diet (DIET - DYS 1)  AMBULATORY STATUS COMMUNICATION OF NEEDS Skin   Total Care Non-Verbally Other (Comment) (Unstageable Pressure injury at the intergluteal crease: see WOC note)                       Personal Care Assistance Level of Assistance  Bathing, Feeding, Dressing Bathing Assistance: Maximum assistance Feeding assistance: Limited assistance Dressing Assistance: Maximum assistance     Functional Limitations Info  Sight, Hearing, Speech Sight Info: Adequate Hearing Info: Adequate Speech Info: Adequate    SPECIAL CARE FACTORS FREQUENCY  Speech therapy                    Contractures      Additional Factors Info  Code Status Code Status Info: FULL code status             Current Medications (10/18/2015):  This is the current hospital active medication list Current Facility-Administered Medications  Medication Dose Route Frequency Provider Last Rate Last Dose  . acetaminophen (TYLENOL) tablet 650 mg  650  mg Oral Q4H PRN Briscoe Deutscher, MD   650 mg at 10/17/15 2221  . aspirin EC tablet 81 mg  81 mg Oral Daily Briscoe Deutscher, MD   81 mg at 10/18/15 1000  . atorvastatin (LIPITOR) tablet 40 mg  40 mg Oral Daily Briscoe Deutscher, MD   40 mg at 10/17/15 1711  . bisacodyl (DULCOLAX) EC tablet 5 mg  5 mg Oral Daily PRN Briscoe Deutscher, MD      . collagenase (SANTYL) ointment   Topical Daily Joseph Art, DO      . dextrose 5 % solution    Intravenous Continuous Dorothea Ogle, MD 50 mL/hr at 10/18/15 1335    . donepezil (ARICEPT) tablet 10 mg  10 mg Oral QHS Briscoe Deutscher, MD   10 mg at 10/17/15 2221  . feeding supplement (BOOST / RESOURCE BREEZE) liquid 1 Container  237 mL Oral TID BM Joseph Art, DO   1 Container at 10/18/15 1336  . geriatric multivitamins-minerals (ELDERTONIC/GEVRABON) elixir ELIX 15 mL  15 mL Oral BID Briscoe Deutscher, MD   15 mL at 10/18/15 1000  . HYDROcodone-acetaminophen (NORCO/VICODIN) 5-325 MG per tablet 1-2 tablet  1-2 tablet Oral Q4H PRN Briscoe Deutscher, MD      . levofloxacin (LEVAQUIN) IVPB 750 mg  750 mg Intravenous Q24H Clydie Braun, MD   750 mg at 10/17/15 2219  . mirtazapine (REMERON) tablet 15 mg  15 mg Oral QHS Briscoe Deutscher, MD   15 mg at 10/17/15 2221  . ondansetron (ZOFRAN) tablet 4 mg  4 mg Oral Q6H PRN Briscoe Deutscher, MD       Or  . ondansetron (ZOFRAN) injection 4 mg  4 mg Intravenous Q6H PRN Lavone Neri Opyd, MD      . protein supplement (RESOURCE BENEPROTEIN) powder packet 6 g  6 g Oral BID Briscoe Deutscher, MD   6 g at 10/18/15 1000  . senna-docusate (Senokot-S) tablet 1 tablet  1 tablet Oral QHS PRN Lavone Neri Opyd, MD      . sodium chloride 0.9 % bolus 500 mL  500 mL Intravenous Once Briscoe Deutscher, MD   500 mL at 10/17/15 2223     Discharge Medications: Please see discharge summary for a list of discharge medications.  Relevant Imaging Results:  Relevant Lab Results:   Additional Information SSN: [id number]. Wound care to intragluteal Unstageable Pressure injury: Cleanse with NS, apt gently dry. Apply a 1/8 inch layer of collagenase (Santyl). Top with saline moistened gauze 2x2 (opened) and cover with a Sacral soft silicone foam dressing. Change daily and PRN soiling. Mattress Replacement.  Tajuan Dufault A, LCSW

## 2015-10-18 NOTE — Clinical Social Work Note (Signed)
Clinical Social Work Assessment  Patient Details  Name: [name] MRN: [id number] Date of Birth: 1929-12-14  Date of referral:  10/18/15               Reason for consult:  Discharge Planning                Permission sought to share information with:  Family Supports Permission granted to share information::  Yes, Verbal Permission Granted  Name::     [name]  Agency::     Relationship::  daughter  Contact Information:  [phone]  Housing/Transportation Living arrangements for the past 2 months:  Skilled Nursing Facility Source of Information:  Adult Children Patient Interpreter Needed:  None Criminal Activity/Legal Involvement Pertinent to Current Situation/Hospitalization:  No - Comment as needed Significant Relationships:  Adult Children Lives with:  Facility Resident Do you feel safe going back to the place where you live?  Yes Need for family participation in patient care:  Yes (Comment)  Care giving concerns:  Pt admitted from [facility] where pt is long term care pt. Pt daughter does not identify any care giving concerns.   Social Worker assessment / plan:  CSW received referral that pt admitted from [facility].  CSW spoke with pt daughter, [name] who confirms plan is for pt to return. Pt daughter discussed that she has a meeting with palliative care this afternoon. Pt daughter aware of anticipation for discharge tomorrow.   [facility] updated and facility can accept pt when medically ready.  CSW to facilitate pt discharge needs when pt medically ready for discharge.  Employment status:  Retired Health and safety inspector:  Armed forces operational officer, Medicaid In [employer] PT Recommendations:  Not assessed at this time Information / Referral to community resources:  Skilled Nursing Facility  Patient/Family's Response to care:  Pt disoriented x 4 and unable to participate in assessment. Pt daughter and other children involved in pt care. Pt family agreeable to return to  [facility].  Patient/Family's Understanding of and Emotional Response to Diagnosis, Current Treatment, and Prognosis:  Pt daughter was eager to get update when she arrived to hospital today.   Emotional Assessment Appearance:    Attitude/Demeanor/Rapport:  Unable to Assess Affect (typically observed):  Unable to Assess Orientation:   (disoriented x 4) Alcohol / Substance use:  Not Applicable Psych involvement (Current and /or in the community):  No (Comment)  Discharge Needs  Concerns to be addressed:  Discharge Planning Concerns Readmission within the last 30 days:  No Current discharge risk:  None Barriers to Discharge:  No Barriers Identified   [name] A, LCSW 10/18/2015, 4:45 PM [phone]

## 2015-10-18 NOTE — Progress Notes (Signed)
Patient ID: [name], female   DOB: Feb 24, 1930, 82 y.o.   MRN: [id number]  [facility] PROGRESS NOTE  CAMALA TALWAR WUJ:[id number] DOB: Jan 20, 1930 DOA: 10/14/2015 PCP: Garlan Fillers, MD   Brief narrative:    82 y.o. female with PMH of CAD with stents placed in 2005, AV nodal block status post pacer insertion, severe AS, and advanced Alzheimer dementia who presented from SNF with progressive weakness and dehydration. Patient had been undergoing treatment at her nursing facility for hypernatremia and acute kidney injury in the setting of poor PO intake with treatment complicated by the patient pulling out her IV. After failing treatment for dehydration and hypernatremia at her SNF, EMS was activated for transport to the hospital.   Assessment/Plan:    Sepsis secondary to lactobacillus and Enterococcus UTI, bacillus bacteremia  - sepsis present on admission with HR > 90, elevated lactic acid, suspected source UTI  - now clear pt has UTI with the two organisms specified above - even though bacillus sp in blood most often represents contamination, in this particular pt it may be actually significant as urine culture is positive for lactobacillus and enterococcus  - source of bacteremia likely from UTI itself  - pt was on Maxipime, changed to Levaquin 2/16 and today is is day #5 of ABX - urine sensitivity report still pending so will not change ABX regimen until we get this back  - pt still febrile with T max 99 F - repeat blood cultures to ensure clearance   Blood culture 1/2 gram neg rod - awaiting final speciation - ABX as noted above   Dehydration with hypernatremia  - Anticipate resolution with fluid resuscitation  - changed fluids to D5 on  2/16 and Na is slowly trending down - pt looks better this AM and will continue same regimen for now and hopefully Na will stabilize in next day or two - repeat BMP in AM  AKI - SCr 1.35 on arrival, up from apparent baseline of  ~1  - Will avoid nephrotoxins where feasible  - Cr remains WNL  - repeat BMP in AM  Type II DM  - A1c 6.0% on 11/23/13  - Not on diabetic medications at home, diet controlled   HTN, essential  - Normotensive currently   Severe AS  - TTE on 05/23/15 demonstrates severe AS  - Will exercise caution with fluid balance, pt likely preload-dependent   Dementia - Can get combative per family but very pleasant this AM   Thrombocytopenia - SCDs, cbc in am  Hypokalemia - supplement and repeat BMP in AM  DVT prophylaxis - SCD's  Code Status: Full.  Family Communication:  plan of care discussed with pt's son at bedside  Disposition Plan: SNF by 2/18  IV access:  Peripheral IV  Procedures and diagnostic studies:    Dg Chest 2 View 10/14/2015   Mild right basilar opacity, likely atelectasis/ scarring.   Dg Chest Port 1 View 10/14/2015  Right lower lobe infiltrate could represent atelectasis or pneumonia/ pneumonitis. Possible associated tiny right pleural effusion.   Medical Consultants:  None  Other Consultants:  PT  IAnti-Infectives:   Maxipime 2/13 --> 2/16 Levaquin 2/16 -->   Debbora Presto, MD  [facility] Pager [phone]  If 7PM-7AM, please contact night-coverage [email or web address] Password [email or web address] 10/18/2015, 10:05 AM   LOS: 4 days   HPI/Subjective: No events overnight.   Objective: Filed Vitals:   10/17/15 1000 10/17/15 1431 10/17/15 2210 10/18/15 0442  BP: 115/60 120/64 113/51  133/63  Pulse: 76 79 69 65  Temp: 98.9 F (37.2 C) 98.6 F (37 C) 99 F (37.2 C) 98.6 F (37 C)  TempSrc: Oral Oral Oral Oral  Resp: 16 16 16 16   Weight:      SpO2: 98% 99% 98% 97%    Intake/Output Summary (Last 24 hours) at 10/18/15 1005 Last data filed at 10/17/15 2300  Gross per 24 hour  Intake 886.25 ml  Output      0 ml  Net 886.25 ml    Exam:   General:  Pt is more alert this AM, oriented to self   Cardiovascular: Regular rate and rhythm, no rubs, no  gallops  Respiratory: diminished breath sounds bilaterally with mild rhonchi at bases R> L  Abdomen: Soft, non tender, non distended, bowel sounds present, no guarding  Extremities: +1 bilateral LE edema, pulses DP and PT palpable bilaterally   Data Reviewed: Basic Metabolic Panel:  Recent Labs Lab 10/15/15 2238 10/16/15 0635 10/16/15 2025 10/17/15 0635 10/18/15 0420  NA 155* 153* 155* 153* 150*  K 5.4* 3.4* 4.0 3.9 3.3*  CL 123* 122* 122* 121* 120*  CO2 18* 23 22 23 22   GLUCOSE 111* 119* 155* 143* 122*  BUN 31* 24* 29* 23* 22*  CREATININE 1.18* 0.72 0.82 0.83 0.74  CALCIUM 10.0 9.8 10.1 9.9 9.6   Liver Function Tests:  Recent Labs Lab 10/14/15 1529 10/15/15 0649  AST 65* 51*  ALT 59* 48  ALKPHOS 89 72  BILITOT 0.8 0.7  PROT 7.2 6.3*  ALBUMIN 2.7* 2.3*   CBC:  Recent Labs Lab 10/14/15 1525 10/15/15 0649 10/18/15 0420  WBC 10.2 7.9 7.0  NEUTROABS  --  4.9  --   HGB 14.6 11.9* 10.2*  HCT 48.5* 39.5 34.9*  MCV 82.3 81.4 82.5  PLT 103* 91* 117*   CBG:  Recent Labs Lab 10/15/15 0737 10/16/15 0821 10/17/15 0715 10/18/15 0741  GLUCAP 101* 119* 110* 112*    Recent Results (from the past 240 hour(s))  Urine culture     Status: None (Preliminary result)   Collection Time: 10/14/15  4:22 PM  Result Value Ref Range Status   Specimen Description URINE, CATHETERIZED  Final   Special Requests NONE  Final   Culture   Final    >=100,000 COLONIES/mL ENTEROCOCCUS SPECIES >=100,000 COLONIES/mL LACTOBACILLUS SPECIES Standardized susceptibility testing for this organism is not available. Performed at [facility]    Report Status PENDING  Incomplete  Culture, blood (Routine X 2) w Reflex to ID Panel     Status: None   Collection Time: 10/14/15  5:15 PM  Result Value Ref Range Status   Specimen Description BLOOD RIGHT ANTECUBITAL  Final   Special Requests BOTTLES DRAWN AEROBIC AND ANAEROBIC 5 CC  Final   Culture  Setup Time   Final    GRAM VARIABLE  ROD ANAEROBIC BOTTLE ONLY CRITICAL RESULT CALLED TO, READ BACK BY AND VERIFIED WITH: Shelbie Hutching RN 12:20 10/15/15  ([email or web address])    Culture   Final    BACILLUS SPECIES Standardized susceptibility testing for this organism is not available. Performed at [facility]    Report Status 10/17/2015 FINAL  Final  Culture, blood (Routine X 2) w Reflex to ID Panel     Status: None (Preliminary result)   Collection Time: 10/14/15  7:00 PM  Result Value Ref Range Status   Specimen Description BLOOD LEFT ARM  Final   Special Requests AEROBIC BOTTLE ONLY  Final   Culture   Final    NO GROWTH 3 DAYS Performed at [facility]    Report Status PENDING  Incomplete     Scheduled Meds: . aspirin EC  81 mg Oral Daily  . atorvastatin  40 mg Oral Daily  . collagenase   Topical Daily  . donepezil  10 mg Oral QHS  . feeding supplement  237 mL Oral TID BM  . geriatric multivitamins-minerals  15 mL Oral BID  . levofloxacin (LEVAQUIN) IV  750 mg Intravenous Q24H  . mirtazapine  15 mg Oral QHS  . potassium chloride  40 mEq Oral Once  . protein supplement  6 g Oral BID  . sodium chloride  500 mL Intravenous Once   Continuous Infusions: . dextrose 75 mL (10/17/15 1423)

## 2015-10-18 NOTE — Progress Notes (Signed)
10/18/15 12:41 PM Dr Izola Price returned call regarding blood culture. She states "it's OK if they just got one blood culture". Lina Sar, RN

## 2015-10-19 LAB — BASIC METABOLIC PANEL WITH GFR
Anion gap: 6 (ref 5–15)
BUN: 17 mg/dL (ref 6–20)
CO2: 22 mmol/L (ref 22–32)
Calcium: 9.2 mg/dL (ref 8.9–10.3)
Chloride: 113 mmol/L — ABNORMAL HIGH (ref 101–111)
Creatinine, Ser: 0.77 mg/dL (ref 0.44–1.00)
GFR calc Af Amer: >60 mL/min
GFR calc non Af Amer: >60 mL/min
Glucose, Bld: 182 mg/dL — ABNORMAL HIGH (ref 65–99)
Potassium: 4 mmol/L (ref 3.5–5.1)
Sodium: 141 mmol/L (ref 135–145)

## 2015-10-19 LAB — CBC
HCT: 32.2 % — ABNORMAL LOW (ref 36.0–46.0)
Hemoglobin: 9.8 g/dL — ABNORMAL LOW (ref 12.0–15.0)
MCH: 24.3 pg — ABNORMAL LOW (ref 26.0–34.0)
MCHC: 30.4 g/dL (ref 30.0–36.0)
MCV: 79.7 fL (ref 78.0–100.0)
Platelets: 128 K/uL — ABNORMAL LOW (ref 150–400)
RBC: 4.04 MIL/uL (ref 3.87–5.11)
RDW: 15.9 % — ABNORMAL HIGH (ref 11.5–15.5)
WBC: 6.9 K/uL (ref 4.0–10.5)

## 2015-10-19 LAB — CULTURE, BLOOD (ROUTINE X 2): Culture: NO GROWTH

## 2015-10-19 LAB — GLUCOSE, CAPILLARY: Glucose-Capillary: 166 mg/dL — ABNORMAL HIGH (ref 65–99)

## 2015-10-19 NOTE — Progress Notes (Signed)
Patient ID: YAZLEEN MOLOCK, female   DOB: [birth year], 82 y.o.   MRN: [id number]  [facility] PROGRESS NOTE  WYNONIA MEDERO WUJ:[id number] DOB: Jan 23, 1930 DOA: 10/14/2015 PCP: Garlan Fillers, MD   Brief narrative:    82 y.o. female with PMH of CAD with stents placed in 2005, AV nodal block status post pacer insertion, severe AS, and advanced Alzheimer dementia who presented from SNF with progressive weakness and dehydration. Patient had been undergoing treatment at her nursing facility for hypernatremia and acute kidney injury in the setting of poor PO intake with treatment complicated by the patient pulling out her IV. After failing treatment for dehydration and hypernatremia at her SNF, EMS was activated for transport to the hospital.   Assessment/Plan:    Sepsis secondary to lactobacillus and Enterococcus UTI, bacillus bacteremia  - sepsis present on admission with HR > 90, elevated lactic acid, suspected source UTI  - now clear pt has UTI with the two organisms specified above - even though bacillus sp in blood most often represents contamination, in this particular pt it may be actually significant as urine culture is positive for lactobacillus and enterococcus  - source of bacteremia likely from UTI itself  - pt was on Maxipime, changed to Levaquin 2/16 and today is is day #6 of ABX - follow up on repeat blood and urine cultures  - pt afebrile in the past 24 hours  Blood culture 1/2 gram neg rod - awaiting final speciation - ABX as noted above   Dehydration with hypernatremia  - changed fluids to D5 on  2/16 and Na is now WNL - pt looks better this AM, try to stop IVF and see how pt does  - repeat BMP in AM  AKI - SCr 1.35 on arrival, up from apparent baseline of ~1  - Will avoid nephrotoxins where feasible  - Cr remains WNL  - repeat BMP in AM  Type II DM  - A1c 6.0% on 11/23/13  - Not on diabetic medications at home, diet controlled   HTN, essential  -  Normotensive currently   Severe AS  - TTE on 05/23/15 demonstrates severe AS  - Will exercise caution with fluid balance, pt likely preload-dependent  - stop IVF today as sodium has stabilized   Dementia - Can get combative per family but very pleasant this AM   Thrombocytopenia - improving   Hypokalemia - supplemented and WNL this AM  DVT prophylaxis - SCD's  Code Status: Full.  Family Communication:  plan of care discussed with pt's son at bedside  Disposition Plan: SNF by 2/20  IV access:  Peripheral IV  Procedures and diagnostic studies:    Dg Chest 2 View 10/14/2015   Mild right basilar opacity, likely atelectasis/ scarring.   Dg Chest Port 1 View 10/14/2015  Right lower lobe infiltrate could represent atelectasis or pneumonia/ pneumonitis. Possible associated tiny right pleural effusion.   Medical Consultants:  None  Other Consultants:  PT  IAnti-Infectives:   Maxipime 2/13 --> 2/16 Levaquin 2/16 -->   Debbora Presto, MD  [facility] Pager [phone]  If 7PM-7AM, please contact night-coverage [email or web address] Password [facility] 10/19/2015, 12:57 PM   LOS: 5 days   HPI/Subjective: No events overnight.   Objective: Filed Vitals:   10/17/15 2210 10/18/15 0442 10/18/15 2159 10/19/15 0612  BP: 113/51 133/63 111/48 98/59  Pulse: 69 65 88 88  Temp: 99 F (37.2 C) 98.6 F (37 C) 98.5 F (36.9 C) 98.2 F (36.8 C)  TempSrc: Oral Oral Oral Oral  Resp: Weight:      SpO2: 98% 97% 100% 99%    Intake/Output Summary (Last 24 hours) at 10/19/15 1257 Last data filed at 10/19/15 1007  Gross per 24 hour  Intake 2035.83 ml  Output   1850 ml  Net 185.83 ml    Exam:   General:  Pt is more alert this AM, oriented to self   Cardiovascular: Regular rate and rhythm, no rubs, no gallops  Respiratory: diminished breath sounds bilaterally with mild rhonchi at bases R> L  Abdomen: Soft, non tender, non distended, bowel sounds present, no  guarding  Extremities: +1 bilateral LE edema, pulses DP and PT palpable bilaterally   Data Reviewed: Basic Metabolic Panel:  Recent Labs Lab 10/16/15 0635 10/16/15 2025 10/17/15 0635 10/18/15 0420 10/19/15 0747  NA 153* 155* 153* 150* 141  K 3.4* 4.0 3.9 3.3* 4.0  CL 122* 122* 121* 120* 113*  CO2 GLUCOSE 119* 155* 143* 122* 182*  BUN 24* 29* 23* 22* 17  CREATININE 0.72 0.82 0.83 0.74 0.77  CALCIUM 9.8 10.1 9.9 9.6 9.2   Liver Function Tests:  Recent Labs Lab 10/14/15 1529 10/15/15 0649  AST 65* 51*  ALT 59* 48  ALKPHOS 89 72  BILITOT 0.8 0.7  PROT 7.2 6.3*  ALBUMIN 2.7* 2.3*   CBC:  Recent Labs Lab 10/14/15 1525 10/15/15 0649 10/18/15 0420 10/19/15 0747  WBC 10.2 7.9 7.0 6.9  NEUTROABS  --  4.9  --   --   HGB 14.6 11.9* 10.2* 9.8*  HCT 48.5* 39.5 34.9* 32.2*  MCV 82.3 81.4 82.5 79.7  PLT 103* 91* 117* 128*   CBG:  Recent Labs Lab 10/15/15 0737 10/16/15 0821 10/17/15 0715 10/18/15 0741 10/19/15 0749  GLUCAP 101* 119* 110* 112* 166*    Recent Results (from the past 240 hour(s))  Urine culture     Status: None   Collection Time: 10/14/15  4:22 PM  Result Value Ref Range Status   Specimen Description URINE, CATHETERIZED  Final   Special Requests NONE  Final   Culture   Final    >=100,000 COLONIES/mL ENTEROCOCCUS SPECIES >=100,000 COLONIES/mL LACTOBACILLUS SPECIES Standardized susceptibility testing for this organism is not available. Performed at [facility]    Report Status 10/18/2015 FINAL  Final   Organism ID, Bacteria ENTEROCOCCUS SPECIES  Final      Susceptibility   Enterococcus species - MIC*    AMPICILLIN <=2 SENSITIVE Sensitive     LEVOFLOXACIN 1 SENSITIVE Sensitive     NITROFURANTOIN <=16 SENSITIVE Sensitive     VANCOMYCIN 1 SENSITIVE Sensitive     * >=100,000 COLONIES/mL ENTEROCOCCUS SPECIES  Culture, blood (Routine X 2) w Reflex to ID Panel     Status: None   Collection Time: 10/14/15  5:15 PM   Result Value Ref Range Status   Specimen Description BLOOD RIGHT ANTECUBITAL  Final   Special Requests BOTTLES DRAWN AEROBIC AND ANAEROBIC 5 CC  Final   Culture  Setup Time   Final    GRAM VARIABLE ROD ANAEROBIC BOTTLE ONLY CRITICAL RESULT CALLED TO, READ BACK BY AND VERIFIED WITH: Shelbie Hutching RN 12:20 10/15/15  ([email or web address])    Culture   Final    BACILLUS SPECIES Standardized susceptibility testing for this organism is not available. Performed at [facility]    Report Status 10/17/2015 FINAL  Final  Culture, blood (Routine X 2) w  Reflex to ID Panel     Status: None (Preliminary result)   Collection Time: 10/14/15  7:00 PM  Result Value Ref Range Status   Specimen Description BLOOD LEFT ARM  Final   Special Requests AEROBIC BOTTLE ONLY  Final   Culture   Final    NO GROWTH 4 DAYS Performed at [facility]    Report Status PENDING  Incomplete  Culture, blood (routine x 2)     Status: None (Preliminary result)   Collection Time: 10/18/15 11:20 AM  Result Value Ref Range Status   Specimen Description BLOOD LEFT HAND  Final   Special Requests   Final    IN PEDIATRIC BOTTLE 0.5CC Performed at [facility]    Culture PENDING  Incomplete   Report Status PENDING  Incomplete  Urine culture     Status: None (Preliminary result)   Collection Time: 10/18/15  3:11 PM  Result Value Ref Range Status   Specimen Description URINE, CATHETERIZED  Final   Special Requests NONE  Final   Culture   Final    NO GROWTH < 24 HOURS Performed at [facility]    Report Status PENDING  Incomplete     Scheduled Meds: . aspirin EC  81 mg Oral Daily  . atorvastatin  40 mg Oral Daily  . collagenase   Topical Daily  . donepezil  10 mg Oral QHS  . feeding supplement  237 mL Oral TID BM  . geriatric multivitamins-minerals  15 mL Oral BID  . levofloxacin (LEVAQUIN) IV  750 mg Intravenous Q24H  . mirtazapine  15 mg Oral QHS  . protein supplement  6 g Oral BID  . sodium  chloride  500 mL Intravenous Once   Continuous Infusions: . dextrose 50 mL/hr at 10/19/15 [facility]

## 2015-10-20 LAB — BASIC METABOLIC PANEL
Anion gap: 7 (ref 5–15)
BUN: 13 mg/dL (ref 6–20)
CALCIUM: 9.7 mg/dL (ref 8.9–10.3)
CHLORIDE: 109 mmol/L (ref 101–111)
CO2: 26 mmol/L (ref 22–32)
CREATININE: 0.72 mg/dL (ref 0.44–1.00)
Glucose, Bld: 124 mg/dL — ABNORMAL HIGH (ref 65–99)
Potassium: 3.9 mmol/L (ref 3.5–5.1)
SODIUM: 142 mmol/L (ref 135–145)

## 2015-10-20 LAB — CBC
HCT: 35.1 % — ABNORMAL LOW (ref 36.0–46.0)
HEMOGLOBIN: 10.9 g/dL — AB (ref 12.0–15.0)
MCH: 24.4 pg — ABNORMAL LOW (ref 26.0–34.0)
MCHC: 31.1 g/dL (ref 30.0–36.0)
MCV: 78.5 fL (ref 78.0–100.0)
PLATELETS: 144 10*3/uL — AB (ref 150–400)
RBC: 4.47 MIL/uL (ref 3.87–5.11)
RDW: 15.9 % — ABNORMAL HIGH (ref 11.5–15.5)
WBC: 6.3 10*3/uL (ref 4.0–10.5)

## 2015-10-20 LAB — GLUCOSE, CAPILLARY: GLUCOSE-CAPILLARY: 112 mg/dL — AB (ref 65–99)

## 2015-10-20 MED ORDER — LEVOFLOXACIN 750 MG PO TABS: 750.0000 mg | ORAL_TABLET | Freq: Every day | ORAL | Status: DC

## 2015-10-20 MED ORDER — COLLAGENASE 250 UNIT/GM EX OINT: TOPICAL_OINTMENT | Freq: Every day | CUTANEOUS | Status: DC

## 2015-10-20 NOTE — Discharge Summary (Addendum)
Physician Discharge Summary  [name] BJY:[id number] DOB: Dec 05, 1929 DOA: 10/14/2015  PCP: Garlan Fillers, MD  Admit date: 10/14/2015 Discharge date: 10/20/2015  Recommendations for Outpatient Follow-up:  1. Pt will need to follow up with PCP in 2-3 weeks post discharge 2. Please obtain BMP to evaluate electrolytes and kidney function 3. Please also check CBC to evaluate Hg and Hct levels  Discharge Diagnoses:  Principal Problem:   UTI (lower urinary tract infection) Active Problems:   Type 2 diabetes mellitus (HCC)   Essential hypertension, benign   Aortic stenosis   Senile dementia without behavioral disturbance   Dehydration   CKD (chronic kidney disease) stage 3, GFR 30-59 ml/min   Anemia of chronic disease   Hypernatremia   Thrombocytopenia (HCC)   Complicated UTI (urinary tract infection)   Pressure ulcer  Discharge Condition: Stable  Diet recommendation: Heart healthy diet discussed in details    Brief narrative:    82 y.o. female with PMH of CAD with stents placed in 2005, AV nodal block status post pacer insertion, severe AS, and advanced Alzheimer dementia who presented from SNF with progressive weakness and dehydration. Patient had been undergoing treatment at her nursing facility for hypernatremia and acute kidney injury in the setting of poor PO intake with treatment complicated by the patient pulling out her IV. After failing treatment for dehydration and hypernatremia at her SNF, EMS was activated for transport to the hospital.   Assessment/Plan:    Sepsis secondary to lactobacillus and Enterococcus UTI, bacillus bacteremia  - sepsis present on admission with HR > 90, elevated lactic acid, suspected source UTI  - now clear pt has UTI with the two organisms specified above - even though bacillus sp in blood most often represents contamination, in this particular pt it may be actually significant as urine culture is positive for lactobacillus and  enterococcus  - final blood culture report with bacilus sp, no other species  - pt was on Maxipime, changed to Levaquin 2/16 and today is is day #7 of ABX Repeat blood and urine cultures no growth to date  - pt afebrile in the past 48 hours  Blood culture 1/2 gram neg rod - bacillus, no sensitivity data available per report  Dehydration with hypernatremia  - has been off IVF - pt looks better this AM  Unstageable Pressure injury at the intergluteal crease  - 4cm x 3cm with depth unable to be determined due to the presence of necrotic tissue - frequent turing was recommended  AKI - SCr 1.35 on arrival, up from apparent baseline of ~1  - Cr remains WNL   Type II DM  - A1c 6.0% on 11/23/13  - Not on diabetic medications at home, diet controlled   HTN, essential  - Normotensive currently   Severe AS  - TTE on 05/23/15 demonstrates severe AS  - Will exercise caution with fluid balance, pt likely preload-dependent   Dementia - Can get combative per family but very pleasant this AM   Thrombocytopenia - improving   Hypokalemia - supplemented and WNL this AM  DVT prophylaxis - SCD's  Code Status: Full.  Family Communication: plan of care discussed with pt's son at bedside  Disposition Plan: SNF  IV access:  Peripheral IV  Procedures and diagnostic studies:   Dg Chest 2 View 10/14/2015 Mild right basilar opacity, likely atelectasis/ scarring.   Dg Chest Port 1 View 10/14/2015 Right lower lobe infiltrate could represent atelectasis or pneumonia/ pneumonitis. Possible associated tiny  right pleural effusion.   Medical Consultants:  None  Other Consultants:  PT  IAnti-Infectives:   Maxipime 2/13 --> 2/16 Levaquin 2/16 -->         Discharge Exam: Filed Vitals:   10/19/15 2120 10/20/15 0553  BP: 107/56 100/85  Pulse: 70 62  Temp: 99.4 F (37.4 C) 98.9 F (37.2 C)  Resp: 16 18   Filed Vitals:   10/19/15 1400 10/19/15 2120  10/20/15 0553 10/20/15 0600  BP: 103/39 107/56 100/85   Pulse: 71 70 62   Temp: 98.6 F (37 C) 99.4 F (37.4 C) 98.9 F (37.2 C)   TempSrc: Axillary Axillary Axillary   Resp: 18 16 18    Weight:    98.431 kg (217 lb)  SpO2: 100% 100% 100%     General: Pt is alert, follows commands appropriately, not in acute distress Cardiovascular: Regular rate and rhythm, no rubs, no gallops Respiratory: Clear to auscultation bilaterally, no wheezing, diminished breath sounds at bases  Abdominal: Soft, non tender, non distended, bowel sounds +, no guarding  Discharge Instructions     Medication List    TAKE these medications        acetaminophen 325 MG tablet  Commonly known as:  TYLENOL  Take 650 mg by mouth every 4 (four) hours as needed for mild pain.     aspirin EC 81 MG tablet  Take 81 mg by mouth daily.     atorvastatin 40 MG tablet  Commonly known as:  LIPITOR  Take 40 mg by mouth daily.     calcium-vitamin D 500-200 MG-UNIT tablet  Commonly known as:  OSCAL WITH D  Take 1 tablet by mouth 2 (two) times daily.     collagenase ointment  Commonly known as:  SANTYL  Apply topically daily.     DECUBI-VITE Caps  Take 1 capsule by mouth daily.     geriatric multivitamins-minerals Elix  Take 15 mLs by mouth 2 (two) times daily.     diclofenac sodium 1 % Gel  Commonly known as:  VOLTAREN  Apply 2 g topically 4 (four) times daily as needed (pain).     donepezil 10 MG disintegrating tablet  Commonly known as:  ARICEPT ODT  Take 10 mg by mouth at bedtime.     Lavender Oil Oil  S/O- INSOMINIA /AGITATION/ RESTLESSNESS/ ANXIETY- Apply a drop of lavendar oil or [place] Oil behind each ear per standing order. Repeat PRN. Discontinue use if skin irritation occurs.     levofloxacin 750 MG tablet  Commonly known as:  LEVAQUIN  Take 1 tablet (750 mg total) by mouth daily.     mirtazapine 15 MG tablet  Commonly known as:  REMERON  Take 15 mg by mouth at bedtime.      NUTRITIONAL SHAKE PO  Take by mouth 2 (two) times daily. Start Mighty Shake BID with MedPass (2PM, 8PM)     PROCEL 100 Powd  Take 2 scoop by mouth 2 (two) times daily.     UNABLE TO FIND  Med Name: MedPass 2.0 90 mL TID for nutritional support           Follow-up Information    Follow up with Garlan Fillers, MD.   Specialty:  Internal Medicine   Contact information:   [address] [place] [address] [phone]        The results of significant diagnostics from this hospitalization (including imaging, microbiology, ancillary and laboratory) are listed below for reference.  Microbiology: Recent Results (from the past 240 hour(s))  Urine culture     Status: None   Collection Time: 10/14/15  4:22 PM  Result Value Ref Range Status   Specimen Description URINE, CATHETERIZED  Final   Special Requests NONE  Final   Culture   Final    >=100,000 COLONIES/mL ENTEROCOCCUS SPECIES >=100,000 COLONIES/mL LACTOBACILLUS SPECIES Standardized susceptibility testing for this organism is not available. Performed at [facility]    Report Status 10/18/2015 FINAL  Final   Organism ID, Bacteria ENTEROCOCCUS SPECIES  Final      Susceptibility   Enterococcus species - MIC*    AMPICILLIN <=2 SENSITIVE Sensitive     LEVOFLOXACIN 1 SENSITIVE Sensitive     NITROFURANTOIN <=16 SENSITIVE Sensitive     VANCOMYCIN 1 SENSITIVE Sensitive     * >=100,000 COLONIES/mL ENTEROCOCCUS SPECIES  Culture, blood (Routine X 2) w Reflex to ID Panel     Status: None   Collection Time: 10/14/15  5:15 PM  Result Value Ref Range Status   Specimen Description BLOOD RIGHT ANTECUBITAL  Final   Special Requests BOTTLES DRAWN AEROBIC AND ANAEROBIC 5 CC  Final   Culture  Setup Time   Final    GRAM VARIABLE ROD ANAEROBIC BOTTLE ONLY CRITICAL RESULT CALLED TO, READ BACK BY AND VERIFIED WITH: Shelbie Hutching RN 12:20 10/15/15  ([email or web address])    Culture   Final    BACILLUS SPECIES Standardized susceptibility  testing for this organism is not available. Performed at [facility]    Report Status 10/17/2015 FINAL  Final  Culture, blood (Routine X 2) w Reflex to ID Panel     Status: None   Collection Time: 10/14/15  7:00 PM  Result Value Ref Range Status   Specimen Description BLOOD LEFT ARM  Final   Special Requests AEROBIC BOTTLE ONLY  Final   Culture   Final    NO GROWTH 5 DAYS Performed at [facility]    Report Status 10/19/2015 FINAL  Final  Culture, blood (routine x 2)     Status: None (Preliminary result)   Collection Time: 10/18/15 11:20 AM  Result Value Ref Range Status   Specimen Description BLOOD LEFT HAND  Final   Special Requests IN PEDIATRIC BOTTLE 0.5CC  Final   Culture   Final    NO GROWTH < 24 HOURS Performed at [facility]    Report Status PENDING  Incomplete  Urine culture     Status: None (Preliminary result)   Collection Time: 10/18/15  3:11 PM  Result Value Ref Range Status   Specimen Description URINE, CATHETERIZED  Final   Special Requests NONE  Final   Culture   Final    NO GROWTH < 24 HOURS Performed at [facility]    Report Status PENDING  Incomplete     Labs: Basic Metabolic Panel:  Recent Labs Lab 10/16/15 2025 10/17/15 0635 10/18/15 0420 10/19/15 0747 10/20/15 0648  NA 155* 153* 150* 141 142  K 4.0 3.9 3.3* 4.0 3.9  CL 122* 121* 120* 113* 109  CO2 22 23 22 22 26   GLUCOSE 155* 143* 122* 182* 124*  BUN 29* 23* 22* 17 13  CREATININE 0.82 0.83 0.74 0.77 0.72  CALCIUM 10.1 9.9 9.6 9.2 9.7   Liver Function Tests:  Recent Labs Lab 10/14/15 1529 10/15/15 0649  AST 65* 51*  ALT 59* 48  ALKPHOS 89 72  BILITOT 0.8 0.7  PROT 7.2 6.3*  ALBUMIN 2.7* 2.3*   CBC:  Recent Labs Lab 10/14/15 1525 10/15/15 0649 10/18/15 0420 10/19/15 0747 10/20/15 0648  WBC 10.2 7.9 7.0 6.9 6.3  NEUTROABS  --  4.9  --   --   --   HGB 14.6 11.9* 10.2* 9.8* 10.9*  HCT 48.5* 39.5 34.9* 32.2* 35.1*  MCV 82.3 81.4 82.5  79.7 78.5  PLT 103* 91* 117* 128* 144*    CBG:  Recent Labs Lab 10/16/15 0821 10/17/15 0715 10/18/15 0741 10/19/15 0749 10/20/15 0731  GLUCAP 119* 110* 112* 166* 112*   SIGNED: Time coordinating discharge: 30 minutes  MAGICK-Lua Feng, MD  [facility] 10/20/2015, 1:19 PM Pager [phone]  If 7PM-7AM, please contact night-coverage [email or web address] Password [email or web address]

## 2015-10-21 LAB — GLUCOSE, CAPILLARY: Glucose-Capillary: 126 mg/dL — ABNORMAL HIGH (ref 65–99)

## 2015-10-21 LAB — URINE CULTURE: Culture: 50000

## 2015-10-21 NOTE — Progress Notes (Signed)
Pt for discharge to [facility].  CSW facilitated pt discharge needs including contacting facility, faxing pt discharge information via epic hub, discussing with pt daughter, [name] via telephone, providing RN phone number to call report, and arranging ambulance transport for pt to [facility].  No further social work needs identified at this time.  CSW signing off.   Loletta Specter, MSW, LCSW Clinical Social Work [phone]

## 2015-10-21 NOTE — Progress Notes (Signed)
Pt seen and examined at bedside, stable for discharge. See d/c summary done 2/20. No changes needed.   Debbora Presto, MD  [facility] Pager [phone]  If 7PM-7AM, please contact night-coverage [email or web address] Password [email or web address]

## 2015-10-21 NOTE — Progress Notes (Signed)
Attempted to call report  to [facility] @ [phone] but was told by nurse that she was at lunch and to call [phone].  I called that number and was transferred but no one picked up upon transfer

## 2015-10-21 NOTE — Discharge Instructions (Signed)

## 2015-10-21 NOTE — Care Management Note (Signed)
Case Management Note  Patient Details  Name: [name] MRN: [id number] Date of Birth: [birth year]  Subjective/Objective:    Admitted with UTI                Action/Plan: Discharge planning per CSW  Expected Discharge Date:   (unknown)               Expected Discharge Plan:  Skilled Nursing Facility  In-House Referral:  Clinical Social Work  Discharge planning Services  CM Consult  Post Acute Care Choice:  NA Choice offered to:  NA  DME Arranged:  N/A DME Agency:  NA  HH Arranged:  NA HH Agency:  NA  Status of Service:  Completed, signed off  Medicare Important Message Given:  Yes Date Medicare IM Given:    Medicare IM give by:    Date Additional Medicare IM Given:    Additional Medicare Important Message give by:     If discussed at Long Length of Stay Meetings, dates discussed:    Additional Comments:  Alexis Goodell, RN 10/21/2015, 9:52 AM [phone]

## 2015-10-22 ENCOUNTER — Non-Acute Institutional Stay (SKILLED_NURSING_FACILITY): Payer: Medicare Other | Admitting: Adult Health

## 2015-10-22 ENCOUNTER — Encounter: Payer: Self-pay | Admitting: Adult Health

## 2015-10-22 DIAGNOSIS — I10 Essential (primary) hypertension: Secondary | ICD-10-CM

## 2015-10-22 DIAGNOSIS — E46 Unspecified protein-calorie malnutrition: Secondary | ICD-10-CM

## 2015-10-22 DIAGNOSIS — IMO0002 Reserved for concepts with insufficient information to code with codable children: Secondary | ICD-10-CM

## 2015-10-22 DIAGNOSIS — D696 Thrombocytopenia, unspecified: Secondary | ICD-10-CM

## 2015-10-22 DIAGNOSIS — E785 Hyperlipidemia, unspecified: Secondary | ICD-10-CM

## 2015-10-22 DIAGNOSIS — M1710 Unilateral primary osteoarthritis, unspecified knee: Secondary | ICD-10-CM

## 2015-10-22 DIAGNOSIS — F329 Major depressive disorder, single episode, unspecified: Secondary | ICD-10-CM

## 2015-10-22 DIAGNOSIS — E118 Type 2 diabetes mellitus with unspecified complications: Secondary | ICD-10-CM

## 2015-10-22 DIAGNOSIS — R531 Weakness: Secondary | ICD-10-CM | POA: Diagnosis not present

## 2015-10-22 DIAGNOSIS — A419 Sepsis, unspecified organism: Secondary | ICD-10-CM

## 2015-10-22 DIAGNOSIS — F0390 Unspecified dementia without behavioral disturbance: Secondary | ICD-10-CM | POA: Diagnosis not present

## 2015-10-22 DIAGNOSIS — L8990 Pressure ulcer of unspecified site, unspecified stage: Secondary | ICD-10-CM | POA: Diagnosis not present

## 2015-10-22 DIAGNOSIS — M179 Osteoarthritis of knee, unspecified: Secondary | ICD-10-CM | POA: Diagnosis not present

## 2015-10-22 DIAGNOSIS — F32A Depression, unspecified: Secondary | ICD-10-CM

## 2015-10-22 DIAGNOSIS — I350 Nonrheumatic aortic (valve) stenosis: Secondary | ICD-10-CM | POA: Diagnosis not present

## 2015-10-22 NOTE — Progress Notes (Signed)
Patient ID: [name], female   DOB: 28-Apr-1930, 82 y.o.   MRN: [id number]    DATE:  10/22/2015   MRN:  [id number]  BIRTHDAY: March 30, 1930  Facility:  Nursing Home Location:  [facility]  Nursing Home Room Number: 203-2  LEVEL OF CARE:  [phone])  Contact Information    Name Relation Home Work [place] Daughter [phone]  [phone]   Chevis Pretty   [phone]   Steva Colder   [phone]       Code Status History    Date Active Date Inactive Code Status Order ID Comments User Context   10/14/2015  8:50 PM 10/21/2015  4:06 PM Full Code [id number]  Briscoe Deutscher, MD ED   05/17/2015  7:27 PM 05/24/2015  5:49 PM Full Code [id number]  Jeralyn Bennett, MD Inpatient   11/23/2013  8:36 PM 11/24/2013 11:52 PM Full Code [id number]  Martha Clan, MD Inpatient       Chief Complaint  Patient presents with  . Hospitalization Follow-up    HISTORY OF PRESENT ILLNESS:  This is an 82 year old female was been readmitted to [facility] on 10/21/15 from [facility]. She has PMH of CAD with stents placed in 2005, AV nodal block S/P pacer insertion, severe AF and advanced Alzheimer's dementia. She was treated in the hospital for sepsis secondary to lactobacillus and enterococcus UTI, bacillus bacteremia. Sepsis was present on admission with HR > 90, elevated lactic acid and suspected source UTI. She was treated with Maxipime then changed to Levaquin 2/16. Repeat blood and urine cultures showed no growth.  She has been admitted for a long-term care.  PAST MEDICAL HISTORY:  Past Medical History  Diagnosis Date  . DM (diabetes mellitus) (HCC)   . Hyperlipidemia   . HTN (hypertension)   . CAD (coronary artery disease)     s/p NSTEMI 2003 with stent placement OM1.  PTCA with cutting balloon prox LAD. 2005 admission with chest pain, BMS placed to CFX and DES placed in RCA  . Aortic stenosis     moderate to severe  . Anemia of chronic  disease   . CRI (chronic renal insufficiency)   . CVA (cerebral infarction)     remote; recovered  . Second degree Mobitz II AV block     s/p PPM 12/2009  . Hx of colonoscopy   . Headache 2003  . GERD (gastroesophageal reflux disease)   . Alzheimer's disease 07/2011  . PAD (peripheral artery disease) (HCC)   . Atherosclerosis   . Osteoporosis   . Fall (on)(from) incline, initial encounter   . Atherosclerosis of artery   . Pressure ulcer   . UTI (lower urinary tract infection)   . Thrombocytopenia (HCC)   . Hypernatremia      CURRENT MEDICATIONS: Reviewed  Patient's Medications  New Prescriptions   No medications on file  Previous Medications   ACETAMINOPHEN (TYLENOL) 325 MG TABLET    Take 650 mg by mouth every 4 (four) hours as needed for mild pain (For pain or temperature greater than 99.5 orally).    ASPIRIN EC 81 MG TABLET    Take 81 mg by mouth daily.   ATORVASTATIN (LIPITOR) 40 MG TABLET    Take 40 mg by mouth daily.    CALCIUM-VITAMIN D (OSCAL WITH D) 500-200 MG-UNIT TABLET    Take 1 tablet by mouth 2 (two) times daily.   COLLAGENASE (SANTYL) OINTMENT    Apply topically daily.  DICLOFENAC SODIUM (VOLTAREN) 1 % GEL    Apply 2 g topically 4 (four) times daily as needed (pain).   DONEPEZIL (ARICEPT ODT) 10 MG DISINTEGRATING TABLET    Take 10 mg by mouth at bedtime.   GERIATRIC MULTIVITAMINS-MINERALS (ELDERTONIC/GEVRABON) ELIX    Take 15 mLs by mouth 2 (two) times daily.   LAVENDER OIL OIL    S/O- INSOMINIA /AGITATION/ RESTLESSNESS/ ANXIETY- Apply a drop of lavendar oil or [place] Oil behind each ear per standing order. Repeat PRN. Discontinue use if skin irritation occurs.   LEVOFLOXACIN (LEVAQUIN) 750 MG TABLET    Take 1 tablet (750 mg total) by mouth daily.   MIRTAZAPINE (REMERON) 15 MG TABLET    Take 15 mg by mouth at bedtime.   MULTIPLE VITAMINS-MINERALS (DECUBI-VITE) CAPS    Take 1 capsule by mouth daily.   NUTRITIONAL SUPPLEMENTS (NUTRITIONAL SHAKE PO)    Take by  mouth 2 (two) times daily. Start Mighty Shake BID with MedPass (2PM, 8PM)   PROTEIN (PROCEL 100) POWD    Take 2 scoop by mouth 2 (two) times daily.   UNABLE TO FIND    Med Name: MedPass 2.0 90 mL TID for nutritional support  Modified Medications   No medications on file  Discontinued Medications   No medications on file     Allergies  Allergen Reactions  . Latex Rash     REVIEW OF SYSTEMS:  Unabel to obtain due to advanced alzheimer's dementia   PHYSICAL EXAMINATION  GENERAL APPEARANCE:  In no acute distress.  SKIN:  Has sacral pressure ulcer, unstageable HEAD: Normal in size and contour. No evidence of trauma EYES: Lids open and close normally. No blepharitis, entropion or ectropion. PERRL. Conjunctivae are clear and sclerae are white. Lenses are without opacity EARS: Pinnae are normal. Patient hears normal voice tunes of the examiner MOUTH and THROAT: Lips are without lesions. Oral mucosa is moist and without lesions. Tongue is normal in shape, size, and color and without lesions NECK: supple, trachea midline, no neck masses, no thyroid tenderness, no thyromegaly LYMPHATICS: no LAN in the neck, no supraclavicular LAN RESPIRATORY: breathing is even & unlabored, BS CTAB CARDIAC: RRR, + murmur,no extra heart sounds, no edema GI: abdomen soft, normal BS, no masses, no tenderness, no hepatomegaly, no splenomegaly EXTREMITIES:  Able to move BUE and has generalized weakness on BLE PSYCHIATRIC: Alert and oriented X 3. Affect and behavior are appropriate  LABS/RADIOLOGY: Labs reviewed: Basic Metabolic Panel:  Recent Labs  40/98/11 0428  10/18/15 0420 10/19/15 0747 10/20/15 0648  NA 144  < > 150* 141 142  K 3.0*  < > 3.3* 4.0 3.9  CL 108  < > 120* 113* 109  CO2 28  < > GLUCOSE 138*  < > 122* 182* 124*  BUN 39*  < > 22* 17 13  CREATININE 1.34*  < > 0.74 0.77 0.72  CALCIUM 9.7  < > 9.6 9.2 9.7  MG 1.8  --   --   --   --   < > = values in this interval not  displayed. Liver Function Tests:  Recent Labs  06/07/15 2252 08/14/15 10/14/15 1529 10/15/15 0649  AST 30 25 65* 51*  ALT 19 26 59* 48  ALKPHOS 77 123 89 72  BILITOT 1.1  --  0.8 0.7  PROT 7.1  --  7.2 6.3*  ALBUMIN 2.9*  --  2.7* 2.3*   CBC:  Recent Labs  09/17/15 09/24/15  10/15/15 0649 10/18/15 0420 10/19/15 0747 10/20/15 0648  WBC 7.1 6.9  < > 7.9 7.0 6.9 6.3  NEUTROABS 4 3  --  4.9  --   --   --   HGB 11.2* 12.1  < > 11.9* 10.2* 9.8* 10.9*  HCT 39 41  < > 39.5 34.9* 32.2* 35.1*  MCV  --   --   < > 81.4 82.5 79.7 78.5  PLT 289 288  < > 91* 117* 128* 144*  < > = values in this interval not displayed.  Cardiac Enzymes:  Recent Labs  05/17/15 1306  TROPONINI 0.08*   CBG:  Recent Labs  10/19/15 0749 10/20/15 0731 10/21/15 0721  GLUCAP 166* 112* 126*      Dg Chest 2 View  10/14/2015  CLINICAL DATA:  Weakness, dementia EXAM: CHEST  2 VIEW COMPARISON:  06/07/2015 FINDINGS: Mild right basilar opacity, likely atelectasis/ scarring. No focal consolidation. No pleural effusion or pneumothorax. Cardiomegaly.  Left subclavian pacemaker. Degenerative changes of the visualized thoracolumbar spine. IMPRESSION: Mild right basilar opacity, likely atelectasis/ scarring. Electronically Signed   By: Charline Bills M.D.   On: 10/14/2015 16:55   Dg Chest Port 1 View  10/14/2015  CLINICAL DATA:  Sepsis altered mental status EXAM: PORTABLE CHEST 1 VIEW COMPARISON:  10/14/2015 FINDINGS: Stable mild cardiac enlargement. Stable 2 lead cardiac pacer. Vascular pattern normal. Infiltrate or atelectasis right lower lobe. Left lung clear. Possible tiny right pleural effusion. IMPRESSION: Right lower lobe infiltrate could represent atelectasis or pneumonia/ pneumonitis. Possible associated tiny right pleural effusion. Electronically Signed   By: Esperanza Heir M.D.   On: 10/14/2015 21:25    ASSESSMENT/PLAN:  Generalized weakness - for rehabilitation  Sepsis secondary to  lactobacillus and enterococcus UTI, bacillus bacteremia - was put on Maxipime and change to Levaquin 750 mg daily  which will be continued 5 more days  Diabetes mellitus, type II - hemoglobin A1c 6.0; on no medication  Hypertension - stable; on no medication  Severe aortic stenosis - TTE on 05/23/15 demonstrates severe AS; continue aspirin 81 mg daily  Dementia - continue Aricept 10 mg 1 tab by mouth daily at HS; check BMP  Thrombocytopenia - platelet 144; check CBC  Hyperlipidemia - continue Lipitor 40 mg 1 tab by mouth daily  Sacral pressure ulcer - continue decubivite 1 tab daily; continue wound treatment and air mattress  Depression - continue Remeron 15 mg 1 tab by mouth daily at bedtime  Osteoarthrosis - continue Voltaren gel 1% apply 2 g to BLE 4 times a day when necessary and acetaminophen 325 mg take 2 tabs = 650 mg by mouth every 4 hours when necessary  Protein calorie malnutrition, severe - albumin 2.3; continue Procel 2 scoops by mouth twice a day     Goals of care:  Long-term care   [facility], NP [facility] [phone]

## 2015-10-23 LAB — CBC AND DIFFERENTIAL
HCT: 33 % — AB (ref 36–46)
Hemoglobin: 10.2 g/dL — AB (ref 12.0–16.0)
Neutrophils Absolute: 4 /uL
Platelets: 222 10*3/uL (ref 150–399)
WBC: 6.4 10^3/mL

## 2015-10-23 LAB — CULTURE, BLOOD (ROUTINE X 2): CULTURE: NO GROWTH

## 2015-10-23 LAB — BASIC METABOLIC PANEL
BUN: 16 mg/dL (ref 4–21)
CREATININE: 0.8 mg/dL (ref 0.5–1.1)
GLUCOSE: 150 mg/dL
POTASSIUM: 3.7 mmol/L (ref 3.4–5.3)
SODIUM: 139 mmol/L (ref 137–147)

## 2015-10-24 ENCOUNTER — Non-Acute Institutional Stay (SKILLED_NURSING_FACILITY): Payer: Medicare Other | Admitting: Internal Medicine

## 2015-10-24 ENCOUNTER — Encounter: Payer: Self-pay | Admitting: Internal Medicine

## 2015-10-24 DIAGNOSIS — I10 Essential (primary) hypertension: Secondary | ICD-10-CM

## 2015-10-24 DIAGNOSIS — E46 Unspecified protein-calorie malnutrition: Secondary | ICD-10-CM | POA: Diagnosis not present

## 2015-10-24 DIAGNOSIS — L8995 Pressure ulcer of unspecified site, unstageable: Secondary | ICD-10-CM | POA: Diagnosis not present

## 2015-10-24 DIAGNOSIS — M79601 Pain in right arm: Secondary | ICD-10-CM | POA: Diagnosis not present

## 2015-10-24 DIAGNOSIS — F0281 Dementia in other diseases classified elsewhere with behavioral disturbance: Secondary | ICD-10-CM

## 2015-10-24 DIAGNOSIS — R531 Weakness: Secondary | ICD-10-CM | POA: Diagnosis not present

## 2015-10-24 DIAGNOSIS — G308 Other Alzheimer's disease: Secondary | ICD-10-CM

## 2015-10-24 DIAGNOSIS — F32A Depression, unspecified: Secondary | ICD-10-CM

## 2015-10-24 DIAGNOSIS — E785 Hyperlipidemia, unspecified: Secondary | ICD-10-CM | POA: Diagnosis not present

## 2015-10-24 DIAGNOSIS — E118 Type 2 diabetes mellitus with unspecified complications: Secondary | ICD-10-CM

## 2015-10-24 DIAGNOSIS — F329 Major depressive disorder, single episode, unspecified: Secondary | ICD-10-CM

## 2015-10-24 NOTE — Progress Notes (Signed)
Patient ID: [name], female   DOB: 08-30-1930, 82 y.o.   MRN: [id number]    LOCATION: [facility]  PCP: Garlan Fillers, MD   Code Status: Full Code  Goals of care: Advanced Directive information Advanced Directives 10/14/2015  Does patient have an advance directive? No  Would patient like information on creating an advanced directive? No - patient declined information       Extended Emergency Contact Information Primary Emergency Contact: Blackmon,Denise Address: [address]          [place] Home Phone: [phone] Mobile Phone: [phone] Relation: Daughter Secondary Emergency Contact: Hockett,Timothy Address: [address]          [place], [place] [address] [place] [employer] Phone: [phone] Relation: Son   Allergies  Allergen Reactions  . Latex Rash    Chief Complaint  Patient presents with  . Readmit To SNF    Readmission     HPI:  Patient is a 82 y.o. female seen today for long term care post hospital admission with sepsis from lactobacillus and enterococcus UTI and acute kidney injury. She required iv antibiotics and iv fluids. She is now on oral antibiotics. She has medical history of AS, dementia, obesity. She is under total care. She is in her bed and is in no distress. She participates minimally in conversation. Unable to obtain ROS. As per staff no fever or fall since her return from the hospital. She needs to be fed and has been eating good this am. Per staff, patient does not allow them to move her right arm and cries in pain on doing so. She has a foley catheter at present.   Review of Systems:  Unable to obtain    Past Medical History  Diagnosis Date  . DM (diabetes mellitus) (HCC)   . Hyperlipidemia   . HTN (hypertension)   . CAD (coronary artery disease)     s/p NSTEMI 2003 with stent placement OM1.  PTCA with cutting balloon prox LAD. 2005 admission with chest pain, BMS  placed to CFX and DES placed in RCA  . Aortic stenosis     moderate to severe  . Anemia of chronic disease   . CRI (chronic renal insufficiency)   . CVA (cerebral infarction)     remote; recovered  . Second degree Mobitz II AV block     s/p PPM 12/2009  . Hx of colonoscopy   . Headache 2003  . GERD (gastroesophageal reflux disease)   . Alzheimer's disease 07/2011  . PAD (peripheral artery disease) (HCC)   . Atherosclerosis   . Osteoporosis   . Fall (on)(from) incline, initial encounter   . Atherosclerosis of artery   . Pressure ulcer   . UTI (lower urinary tract infection)   . Thrombocytopenia (HCC)   . Hypernatremia    Past Surgical History  Procedure Laterality Date  . Hysterectomy    . Benign vocal cord tumor removed    . Right knee surgery      x 3 with TKA  . Pacemaker insertion  01/10/2010    SJM [facility] DR RF implanted by Dr Johney Frame   Social History:   reports that she quit smoking about 25 years ago. She has never used smokeless tobacco. She reports that she does not drink alcohol or use illicit drugs.  Family History  Problem Relation Age of Onset  . Coronary artery disease    . Hypertension    .  Diabetes Mother   . Diabetes Father   . Heart disease Mother   . Colon cancer Neg Hx   . Stomach cancer Sister     Medications:   Medication List       This list is accurate as of: 10/24/15 10:58 AM.  Always use your most recent med list.               acetaminophen 325 MG tablet  Commonly known as:  TYLENOL  Take 650 mg by mouth every 4 (four) hours as needed for mild pain (For pain or temperature greater than 99.5 orally).     aspirin EC 81 MG tablet  Take 81 mg by mouth daily.     atorvastatin 40 MG tablet  Commonly known as:  LIPITOR  Take 40 mg by mouth daily.     calcium-vitamin D 500-200 MG-UNIT tablet  Commonly known as:  OSCAL WITH D  Take 1 tablet by mouth 2 (two) times daily.     DECUBI-VITE Caps  Take 1 capsule by mouth daily.      geriatric multivitamins-minerals Elix  Take 15 mLs by mouth 2 (two) times daily.     diclofenac sodium 1 % Gel  Commonly known as:  VOLTAREN  Apply 2 g topically 4 (four) times daily as needed (pain).     donepezil 10 MG disintegrating tablet  Commonly known as:  ARICEPT ODT  Take 10 mg by mouth at bedtime.     furosemide 20 MG tablet  Commonly known as:  LASIX  Take 20 mg by mouth daily.     Lavender Oil Oil  S/O- INSOMINIA /AGITATION/ RESTLESSNESS/ ANXIETY- Apply a drop of lavendar oil or [place] Oil behind each ear per standing order. Repeat PRN. Discontinue use if skin irritation occurs.     Melatonin 3 MG Tabs  Take 3 mg by mouth at bedtime.     mirtazapine 15 MG tablet  Commonly known as:  REMERON  Take 15 mg by mouth at bedtime.     NUTRITIONAL SHAKE PO  Take by mouth 2 (two) times daily. Start Mighty Shake BID with MedPass (2PM, 8PM)     potassium chloride 10 MEQ tablet  Commonly known as:  K-DUR  Take 10 mEq by mouth daily.     PROCEL 100 Powd  Take 2 scoop by mouth 2 (two) times daily.     UNABLE TO FIND  Med Name: MedPass 2.0 90 mL TID for nutritional support        Immunizations: Immunization History  Administered Date(s) Administered  . PPD Test 06/10/2015     Physical Exam: Filed Vitals:   10/24/15 1037  BP: 110/54  Pulse: 96  Temp: 100.2 F (37.9 C)  TempSrc: Oral  Resp: 16  Height: 5\' 9"  (1.753 m)  Weight: 206 lb (93.441 kg)  SpO2: 96%   Body mass index is 30.41 kg/(m^2).   General- elderly female, obese, in no acute distress Head- normocephalic, atraumatic Nose- no maxillary or frontal sinus tenderness, no nasal discharge Throat- moist mucus membrane Eyes- no pallor, no icterus, no discharge, normal conjunctiva, normal sclera Neck- no cervical lymphadenopathy, no jugular vein distension Cardiovascular- normal s1,s2, systolic murmur +, bilateral leg edema + Respiratory- bilateral clear to auscultation, no wheeze, no rhonchi, no  crackles, no use of accessory muscles Abdomen- bowel sounds present, soft, non tender Musculoskeletal- able to move all 4 extremities, right shoulder and arm pain with slightest movement, right arm swelling +, good radial pulse  Nurological- alert and oriented to person only Skin- warm and dry, sacral unstageable pressure ulcer + Psychiatry- calm this visit    Labs reviewed: Basic Metabolic Panel:  Recent Labs  16/10/96 0428  10/18/15 0420 10/19/15 0747 10/20/15 0648  NA 144  < > 150* 141 142  K 3.0*  < > 3.3* 4.0 3.9  CL 108  < > 120* 113* 109  CO2 28  < > GLUCOSE 138*  < > 122* 182* 124*  BUN 39*  < > 22* 17 13  CREATININE 1.34*  < > 0.74 0.77 0.72  CALCIUM 9.7  < > 9.6 9.2 9.7  MG 1.8  --   --   --   --   < > = values in this interval not displayed. Liver Function Tests:  Recent Labs  06/07/15 2252 08/14/15 10/14/15 1529 10/15/15 0649  AST 30 25 65* 51*  ALT 19 26 59* 48  ALKPHOS 77 123 89 72  BILITOT 1.1  --  0.8 0.7  PROT 7.1  --  7.2 6.3*  ALBUMIN 2.9*  --  2.7* 2.3*   No results for input(s): LIPASE, AMYLASE in the last 8760 hours. No results for input(s): AMMONIA in the last 8760 hours. CBC:  Recent Labs  09/17/15 09/24/15  10/15/15 0649 10/18/15 0420 10/19/15 0747 10/20/15 0648  WBC 7.1 6.9  < > 7.9 7.0 6.9 6.3  NEUTROABS 4 3  --  4.9  --   --   --   HGB 11.2* 12.1  < > 11.9* 10.2* 9.8* 10.9*  HCT 39 41  < > 39.5 34.9* 32.2* 35.1*  MCV  --   --   < > 81.4 82.5 79.7 78.5  PLT 289 288  < > 91* 117* 128* 144*  < > = values in this interval not displayed. Cardiac Enzymes:  Recent Labs  05/17/15 1306  TROPONINI 0.08*   BNP: Invalid input(s): POCBNP CBG:  Recent Labs  10/19/15 0749 10/20/15 0731 10/21/15 0721  GLUCAP 166* 112* 126*    Radiological Exams: Dg Chest 2 View  10/14/2015  CLINICAL DATA:  Weakness, dementia EXAM: CHEST  2 VIEW COMPARISON:  06/07/2015 FINDINGS: Mild right basilar opacity, likely atelectasis/  scarring. No focal consolidation. No pleural effusion or pneumothorax. Cardiomegaly.  Left subclavian pacemaker. Degenerative changes of the visualized thoracolumbar spine. IMPRESSION: Mild right basilar opacity, likely atelectasis/ scarring. Electronically Signed   By: Charline Bills M.D.   On: 10/14/2015 16:55   Dg Chest Port 1 View  10/14/2015  CLINICAL DATA:  Sepsis altered mental status EXAM: PORTABLE CHEST 1 VIEW COMPARISON:  10/14/2015 FINDINGS: Stable mild cardiac enlargement. Stable 2 lead cardiac pacer. Vascular pattern normal. Infiltrate or atelectasis right lower lobe. Left lung clear. Possible tiny right pleural effusion. IMPRESSION: Right lower lobe infiltrate could represent atelectasis or pneumonia/ pneumonitis. Possible associated tiny right pleural effusion. Electronically Signed   By: Esperanza Heir M.D.   On: 10/14/2015 21:25    Assessment/Plan  Generalized weakness Will have her work with physical therapy and occupational therapy team to help with gait training and muscle strengthening exercises.fall precautions. Skin care. Encourage to be out of bed.   UTI Continue levaquin 750 mg daily until 10/26/15. Maintain hydration  Right arm pain Get xray to rule out fracture and with her swelling, get doppler to rule out DVT. Give tylenol 500 mg tid for pain until xray and doppler are resulted.   Pressure ulcer Gel overlay mattress, wound  care and pressure ulcer prophylaxis. Continue decubivite. Monitor clinically for signs of infection  Protein calorie malnutrition Monitor po intake. Continue procel  Diabetes mellitus a1c 6. Diet controlled  Hypertension  Stable, off all meds at present. Monitor. Continue baby aspirin  Dementia  Advanced. continue Aricept 10 mg daily, assistance with ADLs. Fall precautions  Hyperlipidemia  continue Lipitor 40 mg daily  Chronic Depression continue Remeron 15 mg daily   Goals of care: long term care   Labs/tests ordered: cbc,  bmp  Family/ staff Communication: reviewed care plan with patient and nursing supervisor    Oneal Grout, MD Internal Medicine [facility] [address] [place], [place] [address] Cell Phone (Monday-Friday 8 am - 5 pm): [phone] On Call: [phone] and follow prompts after 5 pm and on weekends Office Phone: [phone] Office Fax: [phone]

## 2015-10-30 ENCOUNTER — Non-Acute Institutional Stay (SKILLED_NURSING_FACILITY): Payer: Medicare Other | Admitting: Adult Health

## 2015-10-30 ENCOUNTER — Encounter: Payer: Self-pay | Admitting: Adult Health

## 2015-10-30 DIAGNOSIS — L8995 Pressure ulcer of unspecified site, unstageable: Secondary | ICD-10-CM

## 2015-10-30 DIAGNOSIS — L089 Local infection of the skin and subcutaneous tissue, unspecified: Secondary | ICD-10-CM

## 2015-10-30 NOTE — Progress Notes (Signed)
Patient ID: [name], female   DOB: 03/24/1930, 82 y.o.   MRN: [id number]    DATE:  10/30/15  MRN:  [id number]  BIRTHDAY: 1930-07-26  Facility:  Nursing Home Location:  [facility]  Nursing Home Room Number: 203-2  LEVEL OF CARE:  SNF [phone])  Contact Information    Name Relation Home Work [place] Daughter [phone]  [phone]   Chevis Pretty   [phone]   Steva Colder   [phone]       Code Status History    Date Active Date Inactive Code Status Order ID Comments User Context   10/14/2015  8:50 PM 10/21/2015  4:06 PM Full Code [id number]  Briscoe Deutscher, MD ED   05/17/2015  7:27 PM 05/24/2015  5:49 PM Full Code [id number]  Jeralyn Bennett, MD Inpatient   11/23/2013  8:36 PM 11/24/2013 11:52 PM Full Code [id number]  Martha Clan, MD Inpatient       Chief Complaint  Patient presents with  . Acute Visit    Infected pressure ulcer    HISTORY OF PRESENT ILLNESS:  This is an 82 year old female who is being seen for an acute visit. She has a pressure ulcer on her left sacral area which is unstageable, 5.6 X 5.8 cm, indurated, slight serous drainage and has necrotic tissue in the middle.   She was recently re-admitted to [facility] on 10/21/15 from [facility] was hospitalized due to sepsis from lactobacillus and enterococcus UTI and acute kidney injury. She was given IV antibiotics and IV fluids.  She has been re-admitted for a long-term care.  PAST MEDICAL HISTORY:  Past Medical History  Diagnosis Date  . DM (diabetes mellitus) (HCC)   . Hyperlipidemia   . HTN (hypertension)   . CAD (coronary artery disease)     s/p NSTEMI 2003 with stent placement OM1.  PTCA with cutting balloon prox LAD. 2005 admission with chest pain, BMS placed to CFX and DES placed in RCA  . Aortic stenosis     moderate to severe  . Anemia of chronic disease   . CRI (chronic renal insufficiency)   . CVA (cerebral infarction)    remote; recovered  . Second degree Mobitz II AV block     s/p PPM 12/2009  . Hx of colonoscopy   . Headache 2003  . GERD (gastroesophageal reflux disease)   . Alzheimer's disease 07/2011  . PAD (peripheral artery disease) (HCC)   . Atherosclerosis   . Osteoporosis   . Fall (on)(from) incline, initial encounter   . Atherosclerosis of artery   . Pressure ulcer   . UTI (lower urinary tract infection)   . Thrombocytopenia (HCC)   . Hypernatremia   . Generalized weakness   . Right arm pain   . Protein calorie malnutrition (HCC)   . Chronic constipation      CURRENT MEDICATIONS: Reviewed  Patient's Medications  New Prescriptions   No medications on file  Previous Medications   ACETAMINOPHEN (TYLENOL) 325 MG TABLET    Take 650 mg by mouth every 4 (four) hours as needed for mild pain (For pain or temperature greater than 99.5 orally).    ASPIRIN EC 81 MG TABLET    Take 81 mg by mouth daily.   ATORVASTATIN (LIPITOR) 40 MG TABLET    Take 40 mg by mouth daily.    CALCIUM-VITAMIN D (OSCAL WITH D) 500-200 MG-UNIT TABLET    Take 1 tablet by mouth 2 (two)  times daily.   DICLOFENAC SODIUM (VOLTAREN) 1 % GEL    Apply 2 g topically 4 (four) times daily as needed (pain).   DONEPEZIL (ARICEPT ODT) 10 MG DISINTEGRATING TABLET    Take 10 mg by mouth at bedtime.   DOXYCYCLINE (VIBRA-TABS) 100 MG TABLET    Take 100 mg by mouth 2 (two) times daily. For 10 (ten) days   LAVENDER OIL OIL    S/O- INSOMINIA /AGITATION/ RESTLESSNESS/ ANXIETY- Apply a drop of lavendar oil or [place] Oil behind each ear per standing order. Repeat PRN. Discontinue use if skin irritation occurs.   MIRTAZAPINE (REMERON) 15 MG TABLET    Take 15 mg by mouth at bedtime.   MULTIPLE VITAMINS-MINERALS (DECUBI-VITE) CAPS    Take 1 capsule by mouth daily.   NUTRITIONAL SUPPLEMENTS (NUTRITIONAL SHAKE PO)    Take by mouth 2 (two) times daily. Start Mighty Shake BID with MedPass (2PM, 8PM)   PROTEIN (PROCEL 100) POWD    Take 2 scoop by  mouth 2 (two) times daily.   SACCHAROMYCES BOULARDII (FLORASTOR) 250 MG CAPSULE    Take 250 mg by mouth 2 (two) times daily. For 13 days   UNABLE TO FIND    Med Name: MedPass 2.0 90 mL TID for nutritional support  Modified Medications   No medications on file  Discontinued Medications   FUROSEMIDE (LASIX) 20 MG TABLET    Take 20 mg by mouth daily.   GERIATRIC MULTIVITAMINS-MINERALS (ELDERTONIC/GEVRABON) ELIX    Take 15 mLs by mouth 2 (two) times daily.   MELATONIN 3 MG TABS    Take 3 mg by mouth at bedtime.   POTASSIUM CHLORIDE (K-DUR) 10 MEQ TABLET    Take 10 mEq by mouth daily.     Allergies  Allergen Reactions  . Latex Rash     REVIEW OF SYSTEMS:  Unable to obtain due to advanced dementia   PHYSICAL EXAMINATION  GENERAL APPEARANCE:  In no acute distress. Obese SKIN:  Left ustageable sacral pressure ulcer HEAD: Normal in size and contour. No evidence of trauma EYES: Lids open and close normally. No blepharitis, entropion or ectropion. PERRL. Conjunctivae are clear and sclerae are white. Lenses are without opacity EARS: Pinnae are normal. Patient hears normal voice tunes of the examiner MOUTH and THROAT: Lips are without lesions. Oral mucosa is moist and without lesions. Tongue is normal in shape, size, and color and without lesions NECK: supple, trachea midline, no neck masses, no thyroid tenderness, no thyromegaly LYMPHATICS: no LAN in the neck, no supraclavicular LAN RESPIRATORY: breathing is even & unlabored, BS CTAB CARDIAC: RRR, no murmur,no extra heart sounds, no edema GI: abdomen soft, normal BS, no masses, no tenderness, no hepatomegaly, no splenomegaly GU:   Has foley catheter draining to urine bag EXTREMITIES:  Able to move BUE, BLE generalized weakness PSYCHIATRIC: Alert and oriented X 3. Affect and behavior are appropriate  LABS/RADIOLOGY: Labs reviewed: Basic Metabolic Panel:  Recent Labs  16/10/96 0428  10/18/15 0420 10/19/15 0747 10/20/15 0648 10/23/15   NA 144  < > 150* 141 142 139  K 3.0*  < > 3.3* 4.0 3.9 3.7  CL 108  < > 120* 113* 109  --   CO2 28  < > --   GLUCOSE 138*  < > 122* 182* 124*  --   BUN 39*  < > 22* CREATININE 1.34*  < > 0.74 0.77 0.72 0.8  CALCIUM 9.7  < >  9.6 9.2 9.7  --   MG 1.8  --   --   --   --   --   < > = values in this interval not displayed. Liver Function Tests:  Recent Labs  06/07/15 2252 08/14/15 10/14/15 1529 10/15/15 0649  AST 30 25 65* 51*  ALT 19 26 59* 48  ALKPHOS 77 123 89 72  BILITOT 1.1  --  0.8 0.7  PROT 7.1  --  7.2 6.3*  ALBUMIN 2.9*  --  2.7* 2.3*   CBC:  Recent Labs  09/24/15  10/15/15 0649 10/18/15 0420 10/19/15 0747 10/20/15 0648 10/23/15  WBC 6.9  < > 7.9 7.0 6.9 6.3 6.4  NEUTROABS 3  --  4.9  --   --   --  4  HGB 12.1  < > 11.9* 10.2* 9.8* 10.9* 10.2*  HCT 41  < > 39.5 34.9* 32.2* 35.1* 33*  MCV  --   < > 81.4 82.5 79.7 78.5  --   PLT 288  < > 91* 117* 128* 144* 222  < > = values in this interval not displayed.  Cardiac Enzymes:  Recent Labs  05/17/15 1306  TROPONINI 0.08*   CBG:  Recent Labs  10/19/15 0749 10/20/15 0731 10/21/15 0721  GLUCAP 166* 112* 126*      Dg Chest 2 View  10/14/2015  CLINICAL DATA:  Weakness, dementia EXAM: CHEST  2 VIEW COMPARISON:  06/07/2015 FINDINGS: Mild right basilar opacity, likely atelectasis/ scarring. No focal consolidation. No pleural effusion or pneumothorax. Cardiomegaly.  Left subclavian pacemaker. Degenerative changes of the visualized thoracolumbar spine. IMPRESSION: Mild right basilar opacity, likely atelectasis/ scarring. Electronically Signed   By: Charline Bills M.D.   On: 10/14/2015 16:55   Dg Chest Port 1 View  10/14/2015  CLINICAL DATA:  Sepsis altered mental status EXAM: PORTABLE CHEST 1 VIEW COMPARISON:  10/14/2015 FINDINGS: Stable mild cardiac enlargement. Stable 2 lead cardiac pacer. Vascular pattern normal. Infiltrate or atelectasis right lower lobe. Left lung clear. Possible tiny  right pleural effusion. IMPRESSION: Right lower lobe infiltrate could represent atelectasis or pneumonia/ pneumonitis. Possible associated tiny right pleural effusion. Electronically Signed   By: Esperanza Heir M.D.   On: 10/14/2015 21:25    ASSESSMENT/PLAN:  Infected Sacral Pressure Ulcer - patient has poor appetite and does not move much when in bed making her prone to pressure ulcer; start Doxycycline 100 mg 1 PO BID X 10 days and Florastor 250 mg 1 capsule BID X 13 days; continue decubi vite, medpass and procel    MEDINA-VARGAS,Dimitris Shanahan, NP [employer] [phone]

## 2015-11-06 LAB — BASIC METABOLIC PANEL
BUN: 13 mg/dL (ref 4–21)
CREATININE: 0.7 mg/dL (ref 0.5–1.1)
GLUCOSE: 96 mg/dL
POTASSIUM: 4 mmol/L (ref 3.4–5.3)
SODIUM: 140 mmol/L (ref 137–147)

## 2015-11-06 LAB — CBC AND DIFFERENTIAL
HCT: 32 % — AB (ref 36–46)
HEMOGLOBIN: 9.6 g/dL — AB (ref 12.0–16.0)
Neutrophils Absolute: 6 /uL
Platelets: 361 10*3/uL (ref 150–399)
WBC: 9 10*3/mL

## 2015-11-07 ENCOUNTER — Other Ambulatory Visit: Payer: Self-pay

## 2015-11-07 MED ORDER — HYDROCODONE-ACETAMINOPHEN 5-325 MG PO TABS: ORAL_TABLET | ORAL | Status: AC

## 2015-11-07 NOTE — Telephone Encounter (Signed)
Rx faxed to [facility] @ [phone], phone number [phone]  

## 2015-11-11 ENCOUNTER — Other Ambulatory Visit: Payer: Self-pay | Admitting: *Deleted

## 2015-11-11 MED ORDER — HYDROCODONE-ACETAMINOPHEN 5-325 MG PO TABS: ORAL_TABLET | ORAL | Status: DC

## 2015-11-11 NOTE — Telephone Encounter (Signed)
[facility] 

## 2015-11-12 ENCOUNTER — Non-Acute Institutional Stay (SKILLED_NURSING_FACILITY): Payer: Medicare Other | Admitting: Adult Health

## 2015-11-12 ENCOUNTER — Encounter: Payer: Self-pay | Admitting: Adult Health

## 2015-11-12 DIAGNOSIS — L8995 Pressure ulcer of unspecified site, unstageable: Secondary | ICD-10-CM | POA: Diagnosis not present

## 2015-11-12 DIAGNOSIS — F0390 Unspecified dementia without behavioral disturbance: Secondary | ICD-10-CM | POA: Diagnosis not present

## 2015-11-12 DIAGNOSIS — M179 Osteoarthritis of knee, unspecified: Secondary | ICD-10-CM

## 2015-11-12 DIAGNOSIS — F329 Major depressive disorder, single episode, unspecified: Secondary | ICD-10-CM

## 2015-11-12 DIAGNOSIS — E785 Hyperlipidemia, unspecified: Secondary | ICD-10-CM | POA: Diagnosis not present

## 2015-11-12 DIAGNOSIS — IMO0002 Reserved for concepts with insufficient information to code with codable children: Secondary | ICD-10-CM

## 2015-11-12 DIAGNOSIS — E46 Unspecified protein-calorie malnutrition: Secondary | ICD-10-CM | POA: Diagnosis not present

## 2015-11-12 DIAGNOSIS — M1710 Unilateral primary osteoarthritis, unspecified knee: Secondary | ICD-10-CM

## 2015-11-12 DIAGNOSIS — F32A Depression, unspecified: Secondary | ICD-10-CM

## 2015-11-12 DIAGNOSIS — E118 Type 2 diabetes mellitus with unspecified complications: Secondary | ICD-10-CM

## 2015-11-12 DIAGNOSIS — I350 Nonrheumatic aortic (valve) stenosis: Secondary | ICD-10-CM

## 2015-11-12 DIAGNOSIS — I10 Essential (primary) hypertension: Secondary | ICD-10-CM

## 2015-11-12 NOTE — Progress Notes (Signed)
Patient ID: [name], female   DOB: [birth year], 82 y.o.   MRN: [id number]    DATE:    11/12/15   MRN:  [id number]  BIRTHDAY: 1930/07/04  Facility:  Nursing Home Location:  [facility]  Nursing Home Room Number: 203-2  LEVEL OF CARE:  SNF [phone])  Contact Information    Name Relation Home Work [place] Daughter [phone]  [phone]   Chevis Pretty   [phone]   Steva Colder   [phone]       Code Status History    Date Active Date Inactive Code Status Order ID Comments User Context   10/14/2015  8:50 PM 10/21/2015  4:06 PM Full Code [id number]  Briscoe Deutscher, MD ED   05/17/2015  7:27 PM 05/24/2015  5:49 PM Full Code [id number]  Jeralyn Bennett, MD Inpatient   11/23/2013  8:36 PM 11/24/2013 11:52 PM Full Code [id number]  Martha Clan, MD Inpatient       Chief Complaint  Patient presents with  . Medical Management of Chronic Issues    HISTORY OF PRESENT ILLNESS:  This is an 82 year old female who is being seen for a routine visit. She recently completed antibiotic course for sacral ulcer infection. Treatment is done daily. Physiatry consult has been ordered for pain management.  She was recently readmitted to [facility] on 10/21/15 from [facility]. She has PMH of CAD with stents placed in 2005, AV nodal block S/P pacer insertion, severe AF and advanced Alzheimer's dementia. She was treated in the hospital for sepsis secondary to lactobacillus and enterococcus UTI, bacillus bacteremia. Sepsis was present on admission with HR > 90, elevated lactic acid and suspected source UTI. She was treated with Maxipime then changed to Levaquin 2/16. Repeat blood and urine cultures showed no growth.  She has been admitted for a long-term care.  PAST MEDICAL HISTORY:  Past Medical History  Diagnosis Date  . DM (diabetes mellitus) (HCC)   . Hyperlipidemia   . HTN (hypertension)   . CAD (coronary artery disease)     s/p  NSTEMI 2003 with stent placement OM1.  PTCA with cutting balloon prox LAD. 2005 admission with chest pain, BMS placed to CFX and DES placed in RCA  . Aortic stenosis     moderate to severe  . Anemia of chronic disease   . CRI (chronic renal insufficiency)   . CVA (cerebral infarction)     remote; recovered  . Second degree Mobitz II AV block     s/p PPM 12/2009  . Hx of colonoscopy   . Headache 2003  . GERD (gastroesophageal reflux disease)   . Alzheimer's disease 07/2011  . PAD (peripheral artery disease) (HCC)   . Atherosclerosis   . Osteoporosis   . Fall (on)(from) incline, initial encounter   . Atherosclerosis of artery   . Pressure ulcer   . UTI (lower urinary tract infection)   . Thrombocytopenia (HCC)   . Hypernatremia   . Generalized weakness   . Right arm pain   . Protein calorie malnutrition (HCC)   . Chronic constipation      CURRENT MEDICATIONS: Reviewed  Patient's Medications  New Prescriptions   No medications on file  Previous Medications   ACETAMINOPHEN (TYLENOL) 325 MG TABLET    Take 650 mg by mouth every 4 (four) hours as needed for mild pain (For pain or temperature greater than 99.5 orally).    ACETAMINOPHEN (TYLENOL) 325 MG TABLET  Take 650 mg by mouth daily at 12 noon. Give at noon   ASPIRIN EC 81 MG TABLET    Take 81 mg by mouth daily.   ATORVASTATIN (LIPITOR) 40 MG TABLET    Take 40 mg by mouth daily.    CALCIUM-VITAMIN D (OSCAL WITH D) 500-200 MG-UNIT TABLET    Take 1 tablet by mouth 2 (two) times daily.   DICLOFENAC SODIUM (VOLTAREN) 1 % GEL    Apply 2 g topically 4 (four) times daily as needed (pain).   DONEPEZIL (ARICEPT ODT) 10 MG DISINTEGRATING TABLET    Take 10 mg by mouth at bedtime.   HYDROCODONE-ACETAMINOPHEN (NORCO/VICODIN) 5-325 MG TABLET    Take 1 tablet by mouth daily as needed for moderate pain (Give prior to dressing change per wound care nurse).   HYDROCODONE-ACETAMINOPHEN (NORCO/VICODIN) 5-325 MG TABLET    Take 1 tablet by mouth 2  (two) times daily. Give at 6AM and 6PM   LAVENDER OIL OIL    S/O- INSOMINIA /AGITATION/ RESTLESSNESS/ ANXIETY- Apply a drop of lavendar oil or [place] Oil behind each ear per standing order. Repeat PRN. Discontinue use if skin irritation occurs.   MIRTAZAPINE (REMERON) 15 MG TABLET    Take 15 mg by mouth at bedtime.   MULTIPLE VITAMINS-MINERALS (DECUBI-VITE) CAPS    Take 1 capsule by mouth daily.   NUTRITIONAL SUPPLEMENTS (NUTRITIONAL SHAKE PO)    Take by mouth 2 (two) times daily. Start Mighty Shake BID with MedPass (2PM, 8PM)   PROTEIN (PROCEL 100) POWD    Take 2 scoop by mouth 2 (two) times daily.   SACCHAROMYCES BOULARDII (FLORASTOR) 250 MG CAPSULE    Take 250 mg by mouth 2 (two) times daily. For 13 days   UNABLE TO FIND    Med Name: MedPass 2.0 90 mL TID for nutritional support  Modified Medications   No medications on file  Discontinued Medications   DOXYCYCLINE (VIBRA-TABS) 100 MG TABLET    Take 100 mg by mouth 2 (two) times daily. For 10 (ten) days   HYDROCODONE-ACETAMINOPHEN (NORCO/VICODIN) 5-325 MG TABLET    Take one tablet by mouth twice daily for pain. Do not exceed 4gm of Tylenol in 24 hours     Allergies  Allergen Reactions  . Latex Rash     REVIEW OF SYSTEMS:  Unabel to obtain due to advanced alzheimer's dementia   PHYSICAL EXAMINATION  GENERAL APPEARANCE:  In no acute distress.  SKIN:  Has sacral pressure ulcer, unstageable HEAD: Normal in size and contour. No evidence of trauma EYES: Lids open and close normally. No blepharitis, entropion or ectropion. PERRL. Conjunctivae are clear and sclerae are white. Lenses are without opacity EARS: Pinnae are normal. Patient hears normal voice tunes of the examiner MOUTH and THROAT: Lips are without lesions. Oral mucosa is moist and without lesions. Tongue is normal in shape, size, and color and without lesions NECK: supple, trachea midline, no neck masses, no thyroid tenderness, no thyromegaly LYMPHATICS: no LAN in the neck,  no supraclavicular LAN RESPIRATORY: breathing is even & unlabored, BS CTAB CARDIAC: RRR, + murmur,no extra heart sounds, no edema GI: abdomen soft, normal BS, no masses, no tenderness, no hepatomegaly, no splenomegaly GU:  Has FC intact draining to urine bag EXTREMITIES:  Able to move BUE and has generalized weakness on BLE PSYCHIATRIC: Alert and oriented to self. Affect and behavior are appropriate  LABS/RADIOLOGY: Labs reviewed: Basic Metabolic Panel:  Recent Labs  16/05/9608/23/16 0428  10/18/15 0420 10/19/15 0747 10/20/15 [id number]  10/23/15  NA 144  < > 150* 141 142 139  K 3.0*  < > 3.3* 4.0 3.9 3.7  CL 108  < > 120* 113* 109  --   CO2 28  < > --   GLUCOSE 138*  < > 122* 182* 124*  --   BUN 39*  < > 22* CREATININE 1.34*  < > 0.74 0.77 0.72 0.8  CALCIUM 9.7  < > 9.6 9.2 9.7  --   MG 1.8  --   --   --   --   --   < > = values in this interval not displayed. Liver Function Tests:  Recent Labs  06/07/15 2252 08/14/15 10/14/15 1529 10/15/15 0649  AST 30 25 65* 51*  ALT 19 26 59* 48  ALKPHOS 77 123 89 72  BILITOT 1.1  --  0.8 0.7  PROT 7.1  --  7.2 6.3*  ALBUMIN 2.9*  --  2.7* 2.3*   CBC:  Recent Labs  09/24/15  10/15/15 0649 10/18/15 0420 10/19/15 0747 10/20/15 0648 10/23/15  WBC 6.9  < > 7.9 7.0 6.9 6.3 6.4  NEUTROABS 3  --  4.9  --   --   --  4  HGB 12.1  < > 11.9* 10.2* 9.8* 10.9* 10.2*  HCT 41  < > 39.5 34.9* 32.2* 35.1* 33*  MCV  --   < > 81.4 82.5 79.7 78.5  --   PLT 288  < > 91* 117* 128* 144* 222  < > = values in this interval not displayed.  Cardiac Enzymes:  Recent Labs  05/17/15 1306  TROPONINI 0.08*   CBG:  Recent Labs  10/19/15 0749 10/20/15 0731 10/21/15 0721  GLUCAP 166* 112* 126*      ASSESSMENT/PLAN:  Diabetes mellitus, type II - hemoglobin A1c 6.0; on no medication  Hypertension - stable; on no medication  Severe aortic stenosis - TTE on 05/23/15 demonstrates severe AS; continue aspirin 81 mg daily  Dementia -  continue Aricept 10 mg 1 tab by mouth daily at HS  Hyperlipidemia - continue Lipitor 40 mg 1 tab by mouth daily  Sacral pressure ulcer - continue decubivite 1 tab daily; continue wound treatment and air mattress  Depression - continue Remeron 15 mg 1 tab by mouth daily at bedtime  Osteoarthrosis - continue Voltaren gel 1% apply 2 g to BLE 4 times a day when necessary and acetaminophen 325 mg take 2 tabs = 650 mg by mouth every 4 hours when necessary and Norco 5/325 mg 1 tab by mouth daily when necessary whenever wound care is being done  Protein calorie malnutrition, severe - albumin 2.3; continue Procel 2 scoops by mouth twice a day     Goals of care:  Long-term care   [facility], NP [facility] [phone]

## 2015-11-18 ENCOUNTER — Non-Acute Institutional Stay (SKILLED_NURSING_FACILITY): Payer: Medicare Other | Admitting: Adult Health

## 2015-11-18 ENCOUNTER — Encounter: Payer: Self-pay | Admitting: Adult Health

## 2015-11-18 DIAGNOSIS — T148XXA Other injury of unspecified body region, initial encounter: Principal | ICD-10-CM

## 2015-11-18 DIAGNOSIS — T148 Other injury of unspecified body region: Secondary | ICD-10-CM | POA: Diagnosis not present

## 2015-11-18 DIAGNOSIS — L089 Local infection of the skin and subcutaneous tissue, unspecified: Secondary | ICD-10-CM | POA: Diagnosis not present

## 2015-11-18 NOTE — Progress Notes (Signed)
Patient ID: [name], female   DOB: April 01, 1930, 82 y.o.   MRN: [id number]    DATE:    11/18/15   MRN:  [id number]  BIRTHDAY: 11-22-1929  Facility:  Nursing Home Location:  [facility]  Nursing Home Room Number: 203-2  LEVEL OF CARE:  SNF [phone]  Contact Information    Name Relation Home Work [place] Daughter [phone]  [phone]   Chevis Pretty   [phone]   Steva Colder   [phone]       Code Status History    Date Active Date Inactive Code Status Order ID Comments User Context   10/14/2015  8:50 PM 10/21/2015  4:06 PM Full Code [id number]  Briscoe Deutscher, MD ED   05/17/2015  7:27 PM 05/24/2015  5:49 PM Full Code [id number]  Jeralyn Bennett, MD Inpatient   11/23/2013  8:36 PM 11/24/2013 11:52 PM Full Code [id number]  Martha Clan, MD Inpatient       Chief Complaint  Patient presents with  . Acute Visit    Infected sacral pressure ulcer    HISTORY OF PRESENT ILLNESS:  This is an 82 year old female who was noted to have her sacral wound having undermining  o'clock and goes back to 12 o'clock with depth of undermining measuring 2 cm. Small amount of serosanguinous exudate was noted on the dressing. Erythematous tissue surrounding the ulcer.  PAST MEDICAL HISTORY:  Past Medical History  Diagnosis Date  . DM (diabetes mellitus) (HCC)   . Hyperlipidemia   . HTN (hypertension)   . CAD (coronary artery disease)     s/p NSTEMI 2003 with stent placement OM1.  PTCA with cutting balloon prox LAD. 2005 admission with chest pain, BMS placed to CFX and DES placed in RCA  . Aortic stenosis     moderate to severe  . Anemia of chronic disease   . CRI (chronic renal insufficiency)   . CVA (cerebral infarction)     remote; recovered  . Second degree Mobitz II AV block     s/p PPM 12/2009  . Hx of colonoscopy   . Headache 2003  . GERD (gastroesophageal reflux disease)   . Alzheimer's disease 07/2011  . PAD (peripheral  artery disease) (HCC)   . Atherosclerosis   . Osteoporosis   . Fall (on)(from) incline, initial encounter   . Atherosclerosis of artery   . Pressure ulcer   . UTI (lower urinary tract infection)   . Thrombocytopenia (HCC)   . Hypernatremia   . Generalized weakness   . Right arm pain   . Protein calorie malnutrition (HCC)   . Chronic constipation      CURRENT MEDICATIONS: Reviewed  Patient's Medications  New Prescriptions   No medications on file  Previous Medications   ACETAMINOPHEN (TYLENOL) 325 MG TABLET    Take 650 mg by mouth every 4 (four) hours as needed for mild pain (For pain or temperature greater than 99.5 orally).    ACETAMINOPHEN (TYLENOL) 325 MG TABLET    Take 650 mg by mouth daily at 12 noon. Give at noon   ASPIRIN EC 81 MG TABLET    Take 81 mg by mouth daily.   ATORVASTATIN (LIPITOR) 40 MG TABLET    Take 40 mg by mouth daily.    CALCIUM-VITAMIN D (OSCAL WITH D) 500-200 MG-UNIT TABLET    Take 1 tablet by mouth 2 (two) times daily.   DICLOFENAC SODIUM (VOLTAREN) 1 % GEL    Apply  2 g topically 4 (four) times daily as needed (pain).   DONEPEZIL (ARICEPT ODT) 10 MG DISINTEGRATING TABLET    Take 10 mg by mouth at bedtime.           HYDROCODONE-ACETAMINOPHEN (NORCO/VICODIN) 5-325 MG TABLET    Take 1 tablet by mouth daily as needed for moderate pain (Give prior to dressing change per wound care nurse).   HYDROCODONE-ACETAMINOPHEN (NORCO/VICODIN) 5-325 MG TABLET    Take 1 tablet by mouth 2 (two) times daily. Give at 6AM and 6PM   LAVENDER OIL OIL    S/O- INSOMINIA /AGITATION/ RESTLESSNESS/ ANXIETY- Apply a drop of lavendar oil or [place] Oil behind each ear per standing order. Repeat PRN. Discontinue use if skin irritation occurs.       MIRTAZAPINE (REMERON) 15 MG TABLET    Take 15 mg by mouth at bedtime.   MULTIPLE VITAMINS-MINERALS (DECUBI-VITE) CAPS    Take 1 capsule by mouth daily.   NUTRITIONAL SUPPLEMENTS (NUTRITIONAL SHAKE PO)    Take by mouth 2 (two) times daily.  Start Mighty Shake BID with MedPass (2PM, 8PM)   POLYETHYLENE GLYCOL (MIRALAX / GLYCOLAX) PACKET    Take 17 g by mouth 2 (two) times daily.   PROTEIN (PROCEL 100) POWD    Take 2 scoop by mouth 2 (two) times daily.   SACCHAROMYCES BOULARDII (FLORASTOR) 250 MG CAPSULE    Take 250 mg by mouth 2 (two) times daily. Take for one week   SENNA-DOCUSATE (SENNA S) 8.6-50 MG TABLET    Take 2 tablets by mouth 2 (two) times daily.   UNABLE TO FIND    Med Name: MedPass 2.0 90 mL TID for nutritional support  Modified Medications   No medications on file     DOXYCYCLINE (VIBRAMYCIN) 100 MG CAPSULE    Take 100 mg by mouth 2 (two) times daily. Give for 14 days for sacral wound infection   SACCHAROMYCES BOULARDII (FLORASTOR) 250 MG CAPSULE    Take 250 mg by mouth 2 (two) times daily. For 17 days for GI prophylaxis    Allergies  Allergen Reactions  . Latex Rash     REVIEW OF SYSTEMS:  Unable to obtain due to advanced alzheimer's dementia   PHYSICAL EXAMINATION  GENERAL APPEARANCE:  In no acute distress.  SKIN:  See HPI HEAD: Normal in size and contour. No evidence of trauma EYES: Lids open and close normally. No blepharitis, entropion or ectropion. PERRL. Conjunctivae are clear and sclerae are white. Lenses are without opacity EARS: Pinnae are normal. Patient hears normal voice tunes of the examiner MOUTH and THROAT: Lips are without lesions. Oral mucosa is moist and without lesions. Tongue is normal in shape, size, and color and without lesions NECK: supple, trachea midline, no neck masses, no thyroid tenderness, no thyromegaly LYMPHATICS: no LAN in the neck, no supraclavicular LAN RESPIRATORY: breathing is even & unlabored, BS CTAB CARDIAC: RRR, + murmur,no extra heart sounds, no edema GI: abdomen soft, normal BS, no masses, no tenderness, no hepatomegaly, no splenomegaly GU:  Has FC intact draining to urine bag EXTREMITIES:  Able to move BUE and has generalized weakness on BLE PSYCHIATRIC: Alert  and oriented to self. Affect and behavior are appropriate  LABS/RADIOLOGY: Labs reviewed: Basic Metabolic Panel:  Recent Labs  11/91/47 0428  10/18/15 0420 10/19/15 0747 10/20/15 0648      NA 144  < > 150* 141 142      K 3.0*  < > 3.3* 4.0 3.9  CL 108  < > 120* 113* 109      CO2 28  < > 22 22 26       GLUCOSE 138*  < > 122* 182* 124*      BUN 39*  < > 22* 17 13      CREATININE 1.34*  < > 0.74 0.77 0.72      CALCIUM 9.7  < > 9.6 9.2 9.7      MG 1.8  --   --   --   --       < > = values in this interval not displayed. Liver Function Tests:  Recent Labs  06/07/15 2252 08/14/15 10/14/15 1529 10/15/15 0649  AST 30 25 65* 51*  ALT 19 26 59* 48  ALKPHOS 77 123 89 72  BILITOT 1.1  --  0.8 0.7  PROT 7.1  --  7.2 6.3*  ALBUMIN 2.9*  --  2.7* 2.3*   CBC:  Recent Labs  10/18/15 0420 10/19/15 0747 10/20/15 0648 10/23/15 11/06/15    WBC 7.0 6.9 6.3 6.4 9.0    NEUTROABS  --   --   --  4 6    HGB 10.2* 9.8* 10.9* 10.2* 9.6*    HCT 34.9* 32.2* 35.1* 33* 32*    MCV 82.5 79.7 78.5  --   --     PLT 117* 128* 144* 222 361     Cardiac Enzymes:  Recent Labs  05/17/15 1306  TROPONINI 0.08*   CBG:  Recent Labs  10/19/15 0749 10/20/15 0731 10/21/15 0721  GLUCAP 166* 112* 126*      ASSESSMENT/PLAN:  Infected Sacral wound - Start doxycycline 100 mg by mouth twice a day 14 days and Florastor 250 mg 1 capsule by mouth twice a day 17 days; x-ray of lumbosacral and culture and sensitivity of wound drainage     MEDINA-VARGAS,Dashayla Theissen, NP [facility] [phone]

## 2015-11-20 LAB — CBC AND DIFFERENTIAL
HCT: 32 % — AB (ref 36–46)
HEMOGLOBIN: 9.3 g/dL — AB (ref 12.0–16.0)
NEUTROS ABS: 5 /uL
PLATELETS: 391 10*3/uL (ref 150–399)
WBC: 8.4 10^3/mL

## 2015-11-20 LAB — BASIC METABOLIC PANEL
BUN: 10 mg/dL (ref 4–21)
Creatinine: 0.7 mg/dL (ref 0.5–1.1)
GLUCOSE: 121 mg/dL
Potassium: 4.2 mmol/L (ref 3.4–5.3)
SODIUM: 142 mmol/L (ref 137–147)

## 2015-11-25 ENCOUNTER — Encounter: Payer: Self-pay | Admitting: Adult Health

## 2015-11-25 ENCOUNTER — Non-Acute Institutional Stay (SKILLED_NURSING_FACILITY): Payer: Medicare Other | Admitting: Adult Health

## 2015-11-25 DIAGNOSIS — T148 Other injury of unspecified body region: Secondary | ICD-10-CM

## 2015-11-25 DIAGNOSIS — T148XXA Other injury of unspecified body region, initial encounter: Principal | ICD-10-CM

## 2015-11-25 DIAGNOSIS — L089 Local infection of the skin and subcutaneous tissue, unspecified: Secondary | ICD-10-CM | POA: Diagnosis not present

## 2015-11-25 NOTE — Progress Notes (Signed)
Patient ID: [name], female   DOB: [birth year], 82 y.o.   MRN: [id number]    DATE:    11/25/15   MRN:  [id number]  BIRTHDAY: 12-May-1930  Facility:  Nursing Home Location:  [facility]  Nursing Home Room Number: 203-2  LEVEL OF CARE:  SNF [phone])  Contact Information    Name Relation Home Work [place] Daughter [phone]  [phone]   Chevis Pretty   [phone]   Steva Colder   [phone]       Code Status History    Date Active Date Inactive Code Status Order ID Comments User Context   10/14/2015  8:50 PM 10/21/2015  4:06 PM Full Code [id number]  Briscoe Deutscher, MD ED   05/17/2015  7:27 PM 05/24/2015  5:49 PM Full Code [id number]  Jeralyn Bennett, MD Inpatient   11/23/2013  8:36 PM 11/24/2013 11:52 PM Full Code [id number]  Martha Clan, MD Inpatient       Chief Complaint  Patient presents with  . Acute Visit    Sacral wound infection    HISTORY OF PRESENT ILLNESS:  This is an 82 year old female who was recently started on Doxycycline for sacral wound infection. Wound culture of drainage was obtained before starting the antibiotic. Wound culture showed 2 strains of enterococcus present , light growth Enterococcus faecalis and gram stain many white blood cells, many gram-positive cocci.  PAST MEDICAL HISTORY:  Past Medical History  Diagnosis Date  . DM (diabetes mellitus) (HCC)   . Hyperlipidemia   . HTN (hypertension)   . CAD (coronary artery disease)     s/p NSTEMI 2003 with stent placement OM1.  PTCA with cutting balloon prox LAD. 2005 admission with chest pain, BMS placed to CFX and DES placed in RCA  . Aortic stenosis     moderate to severe  . Anemia of chronic disease   . CRI (chronic renal insufficiency)   . CVA (cerebral infarction)     remote; recovered  . Second degree Mobitz II AV block     s/p PPM 12/2009  . Hx of colonoscopy   . Headache 2003  . GERD (gastroesophageal reflux disease)   . Alzheimer's  disease 07/2011  . PAD (peripheral artery disease) (HCC)   . Atherosclerosis   . Osteoporosis   . Fall (on)(from) incline, initial encounter   . Atherosclerosis of artery   . Pressure ulcer   . UTI (lower urinary tract infection)   . Thrombocytopenia (HCC)   . Hypernatremia   . Generalized weakness   . Right arm pain   . Protein calorie malnutrition (HCC)   . Chronic constipation      CURRENT MEDICATIONS: Reviewed  Patient's Medications  New Prescriptions   No medications on file  Previous Medications   ACETAMINOPHEN (TYLENOL) 325 MG TABLET    Take 650 mg by mouth every 4 (four) hours as needed for mild pain (For pain or temperature greater than 99.5 orally).    ACETAMINOPHEN (TYLENOL) 325 MG TABLET    Take 650 mg by mouth daily at 12 noon. Give at noon   ASPIRIN EC 81 MG TABLET    Take 81 mg by mouth daily.   ATORVASTATIN (LIPITOR) 40 MG TABLET    Take 40 mg by mouth daily.    CALCIUM-VITAMIN D (OSCAL WITH D) 500-200 MG-UNIT TABLET    Take 1 tablet by mouth 2 (two) times daily.   DICLOFENAC SODIUM (VOLTAREN) 1 % GEL  Apply 2 g topically 4 (four) times daily as needed (pain).   DONEPEZIL (ARICEPT ODT) 10 MG DISINTEGRATING TABLET    Take 10 mg by mouth at bedtime.           HYDROCODONE-ACETAMINOPHEN (NORCO/VICODIN) 5-325 MG TABLET    Take 1 tablet by mouth daily as needed for moderate pain (Give prior to dressing change per wound care nurse).   HYDROCODONE-ACETAMINOPHEN (NORCO/VICODIN) 5-325 MG TABLET    Take 1 tablet by mouth 2 (two) times daily. Give at 6AM and 6PM   LAVENDER OIL OIL    S/O- INSOMINIA /AGITATION/ RESTLESSNESS/ ANXIETY- Apply a drop of lavendar oil or [place] Oil behind each ear per standing order. Repeat PRN. Discontinue use if skin irritation occurs.       MIRTAZAPINE (REMERON) 15 MG TABLET    Take 15 mg by mouth at bedtime.   MULTIPLE VITAMINS-MINERALS (DECUBI-VITE) CAPS    Take 1 capsule by mouth daily.   NUTRITIONAL SUPPLEMENTS (NUTRITIONAL SHAKE PO)     Take by mouth 2 (two) times daily. Start Mighty Shake BID with MedPass (2PM, 8PM)   POLYETHYLENE GLYCOL (MIRALAX / GLYCOLAX) PACKET    Take 17 g by mouth 2 (two) times daily.   PROTEIN (PROCEL 100) POWD    Take 2 scoop by mouth 2 (two) times daily.       SENNA-DOCUSATE (SENNA S) 8.6-50 MG TABLET    Take 2 tablets by mouth 2 (two) times daily.   UNABLE TO FIND    Med Name: MedPass 2.0 90 mL TID for nutritional support  Modified Medications   No medications on file     AMPICILLIN (PRINCIPEN) 500 MG CAPSULE    Take 500 mg by mouth 4 (four) times daily. Take for 2 weeks       SACCHAROMYCES BOULARDII (FLORASTOR) 250 MG CAPSULE    Take 250 mg by mouth 2 (two) times daily. For 17 days for GI prophylaxis    Allergies  Allergen Reactions  . Latex Rash     REVIEW OF SYSTEMS:  Unabel to obtain due to advanced alzheimer's dementia   PHYSICAL EXAMINATION  GENERAL APPEARANCE:  In no acute distress.  SKIN:  Sacral pressure ulcer with dressing HEAD: Normal in size and contour. No evidence of trauma EYES: Lids open and close normally. No blepharitis, entropion or ectropion. PERRL. Conjunctivae are clear and sclerae are white. Lenses are without opacity EARS: Pinnae are normal. Patient hears normal voice tunes of the examiner MOUTH and THROAT: Lips are without lesions. Oral mucosa is moist and without lesions. Tongue is normal in shape, size, and color and without lesions NECK: supple, trachea midline, no neck masses, no thyroid tenderness, no thyromegaly LYMPHATICS: no LAN in the neck, no supraclavicular LAN RESPIRATORY: breathing is even & unlabored, BS CTAB CARDIAC: RRR, + murmur,no extra heart sounds, no edema GI: abdomen soft, normal BS, no masses, no tenderness, no hepatomegaly, no splenomegaly GU:  Has FC intact draining to urine bag EXTREMITIES:  Able to move BUE and has generalized weakness on BLE PSYCHIATRIC: Alert and oriented to self. Affect and behavior are  appropriate  LABS/RADIOLOGY: Labs reviewed: Basic Metabolic Panel:  Recent Labs  16/10/96 0428  10/18/15 0420 10/19/15 0747 10/20/15 0648  11/20/15    NA 144  < > 150* 141 142  < > 142    K 3.0*  < > 3.3* 4.0 3.9  < > 4.2    CL 108  < > 120* 113* 109  --   --  CO2 28  < > 22 22 26   --   --     GLUCOSE 138*  < > 122* 182* 124*  --   --     BUN 39*  < > 22* 17 13  < > 10    CREATININE 1.34*  < > 0.74 0.77 0.72  < > 0.7    CALCIUM 9.7  < > 9.6 9.2 9.7  --   --     MG 1.8  --   --   --   --   --   --     < > = values in this interval not displayed. Liver Function Tests:  Recent Labs  06/07/15 2252 08/14/15 10/14/15 1529 10/15/15 0649  AST 30 25 65* 51*  ALT 19 26 59* 48  ALKPHOS 77 123 89 72  BILITOT 1.1  --  0.8 0.7  PROT 7.1  --  7.2 6.3*  ALBUMIN 2.9*  --  2.7* 2.3*   CBC:  Recent Labs  10/18/15 0420 10/19/15 0747 10/20/15 0648 10/23/15 11/06/15 11/20/15   WBC 7.0 6.9 6.3 6.4 9.0 8.4   NEUTROABS  --   --   --  4 6 5    HGB 10.2* 9.8* 10.9* 10.2* 9.6* 9.3*   HCT 34.9* 32.2* 35.1* 33* 32* 32*   MCV 82.5 79.7 78.5  --   --   --    PLT 117* 128* 144* 222 361 391    Cardiac Enzymes:  Recent Labs  05/17/15 1306  TROPONINI 0.08*   CBG:  Recent Labs  10/19/15 0749 10/20/15 0731 10/21/15 0721  GLUCAP 166* 112* 126*      ASSESSMENT/PLAN:  Sacral wound infection - Discontinue doxycycline and start ampicillin 500 mg 1 capsule by mouth every 6 hours 2 weeks and continue Florastor 250 mg 1 capsule by mouth twice a day 17 days; follow-up with Wound Clinic   [facility],MONINA, NP [facility] [phone]

## 2015-11-29 LAB — BASIC METABOLIC PANEL
BUN: 13 mg/dL (ref 4–21)
CREATININE: 0.8 mg/dL (ref 0.5–1.1)
Glucose: 90 mg/dL
POTASSIUM: 4.5 mmol/L (ref 3.4–5.3)
Sodium: 141 mmol/L (ref 137–147)

## 2015-12-04 ENCOUNTER — Encounter (HOSPITAL_BASED_OUTPATIENT_CLINIC_OR_DEPARTMENT_OTHER): Payer: Medicare Other

## 2015-12-05 ENCOUNTER — Other Ambulatory Visit: Payer: Self-pay | Admitting: Internal Medicine

## 2015-12-05 ENCOUNTER — Encounter (HOSPITAL_BASED_OUTPATIENT_CLINIC_OR_DEPARTMENT_OTHER): Payer: No Typology Code available for payment source | Attending: Internal Medicine

## 2015-12-05 DIAGNOSIS — L89154 Pressure ulcer of sacral region, stage 4: Secondary | ICD-10-CM | POA: Insufficient documentation

## 2015-12-05 DIAGNOSIS — G309 Alzheimer's disease, unspecified: Secondary | ICD-10-CM | POA: Insufficient documentation

## 2015-12-05 DIAGNOSIS — Z950 Presence of cardiac pacemaker: Secondary | ICD-10-CM | POA: Diagnosis not present

## 2015-12-05 DIAGNOSIS — Z8673 Personal history of transient ischemic attack (TIA), and cerebral infarction without residual deficits: Secondary | ICD-10-CM | POA: Diagnosis not present

## 2015-12-05 DIAGNOSIS — E46 Unspecified protein-calorie malnutrition: Secondary | ICD-10-CM | POA: Insufficient documentation

## 2015-12-05 DIAGNOSIS — Z6836 Body mass index (BMI) 36.0-36.9, adult: Secondary | ICD-10-CM | POA: Diagnosis not present

## 2015-12-05 DIAGNOSIS — Z96659 Presence of unspecified artificial knee joint: Secondary | ICD-10-CM | POA: Insufficient documentation

## 2015-12-05 DIAGNOSIS — I2510 Atherosclerotic heart disease of native coronary artery without angina pectoris: Secondary | ICD-10-CM | POA: Insufficient documentation

## 2015-12-05 DIAGNOSIS — E1122 Type 2 diabetes mellitus with diabetic chronic kidney disease: Secondary | ICD-10-CM | POA: Diagnosis not present

## 2015-12-05 DIAGNOSIS — N189 Chronic kidney disease, unspecified: Secondary | ICD-10-CM | POA: Diagnosis not present

## 2015-12-05 DIAGNOSIS — Z87891 Personal history of nicotine dependence: Secondary | ICD-10-CM | POA: Diagnosis not present

## 2015-12-05 DIAGNOSIS — M869 Osteomyelitis, unspecified: Secondary | ICD-10-CM | POA: Insufficient documentation

## 2015-12-05 DIAGNOSIS — I350 Nonrheumatic aortic (valve) stenosis: Secondary | ICD-10-CM | POA: Diagnosis not present

## 2015-12-05 DIAGNOSIS — Z955 Presence of coronary angioplasty implant and graft: Secondary | ICD-10-CM | POA: Insufficient documentation

## 2015-12-05 DIAGNOSIS — F0280 Dementia in other diseases classified elsewhere without behavioral disturbance: Secondary | ICD-10-CM | POA: Insufficient documentation

## 2015-12-05 DIAGNOSIS — B9620 Unspecified Escherichia coli [E. coli] as the cause of diseases classified elsewhere: Secondary | ICD-10-CM | POA: Diagnosis not present

## 2015-12-10 LAB — CBC AND DIFFERENTIAL
HEMATOCRIT: 30 % — AB (ref 36–46)
HEMOGLOBIN: 8.1 g/dL — AB (ref 12.0–16.0)
Platelets: 475 10*3/uL — AB (ref 150–399)
WBC: 10.6 10^3/mL

## 2015-12-10 LAB — BASIC METABOLIC PANEL
BUN: 14 mg/dL (ref 4–21)
Creatinine: 0.8 mg/dL (ref 0.5–1.1)
GLUCOSE: 108 mg/dL
POTASSIUM: 4.2 mmol/L (ref 3.4–5.3)
SODIUM: 146 mmol/L (ref 137–147)

## 2015-12-11 ENCOUNTER — Non-Acute Institutional Stay (SKILLED_NURSING_FACILITY): Payer: Medicare Other | Admitting: Internal Medicine

## 2015-12-11 ENCOUNTER — Encounter: Payer: Self-pay | Admitting: Internal Medicine

## 2015-12-11 DIAGNOSIS — L8994 Pressure ulcer of unspecified site, stage 4: Secondary | ICD-10-CM | POA: Diagnosis not present

## 2015-12-11 DIAGNOSIS — D509 Iron deficiency anemia, unspecified: Secondary | ICD-10-CM | POA: Diagnosis not present

## 2015-12-11 DIAGNOSIS — E870 Hyperosmolality and hypernatremia: Secondary | ICD-10-CM | POA: Diagnosis not present

## 2015-12-11 DIAGNOSIS — R000 Tachycardia, unspecified: Secondary | ICD-10-CM | POA: Diagnosis not present

## 2015-12-11 DIAGNOSIS — N3000 Acute cystitis without hematuria: Secondary | ICD-10-CM | POA: Diagnosis not present

## 2015-12-11 NOTE — Progress Notes (Signed)
Patient ID: [name], female   DOB: 06-30-1930, 82 y.o.   MRN: [id number]    LOCATION: [facility]  PCP: Garlan Fillers, MD    Goals of care: Advanced Directive information Advanced Directives 10/14/2015  Does patient have an advance directive? No  Would patient like information on creating an advanced directive? No - patient declined information       Extended Emergency Contact Information Primary Emergency Contact: Blackmon,Denise Address: [address]          [place] Home Phone: [phone] Mobile Phone: [phone] Relation: Daughter Secondary Emergency Contact: Hockett,Timothy Address: [address]          [place], [place] [address] [place] Phone: [phone] Relation: Son   Allergies  Allergen Reactions  . Latex Rash    Chief Complaint  Patient presents with  . Acute Visit    Worsening Anemia     HPI:  Patient is a 82 y.o. female seen today for worsening anemia and UTI. She has low grade temeprature and is tachycardic on exam. She is here for long term care. Of note she was in the hospital with urosepsis in 2/17 from lactobacillus and enterococcus UTI and acute kidney injury. She has medical history of AS, dementia, obesity. She is under total care. She is lying in her bed. Per staff, has poor po intake for few days now and has discolored and dirty looking urine. She does not participate in conversation. Unable to obtain ROS.   Review of Systems:  Unable to obtain    Past Medical History  Diagnosis Date  . DM (diabetes mellitus) (HCC)   . Hyperlipidemia   . HTN (hypertension)   . CAD (coronary artery disease)     s/p NSTEMI 2003 with stent placement OM1.  PTCA with cutting balloon prox LAD. 2005 admission with chest pain, BMS placed to CFX and DES placed in RCA  . Aortic stenosis     moderate to severe  . Anemia of chronic disease   . CRI (chronic renal insufficiency)   .  CVA (cerebral infarction)     remote; recovered  . Second degree Mobitz II AV block     s/p PPM 12/2009  . Hx of colonoscopy   . Headache 2003  . GERD (gastroesophageal reflux disease)   . Alzheimer's disease 07/2011  . PAD (peripheral artery disease) (HCC)   . Atherosclerosis   . Osteoporosis   . Fall (on)(from) incline, initial encounter   . Atherosclerosis of artery   . Pressure ulcer   . UTI (lower urinary tract infection)   . Thrombocytopenia (HCC)   . Hypernatremia   . Generalized weakness   . Right arm pain   . Protein calorie malnutrition (HCC)   . Chronic constipation     Medications:   Medication List       This list is accurate as of: 12/11/15  1:36 PM.  Always use your most recent med list.               acetaminophen 325 MG tablet  Commonly known as:  TYLENOL  Take 650 mg by mouth every 4 (four) hours as needed for mild pain (For pain or temperature greater than 99.5 orally).     acetaminophen 325 MG tablet  Commonly known as:  TYLENOL  Take 650 mg by mouth daily at 12 noon. Give at noon     aspirin EC 81 MG tablet  Take 81 mg by mouth daily.     atorvastatin 40 MG tablet  Commonly known as:  LIPITOR  Take 40 mg by mouth daily.     calcium-vitamin D 500-200 MG-UNIT tablet  Commonly known as:  OSCAL WITH D  Take 1 tablet by mouth 2 (two) times daily.     DECUBI-VITE Caps  Take 1 capsule by mouth daily.     diclofenac sodium 1 % Gel  Commonly known as:  VOLTAREN  Apply 2 g topically 4 (four) times daily as needed (pain).     donepezil 10 MG disintegrating tablet  Commonly known as:  ARICEPT ODT  Take 10 mg by mouth at bedtime.     HYDROcodone-acetaminophen 5-325 MG tablet  Commonly known as:  NORCO/VICODIN  Take 1 tablet by mouth daily as needed for moderate pain (Give prior to dressing change per wound care nurse).     HYDROcodone-acetaminophen 5-325 MG tablet  Commonly known as:  NORCO/VICODIN  Take 1 tablet by mouth 2 (two) times  daily. Give at 6AM and 6PM     Lavender Oil Oil  S/O- INSOMINIA /AGITATION/ RESTLESSNESS/ ANXIETY- Apply a drop of lavendar oil or [place] Oil behind each ear per standing order. Repeat PRN. Discontinue use if skin irritation occurs.     mirtazapine 15 MG tablet  Commonly known as:  REMERON  Take 15 mg by mouth at bedtime.     NUTRITIONAL SHAKE PO  Take by mouth 2 (two) times daily. Start Mighty Shake BID with MedPass (2PM, 8PM)     polyethylene glycol packet  Commonly known as:  MIRALAX / GLYCOLAX  Take 17 g by mouth 2 (two) times daily.     PROCEL 100 Powd  Take 2 scoop by mouth 2 (two) times daily.     SENNA S 8.6-50 MG tablet  Generic drug:  senna-docusate  Take 2 tablets by mouth 2 (two) times daily.     UNABLE TO FIND  Med Name: MedPass 2.0 90 mL TID for nutritional support         Physical Exam: Filed Vitals:   12/11/15 1326  BP: 125/65  Pulse: 106  Temp: 100.8 F (38.2 C)  TempSrc: Oral  Resp: 18  Height: 5\' 9"  (1.753 m)  Weight: 203 lb 6.4 oz (92.262 kg)  SpO2: 93%   Body mass index is 30.02 kg/(m^2).   General- elderly female, obese but chronically ill appearing, in no acute distress Head- normocephalic, atraumatic Nose- no maxillary or frontal sinus tenderness, no nasal discharge Throat- moist mucus membrane Eyes- no pallor, no icterus, no discharge Neck- no cervical lymphadenopathy, no jugular vein distension Cardiovascular- normal s1,s2, systolic murmur +, bilateral leg edema + Respiratory- bilateral clear to auscultation, no wheeze, no rhonchi, no crackles, no use of accessory muscles Abdomen- bowel sounds present, soft, non tender Musculoskeletal- generalized weakness, swelling to hands noted, no signs of infection Nurological- unable to assess Skin- warm and dry, sacral stage 4 pressure ulcer +     Labs reviewed: Basic Metabolic Panel:  Recent Labs  16/05/9608/23/16 0428  10/18/15 0420 10/19/15 0747 10/20/15 0648  11/20/15 11/29/15  12/10/15  NA 144  < > 150* 141 142  < > 142 141 146  K 3.0*  < > 3.3* 4.0 3.9  < > 4.2 4.5 4.2  CL 108  < > 120* 113* 109  --   --   --   --   CO2 28  < > 22 22 26   --   --   --   --  GLUCOSE 138*  < > 122* 182* 124*  --   --   --   --   BUN 39*  < > 22* 17 13  < > CREATININE 1.34*  < > 0.74 0.77 0.72  < > 0.7 0.8 0.8  CALCIUM 9.7  < > 9.6 9.2 9.7  --   --   --   --   MG 1.8  --   --   --   --   --   --   --   --   < > = values in this interval not displayed. Liver Function Tests:  Recent Labs  06/07/15 2252 08/14/15 10/14/15 1529 10/15/15 0649  AST 30 25 65* 51*  ALT 19 26 59* 48  ALKPHOS 77 123 89 72  BILITOT 1.1  --  0.8 0.7  PROT 7.1  --  7.2 6.3*  ALBUMIN 2.9*  --  2.7* 2.3*   No results for input(s): LIPASE, AMYLASE in the last 8760 hours. No results for input(s): AMMONIA in the last 8760 hours. CBC:  Recent Labs  10/18/15 0420 10/19/15 0747 10/20/15 0648 10/23/15 11/06/15 11/20/15 12/10/15  WBC 7.0 6.9 6.3 6.4 9.0 8.4 10.6  NEUTROABS  --   --   --  --   HGB 10.2* 9.8* 10.9* 10.2* 9.6* 9.3* 8.1*  HCT 34.9* 32.2* 35.1* 33* 32* 32* 30*  MCV 82.5 79.7 78.5  --   --   --   --   PLT 117* 128* 144* 222 361 391 475*    Assessment/Plan  Sinus tachycardia With infection from uti and hypovolemia. Start iv fluid D5 half normal saline @ 100 cc/hr x 1litre. Assistance with meal and encourage po intake. Monitor BP and HR q shift x 5 days  Acute cystitis With dirty u/a and yeast in urine culture. Start diflucan 100 mg daily x 3 days. Given her recent urosepsis and appearance of her urine, treat her empirically with levaquin 500 mg daily x 5 days. Add florastor  Hypernatremia With poor po intake. Start iv fluid as above and check bmp 12/13/15  Stage 4 Pressure ulcer Per wound care stable without drainage/ discharge or bleed, continue wound care.Gel overlay mattress, wound care and pressure ulcer prophylaxis. Continue decubivite. Monitor clinically for  signs of infection  Microcytic anemia Check iron panel. Start ferrous sulfate 325 mg bid and monitor   Labs/tests ordered: cbc, bmp 12/13/15  Family/ staff Communication: reviewed care plan with nursing supervisor    Oneal Grout, MD Internal Medicine [facility] [address] [place], [place] [address] Cell Phone (Monday-Friday 8 am - 5 pm): [phone] On Call: [phone] and follow prompts after 5 pm and on weekends Office Phone: [phone] Office Fax: [phone]

## 2015-12-12 ENCOUNTER — Non-Acute Institutional Stay (SKILLED_NURSING_FACILITY): Payer: Medicare Other | Admitting: Adult Health

## 2015-12-12 ENCOUNTER — Encounter: Payer: Self-pay | Admitting: Adult Health

## 2015-12-12 DIAGNOSIS — L8994 Pressure ulcer of unspecified site, stage 4: Secondary | ICD-10-CM | POA: Diagnosis not present

## 2015-12-12 DIAGNOSIS — I10 Essential (primary) hypertension: Secondary | ICD-10-CM

## 2015-12-12 DIAGNOSIS — N3000 Acute cystitis without hematuria: Secondary | ICD-10-CM | POA: Diagnosis not present

## 2015-12-12 DIAGNOSIS — L89154 Pressure ulcer of sacral region, stage 4: Secondary | ICD-10-CM | POA: Diagnosis not present

## 2015-12-12 DIAGNOSIS — E43 Unspecified severe protein-calorie malnutrition: Secondary | ICD-10-CM | POA: Diagnosis not present

## 2015-12-12 DIAGNOSIS — M1710 Unilateral primary osteoarthritis, unspecified knee: Secondary | ICD-10-CM

## 2015-12-12 DIAGNOSIS — D509 Iron deficiency anemia, unspecified: Secondary | ICD-10-CM

## 2015-12-12 DIAGNOSIS — E870 Hyperosmolality and hypernatremia: Secondary | ICD-10-CM

## 2015-12-12 DIAGNOSIS — M179 Osteoarthritis of knee, unspecified: Secondary | ICD-10-CM | POA: Diagnosis not present

## 2015-12-12 DIAGNOSIS — I350 Nonrheumatic aortic (valve) stenosis: Secondary | ICD-10-CM | POA: Diagnosis not present

## 2015-12-12 DIAGNOSIS — F329 Major depressive disorder, single episode, unspecified: Secondary | ICD-10-CM | POA: Diagnosis not present

## 2015-12-12 DIAGNOSIS — F32A Depression, unspecified: Secondary | ICD-10-CM

## 2015-12-12 DIAGNOSIS — E118 Type 2 diabetes mellitus with unspecified complications: Secondary | ICD-10-CM | POA: Diagnosis not present

## 2015-12-12 DIAGNOSIS — E785 Hyperlipidemia, unspecified: Secondary | ICD-10-CM | POA: Diagnosis not present

## 2015-12-12 DIAGNOSIS — F0390 Unspecified dementia without behavioral disturbance: Secondary | ICD-10-CM

## 2015-12-12 DIAGNOSIS — K5901 Slow transit constipation: Secondary | ICD-10-CM

## 2015-12-12 DIAGNOSIS — IMO0002 Reserved for concepts with insufficient information to code with codable children: Secondary | ICD-10-CM

## 2015-12-12 NOTE — Progress Notes (Signed)
Patient ID: [name], female   DOB: July 21, 1930, 82 y.o.   MRN: [id number]    DATE:    12/12/15  MRN:  [id number]  BIRTHDAY: 1929-12-30  Facility:  Nursing Home Location:  [facility]  Nursing Home Room Number: 203-2  LEVEL OF CARE:  SNF [id number])  Contact Information    Name Relation Home Work [place] Daughter [phone]  [phone]   Chevis Pretty   [phone]   Steva Colder   [phone]       Code Status History    Date Active Date Inactive Code Status Order ID Comments User Context   10/14/2015  8:50 PM 10/21/2015  4:06 PM Full Code [id number]  Briscoe Deutscher, MD ED   05/17/2015  7:27 PM 05/24/2015  5:49 PM Full Code [id number]  Jeralyn Bennett, MD Inpatient   11/23/2013  8:36 PM 11/24/2013 11:52 PM Full Code [id number]  Martha Clan, MD Inpatient       Chief Complaint  Patient presents with  . Medical Management of Chronic Issues    HISTORY OF PRESENT ILLNESS:  This is an 82 year old female who is being seen for a routine visit. She is currently being treated with IVF D5 1/2NS via clysis due to hypernatremia, NA 146. She has hgb 8.1 (from 9.3, 11/20/15) and was started on Iron. She is currently on Diflucan and Levaquin for UTI. She is [facility] for sacral wound consult.  She is a long-term care patient @ [facility].  PAST MEDICAL HISTORY:  Past Medical History  Diagnosis Date  . DM (diabetes mellitus) (HCC)   . Hyperlipidemia   . HTN (hypertension)   . CAD (coronary artery disease)     s/p NSTEMI 2003 with stent placement OM1.  PTCA with cutting balloon prox LAD. 2005 admission with chest pain, BMS placed to CFX and DES placed in RCA  . Aortic stenosis     moderate to severe  . Anemia of chronic disease   . CRI (chronic renal insufficiency)   . CVA (cerebral infarction)     remote; recovered  . Second degree Mobitz II AV block     s/p PPM 12/2009  . Hx of colonoscopy   . Headache 2003   . GERD (gastroesophageal reflux disease)   . Alzheimer's disease 07/2011  . PAD (peripheral artery disease) (HCC)   . Atherosclerosis   . Osteoporosis   . Fall (on)(from) incline, initial encounter   . Atherosclerosis of artery   . Pressure ulcer   . UTI (lower urinary tract infection)   . Thrombocytopenia (HCC)   . Hypernatremia   . Generalized weakness   . Right arm pain   . Protein calorie malnutrition (HCC)   . Chronic constipation      CURRENT MEDICATIONS: Reviewed  Patient's Medications  New Prescriptions   No medications on file  Previous Medications   ACETAMINOPHEN (TYLENOL) 325 MG TABLET    Take 650 mg by mouth every 4 (four) hours as needed for mild pain (For pain or temperature greater than 99.5 orally).    ACETAMINOPHEN (TYLENOL) 325 MG TABLET    Take 650 mg by mouth daily at 12 noon. Give at noon   ASPIRIN EC 81 MG TABLET    Take 81 mg by mouth daily.   ATORVASTATIN (LIPITOR) 40 MG TABLET    Take 40 mg by mouth daily.    CALCIUM-VITAMIN D (OSCAL WITH D) 500-200 MG-UNIT TABLET  Take 1 tablet by mouth 2 (two) times daily.   DICLOFENAC SODIUM (VOLTAREN) 1 % GEL    Apply 2 g topically 4 (four) times daily as needed (pain).   DONEPEZIL (ARICEPT ODT) 10 MG DISINTEGRATING TABLET    Take 10 mg by mouth at bedtime.   FERROUS SULFATE 325 (65 FE) MG TABLET    Take 325 mg by mouth 2 (two) times daily.   FLUCONAZOLE (DIFLUCAN) 100 MG TABLET    Take 100 mg by mouth daily. Take for 3 days   HYDROCODONE-ACETAMINOPHEN (NORCO/VICODIN) 5-325 MG TABLET    Take 1 tablet by mouth daily as needed for moderate pain (Give prior to dressing change per wound care nurse).   HYDROCODONE-ACETAMINOPHEN (NORCO/VICODIN) 5-325 MG TABLET    Take 1 tablet by mouth 2 (two) times daily. Give at 6AM and 6PM   LAVENDER OIL OIL    S/O- INSOMINIA /AGITATION/ RESTLESSNESS/ ANXIETY- Apply a drop of lavendar oil or [place] Oil behind each ear per standing order. Repeat PRN. Discontinue use if skin irritation  occurs.   LEVOFLOXACIN (LEVAQUIN) 500 MG TABLET    Take 500 mg by mouth daily. Take for 5 days   MIRTAZAPINE (REMERON) 15 MG TABLET    Take 15 mg by mouth at bedtime.   MULTIPLE VITAMINS-MINERALS (DECUBI-VITE) CAPS    Take 1 capsule by mouth daily.   NUTRITIONAL SUPPLEMENTS (NUTRITIONAL SHAKE PO)    Take by mouth 2 (two) times daily. Start Mighty Shake BID with MedPass (2PM, 8PM)   POLYETHYLENE GLYCOL (MIRALAX / GLYCOLAX) PACKET    Take 17 g by mouth 2 (two) times daily.   PROTEIN (PROCEL 100) POWD    Take 2 scoop by mouth 2 (two) times daily.   SACCHAROMYCES BOULARDII (FLORASTOR) 250 MG CAPSULE    Take 250 mg by mouth 2 (two) times daily. Take for one week   SENNA-DOCUSATE (SENNA S) 8.6-50 MG TABLET    Take 2 tablets by mouth 2 (two) times daily.   UNABLE TO FIND    Med Name: MedPass 2.0 90 mL TID for nutritional support  Modified Medications   No medications on file  Discontinued Medications   No medications on file     Allergies  Allergen Reactions  . Latex Rash     REVIEW OF SYSTEMS:  Unabel to obtain due to advanced alzheimer's dementia   PHYSICAL EXAMINATION  GENERAL APPEARANCE:  In no acute distress.  SKIN:  Has sacral pressure ulcer, stage 4, with wound vac HEAD: Normal in size and contour. No evidence of trauma EYES: Lids open and close normally. No blepharitis, entropion or ectropion. PERRL. Conjunctivae are clear and sclerae are white. Lenses are without opacity EARS: Pinnae are normal. Patient hears normal voice tunes of the examiner MOUTH and THROAT: Lips are without lesions. Oral mucosa is moist and without lesions. Tongue is normal in shape, size, and color and without lesions NECK: supple, trachea midline, no neck masses, no thyroid tenderness, no thyromegaly LYMPHATICS: no LAN in the neck, no supraclavicular LAN RESPIRATORY: breathing is even & unlabored, BS CTAB CARDIAC: RRR, + murmur,no extra heart sounds, no edema GI: abdomen soft, normal BS, no masses, no  tenderness, no hepatomegaly, no splenomegaly GU:  Has FC intact draining to urine bag EXTREMITIES:  Able to move BUE and has generalized weakness on BLE PSYCHIATRIC: Alert and oriented to self. Affect and behavior are appropriate  LABS/RADIOLOGY: Labs reviewed: Basic Metabolic Panel:  Recent Labs  11/91/4709/23/16 0428  10/18/15 0420 10/19/15  4098 10/20/15 0648  11/20/15 11/29/15 12/10/15  NA 144  < > 150* 141 142  < > 142 141 146  K 3.0*  < > 3.3* 4.0 3.9  < > 4.2 4.5 4.2  CL 108  < > 120* 113* 109  --   --   --   --   CO2 28  < > --   --   --   --   GLUCOSE 138*  < > 122* 182* 124*  --   --   --   --   BUN 39*  < > 22* 17 13  < > CREATININE 1.34*  < > 0.74 0.77 0.72  < > 0.7 0.8 0.8  CALCIUM 9.7  < > 9.6 9.2 9.7  --   --   --   --   MG 1.8  --   --   --   --   --   --   --   --   < > = values in this interval not displayed. Liver Function Tests:  Recent Labs  06/07/15 2252 08/14/15 10/14/15 1529 10/15/15 0649  AST 30 25 65* 51*  ALT 19 26 59* 48  ALKPHOS 77 123 89 72  BILITOT 1.1  --  0.8 0.7  PROT 7.1  --  7.2 6.3*  ALBUMIN 2.9*  --  2.7* 2.3*   CBC:  Recent Labs  10/18/15 0420 10/19/15 0747 10/20/15 0648 10/23/15 11/06/15 11/20/15 12/10/15  WBC 7.0 6.9 6.3 6.4 9.0 8.4 10.6  NEUTROABS  --   --   --  --   HGB 10.2* 9.8* 10.9* 10.2* 9.6* 9.3* 8.1*  HCT 34.9* 32.2* 35.1* 33* 32* 32* 30*  MCV 82.5 79.7 78.5  --   --   --   --   PLT 117* 128* 144* 222 361 391 475*    Cardiac Enzymes:  Recent Labs  05/17/15 1306  TROPONINI 0.08*   CBG:  Recent Labs  10/19/15 0749 10/20/15 0731 10/21/15 0721  GLUCAP 166* 112* 126*      ASSESSMENT/PLAN:  Diabetes mellitus, type II - hemoglobin A1c 6.0; on no medication  Hypertension - stable; on no medication  Severe aortic stenosis - TTE on 05/23/15 demonstrates severe AS; continue aspirin 81 mg daily  Dementia - continue Aricept 10 mg 1 tab by mouth daily at HS  Hyperlipidemia -  continue Lipitor 40 mg 1 tab by mouth daily  Sacral pressure ulcer stage 4 - continue decubivite 1 tab daily; continue wound treatment and air mattress, wound vac; follow-up @ Wound Clinic  Depression - mood is stable; continue Remeron 15 mg 1 tab by mouth daily at bedtime  Osteoarthrosis - continue Voltaren gel 1% apply 2 g to BLE 4 times a day when necessary and acetaminophen 325 mg take 2 tabs = 650 mg by mouth every 4 hours when necessary and Tylenol 325 mg take 2 tabs by mouth daily at noon,  and Norco 5/325 mg 1 tab by mouth daily when necessary whenever wound care is being done  Protein calorie malnutrition, severe - continue Procel 2 scoops by mouth twice a day  Hypernatremia - continue D5 1/2 NS @ 100 cc/hr X 1L  UTI - continue Diflucan for a total of 3 days and Levaquin for a total of 5 days  Constipation - continue senna S 2 tabs by mouth twice a day, MiraLAX 17 g by  mouth twice a day  Microcytic anemia - hemoglobin 8.1; continue ferrous sulfate 325 mg 1 tab by mouth twice a day      Goals of care:  Long-term care   [facility], NP [facility] [phone]

## 2015-12-13 ENCOUNTER — Other Ambulatory Visit: Payer: Self-pay | Admitting: Internal Medicine

## 2015-12-13 ENCOUNTER — Ambulatory Visit (HOSPITAL_COMMUNITY)
Admission: RE | Admit: 2015-12-13 | Discharge: 2015-12-13 | Disposition: A | Discharge status: Home / Self Care | Payer: Medicare Other | Source: Ambulatory Visit | Attending: Internal Medicine | Admitting: Internal Medicine

## 2015-12-13 DIAGNOSIS — B999 Unspecified infectious disease: Secondary | ICD-10-CM

## 2015-12-13 LAB — BASIC METABOLIC PANEL
BUN: 13 mg/dL (ref 4–21)
Creatinine: 0.6 mg/dL (ref 0.5–1.1)
GLUCOSE: 88 mg/dL
POTASSIUM: 4.3 mmol/L (ref 3.4–5.3)
Sodium: 145 mmol/L (ref 137–147)

## 2015-12-13 LAB — HEPATIC FUNCTION PANEL
ALT: 11 U/L (ref 7–35)
AST: 20 U/L (ref 13–35)
Alkaline Phosphatase: 84 U/L (ref 25–125)
BILIRUBIN, TOTAL: 0.7 mg/dL

## 2015-12-13 LAB — CBC AND DIFFERENTIAL
HEMATOCRIT: 28 % — AB (ref 36–46)
Hemoglobin: 7.6 g/dL — AB (ref 12.0–16.0)
NEUTROS ABS: 7 /uL
Platelets: 472 10*3/uL — AB (ref 150–399)
WBC: 11 10*3/mL

## 2015-12-13 MED ORDER — LIDOCAINE HCL 1 % IJ SOLN
INTRAMUSCULAR | Status: AC
  Filled 2015-12-13: qty 20

## 2015-12-13 MED ORDER — HEPARIN SOD (PORK) LOCK FLUSH 100 UNIT/ML IV SOLN
INTRAVENOUS | Status: AC
  Filled 2015-12-13: qty 5

## 2015-12-13 NOTE — Procedures (Signed)
Successful RUE SL POWER PICC TIP SVC/RA NO COMP STABLE READY FOR USE  

## 2015-12-16 LAB — POCT ERYTHROCYTE SEDIMENTATION RATE, NON-AUTOMATED: Sed Rate: 106 mm

## 2015-12-19 ENCOUNTER — Emergency Department (HOSPITAL_COMMUNITY): Payer: Medicare Other

## 2015-12-19 ENCOUNTER — Encounter (HOSPITAL_COMMUNITY): Payer: Self-pay | Admitting: Emergency Medicine

## 2015-12-19 ENCOUNTER — Observation Stay (HOSPITAL_COMMUNITY)
Admission: EM | Admit: 2015-12-19 | Discharge: 2015-12-21 | Disposition: A | Discharge status: Skilled Nursing Facility | Payer: Medicare Other | Attending: Internal Medicine | Admitting: Internal Medicine

## 2015-12-19 DIAGNOSIS — N289 Disorder of kidney and ureter, unspecified: Secondary | ICD-10-CM

## 2015-12-19 DIAGNOSIS — I2510 Atherosclerotic heart disease of native coronary artery without angina pectoris: Secondary | ICD-10-CM | POA: Insufficient documentation

## 2015-12-19 DIAGNOSIS — L8994 Pressure ulcer of unspecified site, stage 4: Secondary | ICD-10-CM

## 2015-12-19 DIAGNOSIS — Z8673 Personal history of transient ischemic attack (TIA), and cerebral infarction without residual deficits: Secondary | ICD-10-CM | POA: Diagnosis not present

## 2015-12-19 DIAGNOSIS — R000 Tachycardia, unspecified: Secondary | ICD-10-CM | POA: Insufficient documentation

## 2015-12-19 DIAGNOSIS — F0280 Dementia in other diseases classified elsewhere without behavioral disturbance: Secondary | ICD-10-CM | POA: Insufficient documentation

## 2015-12-19 DIAGNOSIS — D649 Anemia, unspecified: Secondary | ICD-10-CM | POA: Diagnosis present

## 2015-12-19 DIAGNOSIS — Z87891 Personal history of nicotine dependence: Secondary | ICD-10-CM | POA: Diagnosis not present

## 2015-12-19 DIAGNOSIS — I350 Nonrheumatic aortic (valve) stenosis: Secondary | ICD-10-CM | POA: Diagnosis not present

## 2015-12-19 DIAGNOSIS — B9620 Unspecified Escherichia coli [E. coli] as the cause of diseases classified elsewhere: Secondary | ICD-10-CM | POA: Diagnosis not present

## 2015-12-19 DIAGNOSIS — Z950 Presence of cardiac pacemaker: Secondary | ICD-10-CM | POA: Diagnosis not present

## 2015-12-19 DIAGNOSIS — Z7982 Long term (current) use of aspirin: Secondary | ICD-10-CM | POA: Diagnosis not present

## 2015-12-19 DIAGNOSIS — Z96651 Presence of right artificial knee joint: Secondary | ICD-10-CM | POA: Insufficient documentation

## 2015-12-19 DIAGNOSIS — Z79899 Other long term (current) drug therapy: Secondary | ICD-10-CM | POA: Insufficient documentation

## 2015-12-19 DIAGNOSIS — M810 Age-related osteoporosis without current pathological fracture: Secondary | ICD-10-CM | POA: Insufficient documentation

## 2015-12-19 DIAGNOSIS — G309 Alzheimer's disease, unspecified: Secondary | ICD-10-CM | POA: Insufficient documentation

## 2015-12-19 DIAGNOSIS — E1122 Type 2 diabetes mellitus with diabetic chronic kidney disease: Secondary | ICD-10-CM | POA: Insufficient documentation

## 2015-12-19 DIAGNOSIS — N390 Urinary tract infection, site not specified: Secondary | ICD-10-CM

## 2015-12-19 DIAGNOSIS — I10 Essential (primary) hypertension: Secondary | ICD-10-CM | POA: Diagnosis present

## 2015-12-19 DIAGNOSIS — L8990 Pressure ulcer of unspecified site, unspecified stage: Secondary | ICD-10-CM | POA: Diagnosis present

## 2015-12-19 DIAGNOSIS — I252 Old myocardial infarction: Secondary | ICD-10-CM | POA: Insufficient documentation

## 2015-12-19 DIAGNOSIS — E1151 Type 2 diabetes mellitus with diabetic peripheral angiopathy without gangrene: Secondary | ICD-10-CM | POA: Diagnosis not present

## 2015-12-19 DIAGNOSIS — N189 Chronic kidney disease, unspecified: Secondary | ICD-10-CM | POA: Diagnosis not present

## 2015-12-19 DIAGNOSIS — M869 Osteomyelitis, unspecified: Secondary | ICD-10-CM | POA: Diagnosis not present

## 2015-12-19 DIAGNOSIS — E119 Type 2 diabetes mellitus without complications: Secondary | ICD-10-CM

## 2015-12-19 DIAGNOSIS — E785 Hyperlipidemia, unspecified: Secondary | ICD-10-CM | POA: Insufficient documentation

## 2015-12-19 DIAGNOSIS — I129 Hypertensive chronic kidney disease with stage 1 through stage 4 chronic kidney disease, or unspecified chronic kidney disease: Secondary | ICD-10-CM | POA: Diagnosis not present

## 2015-12-19 LAB — URINALYSIS, ROUTINE W REFLEX MICROSCOPIC
Glucose, UA: NEGATIVE mg/dL
Ketones, ur: NEGATIVE mg/dL
Nitrite: NEGATIVE
PROTEIN: 30 mg/dL — AB
Specific Gravity, Urine: 1.026 (ref 1.005–1.030)
pH: 5.5 (ref 5.0–8.0)

## 2015-12-19 LAB — COMPREHENSIVE METABOLIC PANEL
ALBUMIN: 1.9 g/dL — AB (ref 3.5–5.0)
ALK PHOS: 86 U/L (ref 38–126)
ALT: 12 U/L — ABNORMAL LOW (ref 14–54)
AST: 24 U/L (ref 15–41)
Anion gap: 10 (ref 5–15)
BILIRUBIN TOTAL: 0.5 mg/dL (ref 0.3–1.2)
BUN: 13 mg/dL (ref 6–20)
CALCIUM: 9.6 mg/dL (ref 8.9–10.3)
CO2: 26 mmol/L (ref 22–32)
CREATININE: 0.79 mg/dL (ref 0.44–1.00)
Chloride: 107 mmol/L (ref 101–111)
GFR calc Af Amer: >60 mL/min (ref >60)
GLUCOSE: 154 mg/dL — AB (ref 65–99)
Potassium: 3.4 mmol/L — ABNORMAL LOW (ref 3.5–5.1)
Sodium: 143 mmol/L (ref 135–145)
TOTAL PROTEIN: 7 g/dL (ref 6.5–8.1)

## 2015-12-19 LAB — CBC WITH DIFFERENTIAL/PLATELET
Basophils Absolute: 0 10*3/uL (ref 0.0–0.1)
Basophils Relative: 0 %
EOS PCT: 2 %
Eosinophils Absolute: 0.2 10*3/uL (ref 0.0–0.7)
HCT: 26.1 % — ABNORMAL LOW (ref 36.0–46.0)
HEMOGLOBIN: 7.5 g/dL — AB (ref 12.0–15.0)
LYMPHS PCT: 24 %
Lymphs Abs: 2.7 10*3/uL (ref 0.7–4.0)
MCH: 20.3 pg — ABNORMAL LOW (ref 26.0–34.0)
MCHC: 28.7 g/dL — ABNORMAL LOW (ref 30.0–36.0)
MCV: 70.7 fL — AB (ref 78.0–100.0)
Monocytes Absolute: 0.6 10*3/uL (ref 0.1–1.0)
Monocytes Relative: 5 %
NEUTROS PCT: 69 %
Neutro Abs: 7.6 10*3/uL (ref 1.7–7.7)
PLATELETS: 430 10*3/uL — AB (ref 150–400)
RBC: 3.69 MIL/uL — AB (ref 3.87–5.11)
RDW: 20.2 % — ABNORMAL HIGH (ref 11.5–15.5)
WBC: 11.1 10*3/uL — AB (ref 4.0–10.5)

## 2015-12-19 LAB — URINE MICROSCOPIC-ADD ON

## 2015-12-19 LAB — MAGNESIUM: Magnesium: 1.9 mg/dL (ref 1.7–2.4)

## 2015-12-19 LAB — I-STAT CG4 LACTIC ACID, ED: LACTIC ACID, VENOUS: 2.16 mmol/L — AB (ref 0.5–2.0)

## 2015-12-19 LAB — PHOSPHORUS: Phosphorus: 2.2 mg/dL — ABNORMAL LOW (ref 2.5–4.6)

## 2015-12-19 LAB — RETICULOCYTES
RBC.: 3.69 MIL/uL — ABNORMAL LOW (ref 3.87–5.11)
RETIC COUNT ABSOLUTE: 92.3 10*3/uL (ref 19.0–186.0)
Retic Ct Pct: 2.5 % (ref 0.4–3.1)

## 2015-12-19 LAB — FOLATE: FOLATE: 12 ng/mL (ref >5.9)

## 2015-12-19 LAB — PREPARE RBC (CROSSMATCH)

## 2015-12-19 LAB — PROTIME-INR
INR: 1.4 (ref 0.00–1.49)
Prothrombin Time: 16.7 seconds — ABNORMAL HIGH (ref 11.6–15.2)

## 2015-12-19 LAB — MRSA PCR SCREENING: MRSA BY PCR: POSITIVE — AB

## 2015-12-19 LAB — POC OCCULT BLOOD, ED: FECAL OCCULT BLD: NEGATIVE

## 2015-12-19 MED ORDER — FERROUS SULFATE 325 (65 FE) MG PO TABS
325.0000 mg | ORAL_TABLET | Freq: Two times a day (BID) | ORAL | Status: DC
  Administered 2015-12-19 – 2015-12-20 (×2): 325 mg via ORAL
  Filled 2015-12-19 (×4): qty 1

## 2015-12-19 MED ORDER — HYDROCODONE-ACETAMINOPHEN 5-325 MG PO TABS
1.0000 | ORAL_TABLET | Freq: Two times a day (BID) | ORAL | Status: DC
  Administered 2015-12-20: 1 via ORAL
  Filled 2015-12-19: qty 1

## 2015-12-19 MED ORDER — DONEPEZIL HCL 10 MG PO TBDP: 10.0000 mg | ORAL_TABLET | Freq: Every day | ORAL | Status: DC

## 2015-12-19 MED ORDER — SODIUM CHLORIDE 0.9% FLUSH
3.0000 mL | Freq: Two times a day (BID) | INTRAVENOUS | Status: DC
  Administered 2015-12-19 – 2015-12-20 (×2): 3 mL via INTRAVENOUS

## 2015-12-19 MED ORDER — FLUCONAZOLE 100 MG PO TABS: 100.0000 mg | ORAL_TABLET | Freq: Every day | ORAL | Status: DC

## 2015-12-19 MED ORDER — SODIUM CHLORIDE 0.9 % IV SOLN
Freq: Once | INTRAVENOUS | Status: AC
  Administered 2015-12-19: 23:00:00 via INTRAVENOUS

## 2015-12-19 MED ORDER — ASPIRIN EC 81 MG PO TBEC
81.0000 mg | DELAYED_RELEASE_TABLET | Freq: Every day | ORAL | Status: DC
  Administered 2015-12-20: 81 mg via ORAL
  Filled 2015-12-19 (×2): qty 1

## 2015-12-19 MED ORDER — ATORVASTATIN CALCIUM 40 MG PO TABS
40.0000 mg | ORAL_TABLET | Freq: Every day | ORAL | Status: DC
  Administered 2015-12-19 – 2015-12-20 (×2): 40 mg via ORAL
  Filled 2015-12-19 (×3): qty 1

## 2015-12-19 MED ORDER — SENNOSIDES-DOCUSATE SODIUM 8.6-50 MG PO TABS
2.0000 | ORAL_TABLET | Freq: Two times a day (BID) | ORAL | Status: DC
  Administered 2015-12-19 – 2015-12-20 (×2): 2 via ORAL
  Filled 2015-12-19 (×2): qty 2

## 2015-12-19 MED ORDER — VANCOMYCIN HCL IN DEXTROSE 750-5 MG/150ML-% IV SOLN
750.0000 mg | Freq: Two times a day (BID) | INTRAVENOUS | Status: DC
  Administered 2015-12-20 (×2): 750 mg via INTRAVENOUS
  Filled 2015-12-19 (×3): qty 150

## 2015-12-19 MED ORDER — MAGNESIUM SULFATE 2 GM/50ML IV SOLN
2.0000 g | Freq: Once | INTRAVENOUS | Status: AC
  Administered 2015-12-19: 2 g via INTRAVENOUS
  Filled 2015-12-19: qty 50

## 2015-12-19 MED ORDER — DECUBI-VITE PO CAPS: 1.0000 | ORAL_CAPSULE | Freq: Every day | ORAL | Status: DC

## 2015-12-19 MED ORDER — PROCEL 100 PO POWD: 2.0000 | Freq: Two times a day (BID) | ORAL | Status: DC

## 2015-12-19 MED ORDER — MIRTAZAPINE 15 MG PO TABS
15.0000 mg | ORAL_TABLET | Freq: Every day | ORAL | Status: DC
  Administered 2015-12-19: 15 mg via ORAL
  Filled 2015-12-19 (×2): qty 1

## 2015-12-19 MED ORDER — ADULT MULTIVITAMIN W/MINERALS CH
1.0000 | ORAL_TABLET | Freq: Every day | ORAL | Status: DC
  Administered 2015-12-20: 1 via ORAL
  Filled 2015-12-19 (×2): qty 1

## 2015-12-19 MED ORDER — CALCIUM CARBONATE-VITAMIN D 500-200 MG-UNIT PO TABS
1.0000 | ORAL_TABLET | Freq: Two times a day (BID) | ORAL | Status: DC
  Administered 2015-12-19 – 2015-12-20 (×2): 1 via ORAL
  Filled 2015-12-19 (×4): qty 1

## 2015-12-19 MED ORDER — BENEPROTEIN PO POWD
2.0000 | Freq: Two times a day (BID) | ORAL | Status: DC
  Administered 2015-12-19 – 2015-12-20 (×2): 12 g via ORAL
  Filled 2015-12-19: qty 227

## 2015-12-19 MED ORDER — POLYETHYLENE GLYCOL 3350 17 G PO PACK
17.0000 g | PACK | Freq: Two times a day (BID) | ORAL | Status: DC
  Administered 2015-12-19 – 2015-12-20 (×2): 17 g via ORAL
  Filled 2015-12-19 (×4): qty 1

## 2015-12-19 MED ORDER — LEVOFLOXACIN 500 MG PO TABS: 500.0000 mg | ORAL_TABLET | Freq: Every day | ORAL | Status: DC

## 2015-12-19 MED ORDER — POTASSIUM CHLORIDE 20 MEQ PO PACK: 20.0000 meq | PACK | Freq: Once | ORAL | Status: DC

## 2015-12-19 MED ORDER — ACETAMINOPHEN 325 MG PO TABS: 650.0000 mg | ORAL_TABLET | ORAL | Status: DC | PRN

## 2015-12-19 MED ORDER — DEXTROSE 5 % IV SOLN
2.0000 g | Freq: Two times a day (BID) | INTRAVENOUS | Status: DC
  Administered 2015-12-20: 2 g via INTRAVENOUS
  Filled 2015-12-19 (×2): qty 2

## 2015-12-19 MED ORDER — SACCHAROMYCES BOULARDII 250 MG PO CAPS
250.0000 mg | ORAL_CAPSULE | Freq: Two times a day (BID) | ORAL | Status: AC
  Administered 2015-12-19: 250 mg via ORAL
  Filled 2015-12-19: qty 1

## 2015-12-19 MED ORDER — ACETAMINOPHEN 325 MG PO TABS
650.0000 mg | ORAL_TABLET | Freq: Every day | ORAL | Status: DC
  Filled 2015-12-19 (×2): qty 2

## 2015-12-19 MED ORDER — DONEPEZIL HCL 10 MG PO TABS
10.0000 mg | ORAL_TABLET | Freq: Every day | ORAL | Status: DC
  Administered 2015-12-19: 10 mg via ORAL
  Filled 2015-12-19 (×2): qty 1

## 2015-12-19 MED ORDER — POTASSIUM CHLORIDE CRYS ER 20 MEQ PO TBCR
20.0000 meq | EXTENDED_RELEASE_TABLET | Freq: Once | ORAL | Status: AC
Start: 2015-12-19 — End: 2015-12-19
  Administered 2015-12-19: 20 meq via ORAL
  Filled 2015-12-19: qty 1

## 2015-12-19 MED ORDER — SODIUM CHLORIDE 0.9 % IV BOLUS (SEPSIS)
500.0000 mL | Freq: Once | INTRAVENOUS | Status: AC
  Administered 2015-12-19: 500 mL via INTRAVENOUS

## 2015-12-19 MED ORDER — DEXTROSE 5 % IV SOLN
2.0000 g | Freq: Once | INTRAVENOUS | Status: AC
  Administered 2015-12-19: 2 g via INTRAVENOUS
  Filled 2015-12-19: qty 2

## 2015-12-19 NOTE — Progress Notes (Signed)
Pharmacy Antibiotic Note  [name] is a 82 y.o. female admitted on 12/19/2015 with wound infection.  Pharmacy has been consulted for Cefepime and Vancomycin dosing.  Plan: Vancomycin 750 mg IV q12h (VT=15-20 mg/L) Cefepime 2Gm IV q12h  Height: 5\' 7"  (170.2 cm) Weight: 198 lb 1.6 oz (89.858 kg) IBW/kg (Calculated) : 61.6  Temp (24hrs), Avg:98.5 F (36.9 C), Min:97.9 F (36.6 C), Max:99.3 F (37.4 C)   Recent Labs Lab 12/19/15 1644 12/19/15 1657  WBC 11.1*  --   CREATININE 0.79  --   LATICACIDVEN  --  2.16*    Estimated Creatinine Clearance: 60.2 mL/min (by C-G formula based on Cr of 0.79).    Allergies  Allergen Reactions  . Latex Rash    Antimicrobials this admission: 4/20 cefepime >>  4/21 vancomycin >>   Dose adjustments this admission:   Microbiology results:  BCx:   UCx:    Sputum:    MRSA PCR:   Thank you for allowing pharmacy to be a part of this patient's care.  Lorenza EvangelistGreen, [name] 12/19/2015 10:49 PM

## 2015-12-19 NOTE — ED Notes (Signed)
Pt keeps pulling ekg leads off of her.

## 2015-12-19 NOTE — ED Notes (Signed)
Patient with 10 beat run of V-Tach. Dr Robb Matarrtiz notified.

## 2015-12-19 NOTE — ED Notes (Signed)
[facility] with thick, yellow drainage in tubing.

## 2015-12-19 NOTE — H&P (Signed)
History and Physical    [name]:[id number] DOB: 1930-02-15 DOA: 12/19/2015  Referring MD/NP/PA: Benjiman Core, M.D. PCP: Oneal Grout, MD   Outpatient Specialists:  Patient coming from: [facility] nursing home.  Chief Complaint: Anemia.  HPI: [name] is a 82 y.o. female with below past medical history who was brought from her nursing home due to symptomatic anemia. CBC done earlier showed a hemoglobin level of 6.6 g/dL. Per the daughter, and she was getting IV antibiotics and IV fluids through a PICC line.   ED Course:  Repeat hemoglobin level was 7.5 g/dL. While in the ER, the patient had a transient 10 beat run of V. Tach. Electrolytes were corrected. No further V. tach runs have been reported by the nursing staff.   Review of Systems:  Unable to review due to the patient's dementia.  Past Medical History  Diagnosis Date  . DM (diabetes mellitus) (HCC)   . Hyperlipidemia   . HTN (hypertension)   . CAD (coronary artery disease)     s/p NSTEMI 2003 with stent placement OM1.  PTCA with cutting balloon prox LAD. 2005 admission with chest pain, BMS placed to CFX and DES placed in RCA  . Aortic stenosis     moderate to severe  . Anemia of chronic disease   . CRI (chronic renal insufficiency)   . CVA (cerebral infarction)     remote; recovered  . Second degree Mobitz II AV block     s/p PPM 12/2009  . Hx of colonoscopy   . Headache 2003  . GERD (gastroesophageal reflux disease)   . Alzheimer's disease 07/2011  . PAD (peripheral artery disease) (HCC)   . Atherosclerosis   . Osteoporosis   . Fall (on)(from) incline, initial encounter   . Atherosclerosis of artery   . Pressure ulcer   . UTI (lower urinary tract infection)   . Thrombocytopenia (HCC)   . Hypernatremia   . Generalized weakness   . Right arm pain   . Protein calorie malnutrition (HCC)   . Chronic constipation     Past Surgical History  Procedure Laterality Date  . Hysterectomy     . Benign vocal cord tumor removed    . Right knee surgery      x 3 with TKA  . Pacemaker insertion  01/10/2010    SJM Accent DR RF implanted by Dr Johney Frame     reports that she quit smoking about 25 years ago. She has never used smokeless tobacco. She reports that she does not drink alcohol or use illicit drugs.  Allergies  Allergen Reactions  . Latex Rash    Family History  Problem Relation Age of Onset  . Coronary artery disease    . Hypertension    . Diabetes Mother   . Diabetes Father   . Heart disease Mother   . Colon cancer Neg Hx   . Stomach cancer Sister    family history reviewed.   Prior to Admission medications   Medication Sig Start Date End Date Taking? Authorizing Provider  acetaminophen (TYLENOL) 325 MG tablet Take 650 mg by mouth daily at 12 noon. Give at noon   Yes Historical Provider, MD  acetaminophen (TYLENOL) 650 MG suppository Place 650 mg rectally every 4 (four) hours as needed.   Yes Historical Provider, MD  aspirin EC 81 MG tablet Take 81 mg by mouth daily.   Yes Historical Provider, MD  atorvastatin (LIPITOR) 40 MG tablet Take 40 mg  by mouth daily.    Yes Historical Provider, MD  calcium-vitamin D (OSCAL WITH D) 500-200 MG-UNIT tablet Take 1 tablet by mouth 2 (two) times daily.   Yes Historical Provider, MD  diclofenac sodium (VOLTAREN) 1 % GEL Apply 2 g topically 4 (four) times daily as needed (pain). Reported on 12/19/2015   Yes Historical Provider, MD  donepezil (ARICEPT ODT) 10 MG disintegrating tablet Take 10 mg by mouth at bedtime.   Yes Historical Provider, MD  ferrous sulfate 325 (65 FE) MG tablet Take 325 mg by mouth 2 (two) times daily.   Yes Historical Provider, MD  HYDROcodone-acetaminophen (NORCO/VICODIN) 5-325 MG tablet Take 1 tablet by mouth 2 (two) times daily. Give at 6AM and 6PM   Yes Historical Provider, MD  mirtazapine (REMERON) 15 MG tablet Take 15 mg by mouth at bedtime.   Yes Historical Provider, MD  Multiple Vitamins-Minerals  (DECUBI-VITE) CAPS Take 1 capsule by mouth daily.   Yes Historical Provider, MD  Nutritional Supplements (NUTRITIONAL SHAKE PO) Take by mouth 2 (two) times daily. Start Mighty Shake BID with MedPass (2PM, 8PM)   Yes Historical Provider, MD  polyethylene glycol (MIRALAX / GLYCOLAX) packet Take 17 g by mouth 2 (two) times daily.   Yes Historical Provider, MD  Protein (PROCEL 100) POWD Take 2 scoop by mouth 2 (two) times daily.   Yes Historical Provider, MD  saccharomyces boulardii (FLORASTOR) 250 MG capsule Take 250 mg by mouth 2 (two) times daily. Take for one week   Yes Historical Provider, MD  senna-docusate (SENNA S) 8.6-50 MG tablet Take 2 tablets by mouth 2 (two) times daily.   Yes Historical Provider, MD  acetaminophen (TYLENOL) 325 MG tablet Take 650 mg by mouth every 4 (four) hours as needed for mild pain or moderate pain (For pain or temperature greater than 99.5 orally).     Historical Provider, MD  HYDROcodone-acetaminophen (NORCO/VICODIN) 5-325 MG tablet Take 1 tablet by mouth daily as needed for moderate pain (Give prior to dressing change per wound care nurse).    Historical Provider, MD  Lavender Oil OIL S/O- INSOMINIA /AGITATION/ RESTLESSNESS/ ANXIETY- Apply a drop of lavendar oil or [place] Oil behind each ear per standing order. Repeat PRN. Discontinue use if skin irritation occurs.    Historical Provider, MD    Physical Exam: Filed Vitals:   12/19/15 1626 12/19/15 1731 12/19/15 1929  BP: 126/54 116/73 126/74  Pulse: 105 95 84  Temp: 98.4 F (36.9 C)  97.9 F (36.6 C)  TempSrc: Oral  Axillary  Resp: 18 18 15   Height: 5\' 7"  (1.702 m)    Weight: 92.987 kg (205 lb)    SpO2: 98% 95% 100%      Constitutional: NAD, calm, comfortable Filed Vitals:   12/19/15 1626 12/19/15 1731 12/19/15 1929  BP: 126/54 116/73 126/74  Pulse: 105 95 84  Temp: 98.4 F (36.9 C)  97.9 F (36.6 C)  TempSrc: Oral  Axillary  Resp: 18 18 15   Height: 5\' 7"  (1.702 m)    Weight: 92.987 kg (205  lb)    SpO2: 98% 95% 100%   Eyes: PERRL, lids and conjunctivae normal ENMT: Mucous membranes are dry. Posterior pharynx clear of any exudate or lesions.Normal dentition.  Neck: normal, supple, no masses, no thyromegaly Respiratory: clear to auscultation bilaterally, no wheezing, no crackles. Normal respiratory effort. No accessory muscle use.  Cardiovascular: Regular rate and rhythm, + 6/6 ejection systolic murmur / rubs / gallops. No extremity edema. 2+ pedal pulses. No  carotid bruits.  Abdomen: no tenderness, no masses palpated. No hepatosplenomegaly. Bowel sounds positive.  Musculoskeletal: no clubbing / cyanosis. No joint deformity upper and lower extremities. Good ROM, no contractures. Normal muscle tone.  Skin: Extensive stage IV sacral decubiti ulcer with packing Neurologic: Unable to fully evaluate due to dementia. Psychiatric: History of dementia.   Labs on Admission: I have personally reviewed following labs and imaging studies  CBC:  Recent Labs Lab 12/19/15 1644  WBC 11.1*  NEUTROABS 7.6  HGB 7.5*  HCT 26.1*  MCV 70.7*  PLT 430*   Basic Metabolic Panel:  Recent Labs Lab 12/19/15 1644  NA 143  K 3.4*  CL 107  CO2 26  GLUCOSE 154*  BUN 13  CREATININE 0.79  CALCIUM 9.6   GFR: Estimated Creatinine Clearance: 61.3 mL/min (by C-G formula based on Cr of 0.79). Liver Function Tests:  Recent Labs Lab 12/19/15 1644  AST 24  ALT 12*  ALKPHOS 86  BILITOT 0.5  PROT 7.0  ALBUMIN 1.9*   Coagulation Profile:  Recent Labs Lab 12/19/15 1644  INR 1.40   Anemia Panel:  Recent Labs  12/19/15 1644  RETICCTPCT 2.5   Urine analysis:    Component Value Date/Time   COLORURINE AMBER* 12/19/2015 1751   APPEARANCEUR CLOUDY* 12/19/2015 1751   LABSPEC 1.026 12/19/2015 1751   PHURINE 5.5 12/19/2015 1751   GLUCOSEU NEGATIVE 12/19/2015 1751   HGBUR SMALL* 12/19/2015 1751   BILIRUBINUR SMALL* 12/19/2015 1751   KETONESUR NEGATIVE 12/19/2015 1751   PROTEINUR  30* 12/19/2015 1751   UROBILINOGEN 1.0 06/07/2015 2357   NITRITE NEGATIVE 12/19/2015 1751   LEUKOCYTESUR LARGE* 12/19/2015 1751    Radiological Exams on Admission: Dg Chest 1 View  12/19/2015  CLINICAL DATA:  Altered mental status EXAM: CHEST 1 VIEW COMPARISON:  10/14/2015 FINDINGS: Right upper extremity PICC has been placed with its tip at the cavoatrial junction. The heart is upper normal in size. Dual lead left subclavian pacemaker device and leads are stable and intact. Lungs are under aerated and grossly clear. No pneumothorax. IMPRESSION: No active cardiopulmonary disease. Electronically Signed   By: Jolaine Click M.D.   On: 12/19/2015 16:41   Echocardiogram 05/23/2015  LV EF: 50% - 55%  ------------------------------------------------------------------- Indications: CHF - 428.0.  ------------------------------------------------------------------- History: PMH: GERD. Alzheimer&'s. Coronary artery disease. Aortic valve disease. Stroke. Risk factors: Hypertension. Diabetes mellitus. Dyslipidemia.  ------------------------------------------------------------------- Study Conclusions  - Left ventricle: The cavity size was normal. Wall thickness was  increased in a pattern of moderate LVH. Systolic function was  normal. The estimated ejection fraction was in the range of 50%  to 55%. Wall motion was normal; there were no regional wall  motion abnormalities. Doppler parameters are consistent with  abnormal left ventricular relaxation (grade 1 diastolic  dysfunction). - Ventricular septum: Septal motion showed moderate dyssynergy.  These changes are consistent with right ventricular pacing. - Aortic valve: Cusp separation was severely reduced. Valve  mobility was moderately restricted. There was severe stenosis.  Valve area (VTI): 0.45 cm^2. Valve area (Vmax): 0.63 cm^2. Valve  area (Vmean): 0.55 cm^2. - Mitral valve: Moderately calcified annulus. -  Tricuspid valve: There was mild-moderate regurgitation.  EKG: Independently reviewed. Vent. rate 120 BPM PR interval 130 ms QRS duration 95 ms QT/QTc 320/452 ms P-R-T axes -54 24 145 Sinus tachycardia with irregular rate Premature atrial complexes Anteroseptal infarct, old Nonspecific repol abnormality, diffuse leads  Assessment/Plan Principal Problem:   Symptomatic anemia Admit to telemetry/ulceration. Transfuse 2 units of packed RBCs. Follow-up  posttransfusion H&H in a.m.  Active Problems:   Type 2 diabetes mellitus (HCC) Carbohydrate modified diet. CBG monitoring before meals.    Essential hypertension, benign Stable. Monitor blood pressure.    Aortic stenosis Severe grade per last echo. Not a surgical candidate at this time.    CAD (coronary artery disease) Stable. Continue aspirin and atorvastatin.    Alzheimer's disease Continue Aricept 10 mg by mouth daily. Despite advanced dementia, her family wants her to remain full care. Supportive care. Long-term prognosis is poor.    Pressure ulcer Continue local care and IV antibiotics while in the Hospital. Continue oral or IV antibiotics through PICC line at the nursing home facility Patient to follow up at the wound clinic.    DVT prophylaxis: Lovenox SQ. Code Status: (Full code.) Family Communication:  The patient's daughter was present in the room. Blackmon,Denise Daughter [phone]  [phone]  Disposition Plan: (Transfuse and discharge tomorrow morning.) Consults called: (Not applied) Admission status:  (obs / tele)   Bobette Mo MD [facility] Pager [phone].  If 7PM-7AM, please contact night-coverage [email or web address] Password [facility]  12/19/2015, 8:18 PM

## 2015-12-19 NOTE — ED Notes (Signed)
Pt is from [facility].  Labwork shows Hgb 6.6.  Hx of anemia, CKD, dementia, DM.  BP 140/82 P 78 RR20  97% RA.  Sacral wound present, causing great pain.  Foley cath in place.  PICC to Right arm.

## 2015-12-19 NOTE — ED Provider Notes (Signed)
CSN: [id number]     Arrival date & time 12/19/15  1556 History   First MD Initiated Contact with Patient 12/19/15 1558     Chief Complaint  Patient presents with  . Anemia     Level V caveat due to dementia. HPI Patient presents from nursing home with worsening anemia. Reportedly had hemoglobin of 6.6. Patient cannot participate in the history. States she feels fine. Does have history of dementia. Has PICC line,But unsure why. Has been getting IV fluids for dehydration and hyponatremia. Also has been on antibiotics orally for her large decubitus ulcer and urinary tract infections.   Past Medical History  Diagnosis Date  . DM (diabetes mellitus) (HCC)   . Hyperlipidemia   . HTN (hypertension)   . CAD (coronary artery disease)     s/p NSTEMI 2003 with stent placement OM1.  PTCA with cutting balloon prox LAD. 2005 admission with chest pain, BMS placed to CFX and DES placed in RCA  . Aortic stenosis     moderate to severe  . Anemia of chronic disease   . CRI (chronic renal insufficiency)   . CVA (cerebral infarction)     remote; recovered  . Second degree Mobitz II AV block     s/p PPM 12/2009  . Hx of colonoscopy   . Headache 2003  . GERD (gastroesophageal reflux disease)   . Alzheimer's disease 07/2011  . PAD (peripheral artery disease) (HCC)   . Atherosclerosis   . Osteoporosis   . Fall (on)(from) incline, initial encounter   . Atherosclerosis of artery   . Pressure ulcer   . UTI (lower urinary tract infection)   . Thrombocytopenia (HCC)   . Hypernatremia   . Generalized weakness   . Right arm pain   . Protein calorie malnutrition (HCC)   . Chronic constipation    Past Surgical History  Procedure Laterality Date  . Hysterectomy    . Benign vocal cord tumor removed    . Right knee surgery      x 3 with TKA  . Pacemaker insertion  01/10/2010    SJM Accent DR RF implanted by Dr Johney FrameAllred   Family History  Problem Relation Age of Onset  . Coronary artery disease    .  Hypertension    . Diabetes Mother   . Diabetes Father   . Heart disease Mother   . Colon cancer Neg Hx   . Stomach cancer Sister    Social History  Substance Use Topics  . Smoking status: Former Smoker -- 0.50 packs/day for 30 years    Quit date: 08/31/1990  . Smokeless tobacco: Never Used  . Alcohol Use: No   OB History    No data available     Review of Systems  Unable to perform ROS: Dementia      Allergies  Latex  Home Medications   Prior to Admission medications   Medication Sig Start Date End Date Taking? Authorizing Provider  acetaminophen (TYLENOL) 325 MG tablet Take 650 mg by mouth daily at 12 noon. Give at noon   Yes Historical Provider, MD  aspirin EC 81 MG tablet Take 81 mg by mouth daily.   Yes Historical Provider, MD  atorvastatin (LIPITOR) 40 MG tablet Take 40 mg by mouth daily.    Yes Historical Provider, MD  calcium-vitamin D (OSCAL WITH D) 500-200 MG-UNIT tablet Take 1 tablet by mouth 2 (two) times daily.   Yes Historical Provider, MD  diclofenac sodium (VOLTAREN) 1 %  GEL Apply 2 g topically 4 (four) times daily as needed (pain). Reported on 12/19/2015   Yes Historical Provider, MD  donepezil (ARICEPT ODT) 10 MG disintegrating tablet Take 10 mg by mouth at bedtime.   Yes Historical Provider, MD  ferrous sulfate 325 (65 FE) MG tablet Take 325 mg by mouth 2 (two) times daily.   Yes Historical Provider, MD  mirtazapine (REMERON) 15 MG tablet Take 15 mg by mouth at bedtime.   Yes Historical Provider, MD  Multiple Vitamins-Minerals (DECUBI-VITE) CAPS Take 1 capsule by mouth daily.   Yes Historical Provider, MD  Nutritional Supplements (NUTRITIONAL SHAKE PO) Take by mouth 2 (two) times daily. Start Mighty Shake BID with MedPass (2PM, 8PM)   Yes Historical Provider, MD  polyethylene glycol (MIRALAX / GLYCOLAX) packet Take 17 g by mouth 2 (two) times daily.   Yes Historical Provider, MD  Protein (PROCEL 100) POWD Take 2 scoop by mouth 2 (two) times daily.   Yes  Historical Provider, MD  saccharomyces boulardii (FLORASTOR) 250 MG capsule Take 250 mg by mouth 2 (two) times daily. Take for one week   Yes Historical Provider, MD  senna-docusate (SENNA S) 8.6-50 MG tablet Take 2 tablets by mouth 2 (two) times daily.   Yes Historical Provider, MD  acetaminophen (TYLENOL) 325 MG tablet Take 650 mg by mouth every 4 (four) hours as needed for mild pain or moderate pain (For pain or temperature greater than 99.5 orally).     Historical Provider, MD  cefTRIAXone 1 g in dextrose 5 % 50 mL Inject 1 g into the vein daily. 12/20/15 01/24/16  Vassie Loll, MD  HYDROcodone-acetaminophen (NORCO/VICODIN) 5-325 MG tablet Take 1 tablet by mouth every 8 (eight) hours as needed for moderate pain. Give at 6AM and 6PM 12/20/15   Vassie Loll, MD  Lavender Oil OIL S/O- INSOMINIA /AGITATION/ RESTLESSNESS/ ANXIETY- Apply a drop of lavendar oil or [place] Oil behind each ear per standing order. Repeat PRN. Discontinue use if skin irritation occurs.    Historical Provider, MD   BP 122/88 mmHg  Pulse 102  Temp(Src) 97.8 F (36.6 C) (Axillary)  Resp 20  Ht  (1.702 m)  Wt 198 lb 1.6 oz (89.858 kg)  BMI 31.02 kg/m2  SpO2 100% Physical Exam  Constitutional: She appears well-developed.  HENT:  Head: Atraumatic.  Neck: Neck supple.  Cardiovascular:  Tachycardia with loud systolic murmur  Pulmonary/Chest: Effort normal. No respiratory distress.  Abdominal: There is no tenderness.  Musculoskeletal: She exhibits edema.  PIC line to right upper arm.  Neurological: She is alert.  Skin: Skin is warm.  Large deep stage IV sacral decubitus with packing.    ED Course  Procedures (including critical care time) Labs Review Labs Reviewed  MRSA PCR SCREENING - Abnormal; Notable for the following:    MRSA by PCR POSITIVE (*)    All other components within normal limits  COMPREHENSIVE METABOLIC PANEL - Abnormal; Notable for the following:    Potassium 3.4 (*)    Glucose, Bld  154 (*)    Albumin 1.9 (*)    ALT 12 (*)    All other components within normal limits  PROTIME-INR - Abnormal; Notable for the following:    Prothrombin Time 16.7 (*)    All other components within normal limits  CBC WITH DIFFERENTIAL/PLATELET - Abnormal; Notable for the following:    WBC 11.1 (*)    RBC 3.69 (*)    Hemoglobin 7.5 (*)    HCT 26.1 (*)  MCV 70.7 (*)    MCH 20.3 (*)    MCHC 28.7 (*)    RDW 20.2 (*)    Platelets 430 (*)    All other components within normal limits  IRON AND TIBC - Abnormal; Notable for the following:    Iron 15 (*)    All other components within normal limits  FERRITIN - Abnormal; Notable for the following:    Ferritin 1351 (*)    All other components within normal limits  RETICULOCYTES - Abnormal; Notable for the following:    RBC. 3.69 (*)    All other components within normal limits  URINALYSIS, ROUTINE W REFLEX MICROSCOPIC (NOT AT [facility]) - Abnormal; Notable for the following:    Color, Urine AMBER (*)    APPearance CLOUDY (*)    Hgb urine dipstick SMALL (*)    Bilirubin Urine SMALL (*)    Protein, ur 30 (*)    Leukocytes, UA LARGE (*)    All other components within normal limits  URINE MICROSCOPIC-ADD ON - Abnormal; Notable for the following:    Squamous Epithelial / LPF 6-30 (*)    Bacteria, UA MANY (*)    All other components within normal limits  PHOSPHORUS - Abnormal; Notable for the following:    Phosphorus 2.2 (*)    All other components within normal limits  CBC - Abnormal; Notable for the following:    Hemoglobin 10.1 (*)    HCT 33.5 (*)    MCV 74.6 (*)    MCH 22.5 (*)    RDW 19.6 (*)    All other components within normal limits  GLUCOSE, CAPILLARY - Abnormal; Notable for the following:    Glucose-Capillary 109 (*)    All other components within normal limits  I-STAT CG4 LACTIC ACID, ED - Abnormal; Notable for the following:    Lactic Acid, Venous 2.16 (*)    All other components within normal limits  URINE CULTURE   VITAMIN B12  FOLATE  MAGNESIUM  GLUCOSE, CAPILLARY  GLUCOSE, CAPILLARY  POC OCCULT BLOOD, ED  TYPE AND SCREEN  PREPARE RBC (CROSSMATCH)    Imaging Review Dg Chest 1 View  12/19/2015  CLINICAL DATA:  Altered mental status EXAM: CHEST 1 VIEW COMPARISON:  10/14/2015 FINDINGS: Right upper extremity PICC has been placed with its tip at the cavoatrial junction. The heart is upper normal in size. Dual lead left subclavian pacemaker device and leads are stable and intact. Lungs are under aerated and grossly clear. No pneumothorax. IMPRESSION: No active cardiopulmonary disease. Electronically Signed   By: Jolaine Click M.D.   On: 12/19/2015 16:41   I have personally reviewed and evaluated these images and lab results as part of my medical decision-making.   EKG Interpretation   Date/Time:  Thursday December 19 2015 16:18:17 EDT Ventricular Rate:  120 PR Interval:  130 QRS Duration: 95 QT Interval:  320 QTC Calculation: 452 R Axis:   24 Text Interpretation:  Sinus tachycardia with irregular rate Premature  atrial complexes Anteroseptal infarct, old Nonspecific repol abnormality,  diffuse leads Confirmed by Rubin Payor  MD, Heide Brossart [phone]) on 12/19/2015  4:37:06 PM      MDM   Final diagnoses:  Anemia, unspecified anemia type  Renal insufficiency  Complicated UTI (urinary tract infection)  Decubital ulcer, stage IV (HCC)    Patient with anemia and UTI. Sent in from nursing home. Hemoglobin has been drifting down. Guaiac negative. Admit to internal medicine. Has reportedly been on IV antibiotics also. Heart rate  is improved after some IV fluids.    Benjiman Core, MD 12/21/15 [phone]

## 2015-12-19 NOTE — ED Notes (Signed)
Dr Ortiz at bedside 

## 2015-12-19 NOTE — ED Notes (Signed)
EDP made aware of patient lactic result. 

## 2015-12-19 NOTE — ED Notes (Signed)
Bed: [id number] Expected date:  Expected time:  Means of arrival:  Comments: Blood transfusion

## 2015-12-20 DIAGNOSIS — G308 Other Alzheimer's disease: Secondary | ICD-10-CM

## 2015-12-20 DIAGNOSIS — I10 Essential (primary) hypertension: Secondary | ICD-10-CM

## 2015-12-20 DIAGNOSIS — D649 Anemia, unspecified: Secondary | ICD-10-CM | POA: Diagnosis not present

## 2015-12-20 DIAGNOSIS — L8990 Pressure ulcer of unspecified site, unspecified stage: Secondary | ICD-10-CM

## 2015-12-20 DIAGNOSIS — F0281 Dementia in other diseases classified elsewhere with behavioral disturbance: Secondary | ICD-10-CM

## 2015-12-20 DIAGNOSIS — I350 Nonrheumatic aortic (valve) stenosis: Secondary | ICD-10-CM

## 2015-12-20 LAB — CBC
HCT: 33.5 % — ABNORMAL LOW (ref 36.0–46.0)
Hemoglobin: 10.1 g/dL — ABNORMAL LOW (ref 12.0–15.0)
MCH: 22.5 pg — ABNORMAL LOW (ref 26.0–34.0)
MCHC: 30.1 g/dL (ref 30.0–36.0)
MCV: 74.6 fL — AB (ref 78.0–100.0)
PLATELETS: 348 10*3/uL (ref 150–400)
RBC: 4.49 MIL/uL (ref 3.87–5.11)
RDW: 19.6 % — AB (ref 11.5–15.5)
WBC: 9.8 10*3/uL (ref 4.0–10.5)

## 2015-12-20 LAB — GLUCOSE, CAPILLARY
GLUCOSE-CAPILLARY: 86 mg/dL (ref 65–99)
GLUCOSE-CAPILLARY: 109 mg/dL — AB (ref 65–99)
GLUCOSE-CAPILLARY: 80 mg/dL (ref 65–99)

## 2015-12-20 LAB — FERRITIN: Ferritin: 1351 ng/mL — ABNORMAL HIGH (ref 11–307)

## 2015-12-20 LAB — IRON AND TIBC: IRON: 15 ug/dL — AB (ref 28–170)

## 2015-12-20 LAB — VITAMIN B12: Vitamin B-12: 710 pg/mL (ref 180–914)

## 2015-12-20 MED ORDER — FLUCONAZOLE 100 MG PO TABS
100.0000 mg | ORAL_TABLET | Freq: Every day | ORAL | Status: DC
Start: 2015-12-20 — End: 2015-12-21
  Administered 2015-12-20: 100 mg via ORAL
  Filled 2015-12-20 (×2): qty 1

## 2015-12-20 MED ORDER — MAGNESIUM SULFATE 2 GM/50ML IV SOLN: 2.0000 g | Freq: Once | INTRAVENOUS | Status: DC

## 2015-12-20 MED ORDER — HEPARIN SOD (PORK) LOCK FLUSH 100 UNIT/ML IV SOLN: 250.0000 [IU] | INTRAVENOUS | Status: DC | PRN

## 2015-12-20 MED ORDER — HYDROCODONE-ACETAMINOPHEN 5-325 MG PO TABS: 1.0000 | ORAL_TABLET | Freq: Three times a day (TID) | ORAL | Status: DC | PRN

## 2015-12-20 MED ORDER — CETYLPYRIDINIUM CHLORIDE 0.05 % MT LIQD
7.0000 mL | Freq: Two times a day (BID) | OROMUCOSAL | Status: DC
  Administered 2015-12-20: 7 mL via OROMUCOSAL

## 2015-12-20 MED ORDER — PRO-STAT SUGAR FREE PO LIQD
30.0000 mL | Freq: Two times a day (BID) | ORAL | Status: DC
  Filled 2015-12-20 (×3): qty 30

## 2015-12-20 MED ORDER — ALTEPLASE 2 MG IJ SOLR
2.0000 mg | Freq: Once | INTRAMUSCULAR | Status: AC
  Administered 2015-12-20: 2 mg
  Filled 2015-12-20 (×2): qty 2

## 2015-12-20 MED ORDER — K PHOS MONO-SOD PHOS DI & MONO 155-852-130 MG PO TABS
500.0000 mg | ORAL_TABLET | Freq: Every day | ORAL | Status: AC
  Administered 2015-12-20: 500 mg via ORAL
  Filled 2015-12-20: qty 2

## 2015-12-20 MED ORDER — CHLORHEXIDINE GLUCONATE 0.12 % MT SOLN
15.0000 mL | Freq: Two times a day (BID) | OROMUCOSAL | Status: DC
  Administered 2015-12-20 (×2): 15 mL via OROMUCOSAL
  Filled 2015-12-20 (×4): qty 15

## 2015-12-20 MED ORDER — SODIUM CHLORIDE 0.9% FLUSH: 10.0000 mL | INTRAVENOUS | Status: DC | PRN

## 2015-12-20 MED ORDER — DEXTROSE 5 % IV SOLN: 1.0000 g | INTRAVENOUS | Status: DC

## 2015-12-20 MED ORDER — ENSURE ENLIVE PO LIQD
237.0000 mL | Freq: Two times a day (BID) | ORAL | Status: DC
  Administered 2015-12-20: 237 mL via ORAL

## 2015-12-20 MED ORDER — CEFTRIAXONE SODIUM 1 G IJ SOLR: 1.0000 g | INTRAMUSCULAR | Status: DC

## 2015-12-20 NOTE — Progress Notes (Signed)
Patient is set to [facility] today. Patient & daughter, Angelique BlonderDenise aware. Discharge packet given to RN, Corrie DandyMary. [facility] called for transport.     Lincoln MaxinKelly Sharetta Ricchio, LCSW [facility] Clinical Social Worker cell #: [phone]

## 2015-12-20 NOTE — Care Management Note (Signed)
Case Management Note  Patient Details  Name: [name] MRN: [id number] Date of Birth: [birth year]  Subjective/Objective: 82 y/o f admitted w/anemia. From [facility].Paraplegic.No PT cons needed.  CSW following.                 Action/Plan:d/c plan return SNF.   Expected Discharge Date:   (unknown)               Expected Discharge Plan:  Skilled Nursing Facility  In-House Referral:  Clinical Social Work  Discharge planning Services  CM Consult  Post Acute Care Choice:    Choice offered to:     DME Arranged:    DME Agency:     HH Arranged:    HH Agency:     Status of Service:  In process, will continue to follow  Medicare Important Message Given:    Date Medicare IM Given:    Medicare IM give by:    Date Additional Medicare IM Given:    Additional Medicare Important Message give by:     If discussed at Long Length of Stay Meetings, dates discussed:    Additional Comments:  Lanier ClamMahabir, Candace Ramus, RN 12/20/2015, 10:29 AM

## 2015-12-20 NOTE — Care Management Obs Status (Signed)
MEDICARE OBSERVATION STATUS NOTIFICATION   Patient Details  Name: [name] MRN: [id number] Date of Birth: 03-18-1930   Medicare Observation Status Notification Given:  Yes    Lanier ClamMahabir, Aaren Atallah, RN 12/20/2015, 12:03 PM

## 2015-12-20 NOTE — Progress Notes (Signed)
Initial Nutrition Assessment  DOCUMENTATION CODES:   Obesity unspecified  INTERVENTION:  - Continue Ensure Enlive po BID, each supplement provides 350 kcal and 20 grams of protein - Will order 30 mL Prostat BID, each supplement provides 100 kcal and 15 grams of protein. - Continue daily multivitamin with minerals - RD will continue to monitor for needs  NUTRITION DIAGNOSIS:   Increased nutrient needs related to wound healing as evidenced by estimated needs.  GOAL:   Patient will meet greater than or equal to 90% of their needs  MONITOR:   PO intake, Supplement acceptance, Weight trends, Labs, Skin, I & O's  REASON FOR ASSESSMENT:   Malnutrition Screening Tool  ASSESSMENT:   82 y.o. female with below past medical history who was brought from her nursing home due to symptomatic anemia. CBC done earlier showed a hemoglobin level of 6.6 g/dL. Per the daughter, and she was getting IV antibiotics and IV fluids through a PICC line.   Pt seen for MST. BMI indicates obesity. No intakes documented in the chart. Pt sleeping at this time with noted hx of Alzheimer's dementia; no family/visitors at bedside. Unable to obtain PTA information at this time. Did not complete physical assessment with respect for pt comfort. Per weight hx review, pt has lost 5 lbs (2.5% body weight) in the past 1 week which is significant for time frame. Pt likely meets criteria for malnutrition in the acute setting but unable to confirm at this time; will need to complete physical assessment at follow-up and document accordingly.   Likely not meeting needs at this time. Medications reviewed. Labs reviewed; K: 3.4 mmol/L.   Diet Order:  Diet heart healthy/carb modified Room service appropriate?: Yes; Fluid consistency:: Thin  Skin:  Wound (see comment) (Stage 4 buttocks pressure injury)  Last BM:  PTA  Height:   Ht Readings from Last 1 Encounters:  12/19/15 5\' 7"  (1.702 m)    Weight:   Wt Readings from  Last 1 Encounters:  12/19/15 198 lb 1.6 oz (89.858 kg)    Ideal Body Weight:  61.36 kg (kg)  BMI:  Body mass index is 31.02 kg/(m^2).  Estimated Nutritional Needs:   Kcal:  2100-2300  Protein:  105-115 grams  Fluid:  >/= 2 L/day  EDUCATION NEEDS:   No education needs identified at this time     Trenton GammonJessica Cassey Hurrell, RD, LDN Inpatient Clinical Dietitian Pager # [phone] After hours/weekend pager # [phone]

## 2015-12-20 NOTE — Discharge Summary (Addendum)
Physician Discharge Summary  [name] HYH:[id number] DOB: 18-Aug-1930 DOA: 12/19/2015  PCP: Blanchie Serve, MD  Admit date: 12/19/2015 Discharge date: 12/20/2015  Time spent: 35 minutes  Recommendations for Outpatient Follow-up:  Repeat CBC in 5 days to follow Hgb trend Continue wound care therapy and preventive measurements Please have SPL assess swallowing capacity and safety  Discharge Diagnoses:    Symptomatic anemia   Type 2 diabetes mellitus ([place])   Essential hypertension, benign   Aortic stenosis   CAD (coronary artery disease)   Alzheimer's disease   Stage 4 decubitus Pressure ulcer/osteomyelitis; with positive cx for E. Coli    HLD  Discharge Condition: stable for discharge. No CP or SOB. Patient will be transfer back to SNF.  Diet recommendation: modified carbohydrates and heart healthy diet   Filed Weights   12/19/15 1626 12/19/15 2130  Weight: 92.987 kg (205 lb) 89.858 kg (198 lb 1.6 oz)    History of present illness:  As per H&P written by Dr. Olevia Bowens on 4/20 82 y.o. female with below past medical history who was brought from her nursing home due to symptomatic anemia. CBC done earlier showed a hemoglobin level of 6.6 g/dL. Per the daughter she was getting IV antibiotics and IV fluids through a PICC line at the facility.  No fever, no CP, no SOB. Mainly transfer for transfusion given critically low Hgb.  Hospital Course:  Symptomatic anemia -status post Transfusion 2 units of packed RBCs. -post transfusion Hgb 10.1 -no signs of overt bleeding or signs of GIB -most likely anemia of chronic disease and chronic wound oozing    Type 2 diabetes mellitus (HCC)/ HLD -Carbohydrate modified diet. -continue statins    Essential hypertension, benign -Stable. -Continue current antihypertensive regimen   Aortic stenosis -Severe grade per last echo. -Not a surgical candidate at this time.   CAD (coronary artery disease) -Stable. -Continue aspirin and  atorvastatin.   Alzheimer's disease -Continue Aricept 10 mg by mouth daily. -Despite advanced dementia, her family wants her to remain full code. -Continue Supportive care. -Long-term prognosis is poor. -mild episode of choking reported by nurse; will recommend follow up and assessment by SPL at SNF   stage 4 decubitus Pressure ulcer/osteomyelitis: with positive cultures for E. Coli -continue wound care and preventive measures -per wound care center, plan is to treat with IV rocephin for 6 weeks -non toxic at discharge  Procedures:  See below for x-ray reports   Consultations:  None   Discharge Exam: Filed Vitals:   12/20/15 0923 12/20/15 1343  BP: 118/95 122/88  Pulse: 105 102  Temp: 97.6 F (36.4 C) 97.8 F (36.6 C)  Resp: 18 20    General: oriented to person, afebrile and in NAD. Denies CP and SOB Cardiovascular: positive SEM, no rubs or gallops Respiratory: good air movement, no wheezing or crackles Abd: soft, NT, ND, positive BS   Discharge Instructions   Discharge Instructions    Diet - low sodium heart healthy    Complete by:  As directed      Discharge instructions    Complete by:  As directed   Take medications as prescribed Repeat CBC in 5 days to follow Hgb trend Continue wound care therapy and preventive measurements Please have SPL assess swallowing capacity and safety          Current Discharge Medication List    START taking these medications   Details  cefTRIAXone 1 g in dextrose 5 % 50 mL Inject 1 g into  the vein daily.      CONTINUE these medications which have CHANGED   Details  HYDROcodone-acetaminophen (NORCO/VICODIN) 5-325 MG tablet Take 1 tablet by mouth every 8 (eight) hours as needed for moderate pain. Give at 6AM and 6PM Qty: 10 tablet, Refills: 0      CONTINUE these medications which have NOT CHANGED   Details  !! acetaminophen (TYLENOL) 325 MG tablet Take 650 mg by mouth daily at 12 noon. Give at noon    aspirin EC  81 MG tablet Take 81 mg by mouth daily.    atorvastatin (LIPITOR) 40 MG tablet Take 40 mg by mouth daily.     calcium-vitamin D (OSCAL WITH D) 500-200 MG-UNIT tablet Take 1 tablet by mouth 2 (two) times daily.    diclofenac sodium (VOLTAREN) 1 % GEL Apply 2 g topically 4 (four) times daily as needed (pain). Reported on 12/19/2015    donepezil (ARICEPT ODT) 10 MG disintegrating tablet Take 10 mg by mouth at bedtime.    ferrous sulfate 325 (65 FE) MG tablet Take 325 mg by mouth 2 (two) times daily.    mirtazapine (REMERON) 15 MG tablet Take 15 mg by mouth at bedtime.    Multiple Vitamins-Minerals (DECUBI-VITE) CAPS Take 1 capsule by mouth daily.    Nutritional Supplements (NUTRITIONAL SHAKE PO) Take by mouth 2 (two) times daily. Start Mighty Shake BID with MedPass (2PM, 8PM)    polyethylene glycol (MIRALAX / GLYCOLAX) packet Take 17 g by mouth 2 (two) times daily.    Protein (PROCEL 100) POWD Take 2 scoop by mouth 2 (two) times daily.    saccharomyces boulardii (FLORASTOR) 250 MG capsule Take 250 mg by mouth 2 (two) times daily. Take for one week    senna-docusate (SENNA S) 8.6-50 MG tablet Take 2 tablets by mouth 2 (two) times daily.    !! acetaminophen (TYLENOL) 325 MG tablet Take 650 mg by mouth every 4 (four) hours as needed for mild pain or moderate pain (For pain or temperature greater than 99.5 orally).     Lavender Oil OIL S/O- INSOMINIA /AGITATION/ RESTLESSNESS/ ANXIETY- Apply a drop of lavendar oil or [place] Oil behind each ear per standing order. Repeat PRN. Discontinue use if skin irritation occurs.     !! - Potential duplicate medications found. Please discuss with provider.    STOP taking these medications     acetaminophen (TYLENOL) 650 MG suppository        Allergies  Allergen Reactions  . Latex Rash    The results of significant diagnostics from this hospitalization (including imaging, microbiology, ancillary and laboratory) are listed below for reference.     Significant Diagnostic Studies: Dg Chest 1 View  12/19/2015  CLINICAL DATA:  Altered mental status EXAM: CHEST 1 VIEW COMPARISON:  10/14/2015 FINDINGS: Right upper extremity PICC has been placed with its tip at the cavoatrial junction. The heart is upper normal in size. Dual lead left subclavian pacemaker device and leads are stable and intact. Lungs are under aerated and grossly clear. No pneumothorax. IMPRESSION: No active cardiopulmonary disease. Electronically Signed   By: Marybelle Killings M.D.   On: 12/19/2015 16:41   Ir Fluoro Guide Cv Line Right  12/13/2015  INDICATION: Infection, decubitus ulcers, access for antibiotics EXAM: ULTRASOUND AND FLUOROSCOPIC GUIDED PICC LINE INSERTION MEDICATIONS: None. CONTRAST:  None FLUOROSCOPY TIME:  18 seconds (5 mGy) COMPLICATIONS: None immediate. TECHNIQUE: The procedure, risks, benefits, and alternatives were explained to the patient's family and informed written consent was obtained.  A timeout was performed prior to the initiation of the procedure. The right upper extremity was prepped with chlorhexidine in a sterile fashion, and a sterile drape was applied covering the operative field. Maximum barrier sterile technique with sterile gowns and gloves were used for the procedure. A timeout was performed prior to the initiation of the procedure. Local anesthesia was provided with 1% lidocaine. Under direct ultrasound guidance, the patent right basilic vein was accessed with a micropuncture kit after the overlying soft tissues were anesthetized with 1% lidocaine. An ultrasound image was saved for documentation purposes. A guidewire was advanced to the level of the superior caval-atrial junction for measurement purposes and the PICC line was cut to length. A peel-away sheath was placed and a 42 cm, 5 [place], single lumen was inserted to level of the superior caval-atrial junction. A post procedure spot fluoroscopic was obtained. The catheter easily aspirated and flushed  and was sutured in place. A dressing was placed. The patient tolerated the procedure well without immediate post procedural complication. FINDINGS: After catheter placement, the tip lies within the superior cavoatrial junction. The catheter aspirates and flushes normally and is ready for immediate use. IMPRESSION: Successful ultrasound and fluoroscopic guided placement of a right basilic vein approach, 42 cm, 5 [place], single lumen PICC with tip at the superior caval-atrial junction. The PICC line is ready for immediate use. Electronically Signed   By: Jerilynn Mages.  Shick M.D.   On: 12/13/2015 14:18   Ir US Guide Vasc Access Right  12/13/2015  INDICATION: Infection, decubitus ulcers, access for antibiotics EXAM: ULTRASOUND AND FLUOROSCOPIC GUIDED PICC LINE INSERTION MEDICATIONS: None. CONTRAST:  None FLUOROSCOPY TIME:  18 seconds (5 mGy) COMPLICATIONS: None immediate. TECHNIQUE: The procedure, risks, benefits, and alternatives were explained to the patient's family and informed written consent was obtained. A timeout was performed prior to the initiation of the procedure. The right upper extremity was prepped with chlorhexidine in a sterile fashion, and a sterile drape was applied covering the operative field. Maximum barrier sterile technique with sterile gowns and gloves were used for the procedure. A timeout was performed prior to the initiation of the procedure. Local anesthesia was provided with 1% lidocaine. Under direct ultrasound guidance, the patent right basilic vein was accessed with a micropuncture kit after the overlying soft tissues were anesthetized with 1% lidocaine. An ultrasound image was saved for documentation purposes. A guidewire was advanced to the level of the superior caval-atrial junction for measurement purposes and the PICC line was cut to length. A peel-away sheath was placed and a 42 cm, 5 [place], single lumen was inserted to level of the superior caval-atrial junction. A post procedure spot  fluoroscopic was obtained. The catheter easily aspirated and flushed and was sutured in place. A dressing was placed. The patient tolerated the procedure well without immediate post procedural complication. FINDINGS: After catheter placement, the tip lies within the superior cavoatrial junction. The catheter aspirates and flushes normally and is ready for immediate use. IMPRESSION: Successful ultrasound and fluoroscopic guided placement of a right basilic vein approach, 42 cm, 5 French, single lumen PICC with tip at the superior caval-atrial junction. The PICC line is ready for immediate use. Electronically Signed   By: Jerilynn Mages.  Shick M.D.   On: 12/13/2015 14:18    Microbiology: Recent Results (from the past 240 hour(s))  MRSA PCR Screening     Status: Abnormal   Collection Time: 12/19/15  9:46 PM  Result Value Ref Range Status   MRSA by PCR  POSITIVE (A) NEGATIVE Final    Comment:        The GeneXpert MRSA Assay (FDA approved for NASAL specimens only), is one component of a comprehensive MRSA colonization surveillance program. It is not intended to diagnose MRSA infection nor to guide or monitor treatment for MRSA infections. RESULT CALLED TO, READ BACK BY AND VERIFIED WITH: [facility] RN [phone] [place],N      Labs: Basic Metabolic Panel:  Recent Labs Lab 12/19/15 1644 12/19/15 2010  NA 143  --   K 3.4*  --   CL 107  --   CO2 26  --   GLUCOSE 154*  --   BUN 13  --   CREATININE 0.79  --   CALCIUM 9.6  --   MG  --  1.9  PHOS  --  2.2*   Liver Function Tests:  Recent Labs Lab 12/19/15 1644  AST 24  ALT 12*  ALKPHOS 86  BILITOT 0.5  PROT 7.0  ALBUMIN 1.9*   CBC:  Recent Labs Lab 12/19/15 1644 12/20/15 1334  WBC 11.1* 9.8  NEUTROABS 7.6  --   HGB 7.5* 10.1*  HCT 26.1* 33.5*  MCV 70.7* 74.6*  PLT 430* 348    CBG:  Recent Labs Lab 12/20/15 0552 12/20/15 1147  GLUCAP 86 109*    Signed:  Barton Dubois MD.  [facility] 12/20/2015, 3:09  PM

## 2015-12-20 NOTE — Care Management Note (Signed)
Case Management Note  Patient Details  Name: [name] MRN: [id number] Date of Birth: Jan 16, 1930  Subjective/Objective:                    Action/Plan:d/c SNF.   Expected Discharge Date:   (unknown)               Expected Discharge Plan:  Skilled Nursing Facility  In-House Referral:  Clinical Social Work  Discharge planning Services  CM Consult  Post Acute Care Choice:    Choice offered to:     DME Arranged:    DME Agency:     HH Arranged:    HH Agency:     Status of Service:  Completed, signed off  Medicare Important Message Given:    Date Medicare IM Given:    Medicare IM give by:    Date Additional Medicare IM Given:    Additional Medicare Important Message give by:     If discussed at Long Length of Stay Meetings, dates discussed:    Additional Comments:  Lanier ClamMahabir, Lura Falor, RN 12/20/2015, 3:20 PM

## 2015-12-21 DIAGNOSIS — D649 Anemia, unspecified: Secondary | ICD-10-CM | POA: Diagnosis not present

## 2015-12-21 LAB — URINE CULTURE: Culture: 10000 — AB

## 2015-12-23 ENCOUNTER — Encounter: Payer: Self-pay | Admitting: Adult Health

## 2015-12-23 ENCOUNTER — Non-Acute Institutional Stay (SKILLED_NURSING_FACILITY): Payer: Medicare Other | Admitting: Adult Health

## 2015-12-23 DIAGNOSIS — R1310 Dysphagia, unspecified: Secondary | ICD-10-CM

## 2015-12-23 DIAGNOSIS — F0281 Dementia in other diseases classified elsewhere with behavioral disturbance: Secondary | ICD-10-CM | POA: Diagnosis not present

## 2015-12-23 DIAGNOSIS — D62 Acute posthemorrhagic anemia: Secondary | ICD-10-CM

## 2015-12-23 DIAGNOSIS — K5901 Slow transit constipation: Secondary | ICD-10-CM

## 2015-12-23 DIAGNOSIS — G308 Other Alzheimer's disease: Secondary | ICD-10-CM

## 2015-12-23 DIAGNOSIS — E785 Hyperlipidemia, unspecified: Secondary | ICD-10-CM | POA: Diagnosis not present

## 2015-12-23 DIAGNOSIS — R5381 Other malaise: Secondary | ICD-10-CM | POA: Diagnosis not present

## 2015-12-23 DIAGNOSIS — E118 Type 2 diabetes mellitus with unspecified complications: Secondary | ICD-10-CM | POA: Diagnosis not present

## 2015-12-23 DIAGNOSIS — L8994 Pressure ulcer of unspecified site, stage 4: Secondary | ICD-10-CM

## 2015-12-23 DIAGNOSIS — F329 Major depressive disorder, single episode, unspecified: Secondary | ICD-10-CM | POA: Diagnosis not present

## 2015-12-23 DIAGNOSIS — F32A Depression, unspecified: Secondary | ICD-10-CM

## 2015-12-23 DIAGNOSIS — M8610 Other acute osteomyelitis, unspecified site: Secondary | ICD-10-CM

## 2015-12-23 DIAGNOSIS — E43 Unspecified severe protein-calorie malnutrition: Secondary | ICD-10-CM

## 2015-12-23 DIAGNOSIS — F02818 Dementia in other diseases classified elsewhere, unspecified severity, with other behavioral disturbance: Secondary | ICD-10-CM

## 2015-12-23 LAB — TYPE AND SCREEN
ABO/RH(D): B POS
ANTIBODY SCREEN: NEGATIVE
UNIT DIVISION: 0
UNIT DIVISION: 0

## 2015-12-23 NOTE — Progress Notes (Signed)
Patient ID: [name], female   DOB: 02/07/1930, 82 y.o.   MRN: [id number]    DATE:  12/23/15   MRN:  [id number]  BIRTHDAY: 11/27/1929  Facility:  Nursing Home Location:  [facility]  Nursing Home Room Number: 203-2  LEVEL OF CARE:  SNF [phone])  Contact Information    Name Relation Home Work [place] Daughter [phone]  [phone]   Chevis Pretty   [phone]   Steva Colder   [phone]       Code Status History    Date Active Date Inactive Code Status Order ID Comments User Context   12/19/2015  9:32 PM 12/21/2015  4:43 AM Full Code [id number]  Bobette Mo, MD Inpatient   10/14/2015  8:50 PM 10/21/2015  4:06 PM Full Code [id number]  Briscoe Deutscher, MD ED   05/17/2015  7:27 PM 05/24/2015  5:49 PM Full Code [id number]  Jeralyn Bennett, MD Inpatient   11/23/2013  8:36 PM 11/24/2013 11:52 PM Full Code [id number]  Martha Clan, MD Inpatient       Chief Complaint  Patient presents with  . Hospitalization Follow-up    HISTORY OF PRESENT ILLNESS:  This is an 82 year old female who has been admitted to [facility] on 12/21/15 from [facility]. She has PMH of hypertension, diabetes mellitus, CAD and dementia. She was transferred to the hospital due to low hemoglobin of 6. She was transfused 2 units packed RBC and her hemoglobin improved to 10.1. She was transferred back to the facility. Before she was transferred to the hospital, she was diagnosed with Escherichia coli osteomyelitis from bone culture in the sacral wound and had PICC line for Rocephin. This was continued in the hospital.  PAST MEDICAL HISTORY:  Past Medical History  Diagnosis Date  . DM type 2 (diabetes mellitus, type 2) (HCC)   . Hyperlipidemia   . HTN (hypertension)   . CAD (coronary artery disease)     s/p NSTEMI 2003 with stent placement OM1.  PTCA with cutting balloon prox LAD. 2005 admission with chest pain, BMS placed to CFX and DES placed in RCA   . Aortic stenosis     moderate to severe  . Anemia of chronic disease     Symptomatic  . CRI (chronic renal insufficiency)   . CVA (cerebral infarction)     remote; recovered  . Second degree Mobitz II AV block     s/p PPM 12/2009  . Hx of colonoscopy   . Headache 2003  . GERD (gastroesophageal reflux disease)   . Alzheimer's disease 07/2011  . PAD (peripheral artery disease) (HCC)   . Atherosclerosis   . Osteoporosis   . Fall (on)(from) incline, initial encounter   . Atherosclerosis of artery   . Pressure ulcer   . UTI (lower urinary tract infection)   . Thrombocytopenia (HCC)   . Hypernatremia   . Generalized weakness   . Right arm pain   . Protein calorie malnutrition (HCC)   . Chronic constipation   . Stage 4 decubitus ulcer (HCC)     Positive for E. coli     CURRENT MEDICATIONS: Reviewed  Patient's Medications  New Prescriptions   No medications on file  Previous Medications   ACETAMINOPHEN (TYLENOL) 325 MG TABLET    Take 650 mg by mouth every 4 (four) hours as needed for mild pain or moderate pain (For pain or temperature greater than 99.5 orally).    ACETAMINOPHEN (TYLENOL) 325  MG TABLET    Take 650 mg by mouth daily at 12 noon. Give at noon   ASPIRIN EC 81 MG TABLET    Take 81 mg by mouth daily.   ATORVASTATIN (LIPITOR) 40 MG TABLET    Take 40 mg by mouth daily.    CALCIUM-VITAMIN D (OSCAL WITH D) 500-200 MG-UNIT TABLET    Take 1 tablet by mouth 2 (two) times daily.   CEFTRIAXONE (ROCEPHIN) 1 G INJECTION    Inject 1 g into the vein daily.   DICLOFENAC SODIUM (VOLTAREN) 1 % GEL    Apply 2 g topically 4 (four) times daily as needed (pain). Reported on 12/19/2015   DONEPEZIL (ARICEPT ODT) 10 MG DISINTEGRATING TABLET    Take 10 mg by mouth at bedtime.   FERROUS SULFATE 325 (65 FE) MG TABLET    Take 325 mg by mouth 2 (two) times daily.   HYDROCODONE-ACETAMINOPHEN (NORCO/VICODIN) 5-325 MG TABLET    Take 1 tablet by mouth 2 (two) times daily. Take at 6AM and 6PM    LAVENDER OIL OIL    S/O- INSOMINIA /AGITATION/ RESTLESSNESS/ ANXIETY- Apply a drop of lavendar oil or [place] Oil behind each ear per standing order. Repeat PRN. Discontinue use if skin irritation occurs.   MIRTAZAPINE (REMERON) 15 MG TABLET    Take 15 mg by mouth at bedtime.   MULTIPLE VITAMINS-MINERALS (DECUBI-VITE) CAPS    Take 1 capsule by mouth daily.   NUTRITIONAL SUPPLEMENTS (NUTRITIONAL SHAKE PO)    Take by mouth 2 (two) times daily. Start Mighty Shake BID with MedPass (2PM, 8PM)   POLYETHYLENE GLYCOL (MIRALAX / GLYCOLAX) PACKET    Take 17 g by mouth 2 (two) times daily.   PROTEIN (PROCEL 100) POWD    Take 2 scoop by mouth 2 (two) times daily.   SACCHAROMYCES BOULARDII (FLORASTOR) 250 MG CAPSULE    Take 250 mg by mouth 2 (two) times daily. Take for one week   SENNA-DOCUSATE (SENNA S) 8.6-50 MG TABLET    Take 2 tablets by mouth 2 (two) times daily.   UNABLE TO FIND    Med Name: Med pass 90 mL 3 times daily  Modified Medications   Modified Medication Previous Medication   HYDROCODONE-ACETAMINOPHEN (NORCO/VICODIN) 5-325 MG TABLET HYDROcodone-acetaminophen (NORCO/VICODIN) 5-325 MG tablet      Take one tablet by mouth every 8 hours as needed for moderate pain. Do not exceed 4gm of Tylenol in 24 hours    Take 1 tablet by mouth every 8 (eight) hours as needed for moderate pain. Give at 6AM and 6PM  Discontinued Medications   CEFTRIAXONE 1 G IN DEXTROSE 5 % 50 ML    Inject 1 g into the vein daily.     Allergies  Allergen Reactions  . Latex Rash     REVIEW OF SYSTEMS:  Unable to obtain due to advanced dementia   PHYSICAL EXAMINATION  GENERAL APPEARANCE: Well nourished. In no acute distress. Obese SKIN:  Has gluteal cleft pressure ulcer stage 4 HEAD: Normal in size and contour. No evidence of trauma EYES: Lids open and close normally. No blepharitis, entropion or ectropion. PERRL. Conjunctivae are clear and sclerae are white. Lenses are without opacity EARS: Pinnae are normal.  Patient hears normal voice tunes of the examiner MOUTH and THROAT: Lips are without lesions. Oral mucosa is moist and without lesions. Tongue is normal in shape, size, and color and without lesions NECK: supple, trachea midline, no neck masses, no thyroid tenderness, no thyromegaly LYMPHATICS: no  LAN in the neck, no supraclavicular LAN RESPIRATORY: breathing is even & unlabored, BS CTAB CARDIAC: RRR, + murmur,no extra heart sounds, no edema GI: abdomen soft, normal BS, no masses, no tenderness, no hepatomegaly, no splenomegaly EXTREMITIES:  Able to move BUE, unable to move BLE; Right upper arm SL PICC PSYCHIATRIC: Alert to self, disoriented to time and place. Affect and behavior are appropriate  LABS/RADIOLOGY: Labs reviewed: Basic Metabolic Panel:  Recent Labs  09/81/19 0428  10/19/15 0747 10/20/15 0648  12/19/15 1644 12/19/15 2010    NA 144  < > 141 142  < > 143  --     K 3.0*  < > 4.0 3.9  < > 3.4*  --     CL 108  < > 113* 109  --  107  --     CO2 28  < > 22 26  --  26  --     GLUCOSE 138*  < > 182* 124*  --  154*  --     BUN 39*  < > 17 13  < > 13  --     CREATININE 1.34*  < > 0.77 0.72  < > 0.79  --     CALCIUM 9.7  < > 9.2 9.7  --  9.6  --     MG 1.8  --   --   --   --   --  1.9    PHOS  --   --   --   --   --   --  2.2*    < > = values in this interval not displayed. Liver Function Tests:  Recent Labs  10/14/15 1529 10/15/15 0649 12/13/15 12/19/15 1644   AST 65* 51* 20 24   ALT 59* 48 11 12*   ALKPHOS 89 72 84 86   BILITOT 0.8 0.7  --  0.5   PROT 7.2 6.3*  --  7.0   ALBUMIN 2.7* 2.3*  --  1.9*    CBC:  Recent Labs  10/20/15 0648  12/19/15 1644 12/20/15 1334     WBC 6.3  < > 11.1* 9.8     NEUTROABS  --   < > 7.6  --      HGB 10.9*  < > 7.5* 10.1*     HCT 35.1*  < > 26.1* 33.5*     MCV 78.5  --  70.7* 74.6*     PLT 144*  < > 430* 348     < > = values in this interval not displayed.  Cardiac Enzymes:  Recent Labs  05/17/15 1306  TROPONINI 0.08*    CBG:  Recent Labs  12/20/15 0552 12/20/15 1147 12/20/15 1707  GLUCAP 86 109* 80     Dg Chest 1 View  12/19/2015  CLINICAL DATA:  Altered mental status EXAM: CHEST 1 VIEW COMPARISON:  10/14/2015 FINDINGS: Right upper extremity PICC has been placed with its tip at the cavoatrial junction. The heart is upper normal in size. Dual lead left subclavian pacemaker device and leads are stable and intact. Lungs are under aerated and grossly clear. No pneumothorax. IMPRESSION: No active cardiopulmonary disease. Electronically Signed   By: Jolaine Click M.D.   On: 12/19/2015 16:41    ASSESSMENT/PLAN:  Physical deconditioning - for rehabilitation  Anemia, acute blood loss -  S/P transfusion of 2 units  PRBC; hemoglobin 10.1; continue ferrous sulfate 325 mg 1 tab by mouth twice a day  Escherichia coli osteomyelitis -  continue Rocephin 1 g IV daily; continue air mattress  Dysphagia - pure diet with thin liquids; for ST evaluation  Constipation - continue MiraLAX 17 g by mouth twice a day and senna S2 tabs by mouth twice a day  Protein calorie malnutrition, severe - albumin 1.9; continue Procel 2 scoops by mouth twice a day and med Pass 90 mL by mouth 3 times a day  Alzheimer's dementia - continue Aricept 10 mg by mouth daily at bedtime  Hyperlipidemia - continue Lipitor 40 mg 1 tab by mouth daily  Diabetes mellitus, type II - diet controlled  Depression - mood is stable; continue Remeron 15 mg by mouth daily at bedtime; check BMP  Sacral pressure ulcer - continue daily wound treatment and air mattress    Goals of care:  Long-term care    Kenard Gower, NP [facility] [phone]

## 2015-12-24 ENCOUNTER — Encounter: Payer: Self-pay | Admitting: Internal Medicine

## 2015-12-24 ENCOUNTER — Non-Acute Institutional Stay (SKILLED_NURSING_FACILITY): Payer: Medicare Other | Admitting: Internal Medicine

## 2015-12-24 DIAGNOSIS — D62 Acute posthemorrhagic anemia: Secondary | ICD-10-CM | POA: Diagnosis not present

## 2015-12-24 DIAGNOSIS — E119 Type 2 diabetes mellitus without complications: Secondary | ICD-10-CM | POA: Diagnosis not present

## 2015-12-24 DIAGNOSIS — R5381 Other malaise: Secondary | ICD-10-CM

## 2015-12-24 DIAGNOSIS — G309 Alzheimer's disease, unspecified: Secondary | ICD-10-CM

## 2015-12-24 DIAGNOSIS — E46 Unspecified protein-calorie malnutrition: Secondary | ICD-10-CM

## 2015-12-24 DIAGNOSIS — L89154 Pressure ulcer of sacral region, stage 4: Secondary | ICD-10-CM | POA: Diagnosis not present

## 2015-12-24 DIAGNOSIS — K5900 Constipation, unspecified: Secondary | ICD-10-CM

## 2015-12-24 DIAGNOSIS — E785 Hyperlipidemia, unspecified: Secondary | ICD-10-CM

## 2015-12-24 DIAGNOSIS — I10 Essential (primary) hypertension: Secondary | ICD-10-CM | POA: Diagnosis not present

## 2015-12-24 DIAGNOSIS — R1310 Dysphagia, unspecified: Secondary | ICD-10-CM | POA: Diagnosis not present

## 2015-12-24 DIAGNOSIS — F0280 Dementia in other diseases classified elsewhere without behavioral disturbance: Secondary | ICD-10-CM

## 2015-12-24 DIAGNOSIS — M4628 Osteomyelitis of vertebra, sacral and sacrococcygeal region: Secondary | ICD-10-CM | POA: Diagnosis not present

## 2015-12-24 DIAGNOSIS — K5909 Other constipation: Secondary | ICD-10-CM

## 2015-12-24 LAB — CBC AND DIFFERENTIAL
HEMATOCRIT: 32 % — AB (ref 36–46)
Hemoglobin: 8.9 g/dL — AB (ref 12.0–16.0)
PLATELETS: 343 10*3/uL (ref 150–399)
WBC: 7.8 10^3/mL

## 2015-12-24 LAB — BASIC METABOLIC PANEL
BUN: 10 mg/dL (ref 4–21)
CREATININE: 0.6 mg/dL (ref 0.5–1.1)
GLUCOSE: 116 mg/dL
Potassium: 3.6 mmol/L (ref 3.4–5.3)
Sodium: 143 mmol/L (ref 137–147)

## 2015-12-24 NOTE — Progress Notes (Signed)
LOCATION: [place]  PCP: Blanchie Serve, MD   Code Status: Full Code  Goals of care: Advanced Directive information Advanced Directives 12/19/2015  Does patient have an advance directive? No  Would patient like information on creating an advanced directive? No - patient declined information       Extended Emergency Contact Information Primary Emergency Contact: Blackmon,Denise Address: [place], [place] Phone: [phone] Mobile Phone: [phone] Relation: Daughter Secondary Emergency Contact: Hockett,Timothy Address: [address]          [place], [place] [address] Johnnette Litter of [employer] Phone: [phone] Relation: Son   Allergies  Allergen Reactions  . Latex Rash    Chief Complaint  Patient presents with  . Readmit To SNF    Readmission     HPI:  Patient is a 82 y.o. female seen today for long term care post hospital admission from 12/19/15-12/20/15 with low hemoglobin of 6. She received 2 u prbc transfusion for symptomatic anemia and her HB improved to 10.1. No signs of gi bleed noted. She has returned back to the facility for long term care. 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 This was continued in the hospital. She has PMH of HTN, DM, AS CAD, dementia among others. She is seen in her room today. Unable to obtain HPI and ROS. She is currently under total care  Review of Systems: Patient not participating in ROS or HPI   Past Medical History  Diagnosis Date  . DM type 2 (diabetes mellitus, type 2) ([place])   . Hyperlipidemia   . HTN (hypertension)   . CAD (coronary artery disease)     s/p NSTEMI 2003 with stent placement OM1.  PTCA with cutting balloon prox LAD. 2005 admission with chest pain, BMS placed to CFX and DES placed in RCA    . Aortic stenosis     moderate to severe  . Anemia of chronic disease     Symptomatic  . CRI (chronic renal insufficiency)   . CVA (cerebral infarction)     remote; recovered  . Second degree Mobitz II AV block     s/p PPM 12/2009  . Hx of colonoscopy   . Headache 2003  . GERD (gastroesophageal reflux disease)   . Alzheimer's disease 07/2011  . PAD (peripheral artery disease) ([place])   . Atherosclerosis   . Osteoporosis   . Fall (on)(from) incline, initial encounter   . Atherosclerosis of artery   . Pressure ulcer   . UTI (lower urinary tract infection)   . Thrombocytopenia ([place])   . Hypernatremia   . Generalized weakness   . Right arm pain   . Protein calorie malnutrition ([place])   . Chronic constipation   . Stage 4 decubitus ulcer ([place])     Positive for E. coli   Past Surgical History  Procedure Laterality Date  . Hysterectomy    . Benign vocal cord tumor removed    . Right knee surgery      x 3 with TKA  . Pacemaker insertion  01/10/2010    SJM Accent DR RF implanted by Dr Rayann Heman   Social History:   reports that she quit smoking about 25 years ago. She has never used smokeless  tobacco. She reports that she does not drink alcohol or use illicit drugs.  Family History  Problem Relation Age of Onset  . Coronary artery disease    . Hypertension    . Diabetes Mother   . Diabetes Father   . Heart disease Mother   . Colon cancer Neg Hx   . Stomach cancer Sister     Medications:   Medication List       This list is accurate as of: 12/24/15 11:48 AM.  Always use your most recent med list.               acetaminophen 325 MG tablet  Commonly known as:  TYLENOL  Take 650 mg by mouth every 4 (four) hours as needed for mild pain or moderate pain (For pain or temperature greater than 99.5 orally).     acetaminophen 325 MG tablet  Commonly known as:  TYLENOL  Take 650 mg by mouth daily at 12 noon. Give at noon     aspirin EC 81 MG tablet  Take 81 mg by mouth  daily.     atorvastatin 40 MG tablet  Commonly known as:  LIPITOR  Take 40 mg by mouth daily.     calcium-vitamin D 500-200 MG-UNIT tablet  Commonly known as:  OSCAL WITH D  Take 1 tablet by mouth 2 (two) times daily.     cefTRIAXone 1 g injection  Commonly known as:  ROCEPHIN  Inject 1 g into the vein daily.     DECUBI-VITE Caps  Take 1 capsule by mouth daily.     diclofenac sodium 1 % Gel  Commonly known as:  VOLTAREN  Apply 2 g topically 4 (four) times daily as needed (pain). Reported on 12/19/2015     donepezil 10 MG disintegrating tablet  Commonly known as:  ARICEPT ODT  Take 10 mg by mouth at bedtime.     ferrous sulfate 325 (65 FE) MG tablet  Take 325 mg by mouth 2 (two) times daily.     HYDROcodone-acetaminophen 5-325 MG tablet  Commonly known as:  NORCO/VICODIN  Take 1 tablet by mouth every 8 (eight) hours as needed for moderate pain. Give at 6AM and 6PM     Lavender Oil Oil  S/O- INSOMINIA /AGITATION/ RESTLESSNESS/ ANXIETY- Apply a drop of lavendar oil or [place] Oil behind each ear per standing order. Repeat PRN. Discontinue use if skin irritation occurs.     mirtazapine 15 MG tablet  Commonly known as:  REMERON  Take 15 mg by mouth at bedtime.     NUTRITIONAL SHAKE PO  Take by mouth 2 (two) times daily. Start Mighty Shake BID with MedPass (2PM, 8PM)     polyethylene glycol packet  Commonly known as:  MIRALAX / GLYCOLAX  Take 17 g by mouth 2 (two) times daily.     PROCEL 100 Powd  Take 2 scoop by mouth 2 (two) times daily.     saccharomyces boulardii 250 MG capsule  Commonly known as:  FLORASTOR  Take 250 mg by mouth 2 (two) times daily. Take for one week     SENNA S 8.6-50 MG tablet  Generic drug:  senna-docusate  Take 2 tablets by mouth 2 (two) times daily.     UNABLE TO FIND  Med Name: Med pass 90 mL 3 times daily        Immunizations: Immunization History  Administered Date(s) Administered  . PPD Test 06/10/2015     Physical  Exam: Danley Danker  Vitals:   12/24/15 1142  BP: 122/58  Pulse: 72  Temp: 97.3 F (36.3 C)  TempSrc: Oral  Resp: 14  Height: '5\' 9"'  (1.753 m)  Weight: 203 lb 6.4 oz (92.262 kg)  SpO2: 98%   Body mass index is 30.02 kg/(m^2).  General- elderly female, obese but chronically ill appearing, in no acute distress Head- normocephalic, atraumatic Nose- no maxillary or frontal sinus tenderness, no nasal discharge Throat- moist mucus membrane Eyes- no pallor, no icterus, no discharge Neck- no cervical lymphadenopathy, no jugular vein distension Cardiovascular- normal M3,W4, systolic murmur +, bilateral leg edema + Respiratory- bilateral clear to auscultation, no wheeze, no rhonchi, no crackles, no use of accessory muscles Abdomen- bowel sounds present, soft, non tender Musculoskeletal- generalized weakness, swelling to hands noted, heel floaters present Nurological- unable to assess Skin- warm and dry, sacral stage 4 pressure ulcer, right arm picc line     Labs reviewed: Basic Metabolic Panel:  Recent Labs  05/24/15 0428  10/19/15 0747 10/20/15 0648  12/10/15 12/13/15 12/19/15 1644 12/19/15 2010  NA 144  < > 141 142  < > 146 145 143  --   K 3.0*  < > 4.0 3.9  < > 4.2 4.3 3.4*  --   CL 108  < > 113* 109  --   --   --  107  --   CO2 28  < > 22 26  --   --   --  26  --   GLUCOSE 138*  < > 182* 124*  --   --   --  154*  --   BUN 39*  < > 17 13  < > '14 13 13  ' --   CREATININE 1.34*  < > 0.77 0.72  < > 0.8 0.6 0.79  --   CALCIUM 9.7  < > 9.2 9.7  --   --   --  9.6  --   MG 1.8  --   --   --   --   --   --   --  1.9  PHOS  --   --   --   --   --   --   --   --  2.2*  < > = values in this interval not displayed. Liver Function Tests:  Recent Labs  10/14/15 1529 10/15/15 0649 12/13/15 12/19/15 1644  AST 65* 51* 20 24  ALT 59* 48 11 12*  ALKPHOS 89 72 84 86  BILITOT 0.8 0.7  --  0.5  PROT 7.2 6.3*  --  7.0  ALBUMIN 2.7* 2.3*  --  1.9*   No results for input(s): LIPASE, AMYLASE in  the last 8760 hours. No results for input(s): AMMONIA in the last 8760 hours. CBC:  Recent Labs  10/20/15 0648  11/20/15  12/13/15 12/19/15 1644 12/20/15 1334  WBC 6.3  < > 8.4  < > 11.0 11.1* 9.8  NEUTROABS  --   < > 5  --  7 7.6  --   HGB 10.9*  < > 9.3*  < > 7.6* 7.5* 10.1*  HCT 35.1*  < > 32*  < > 28* 26.1* 33.5*  MCV 78.5  --   --   --   --  70.7* 74.6*  PLT 144*  < > 391  < > 472* 430* 348  < > = values in this interval not displayed. Cardiac Enzymes:  Recent Labs  05/17/15 1306  TROPONINI 0.08*   BNP: Invalid  input(s): POCBNP CBG:  Recent Labs  12/20/15 0552 12/20/15 1147 12/20/15 1707  GLUCAP 86 109* 80    Radiological Exams: Dg Chest 1 View  12/19/2015  CLINICAL DATA:  Altered mental status EXAM: CHEST 1 VIEW COMPARISON:  10/14/2015 FINDINGS: Right upper extremity PICC has been placed with its tip at the cavoatrial junction. The heart is upper normal in size. Dual lead left subclavian pacemaker device and leads are stable and intact. Lungs are under aerated and grossly clear. No pneumothorax. IMPRESSION: No active cardiopulmonary disease. Electronically Signed   By: Marybelle Killings M.D.   On: 12/19/2015 16:41   Ir Fluoro Guide Cv Line Right  12/13/2015  INDICATION: Infection, decubitus ulcers, access for antibiotics EXAM: ULTRASOUND AND FLUOROSCOPIC GUIDED PICC LINE INSERTION MEDICATIONS: None. CONTRAST:  None FLUOROSCOPY TIME:  18 seconds (5 mGy) COMPLICATIONS: None immediate. TECHNIQUE: The procedure, risks, benefits, and alternatives were explained to the patient's family and informed written consent was obtained. A timeout was performed prior to the initiation of the procedure. The right upper extremity was prepped with chlorhexidine in a sterile fashion, and a sterile drape was applied covering the operative field. Maximum barrier sterile technique with sterile gowns and gloves were used for the procedure. A timeout was performed prior to the initiation of the  procedure. Local anesthesia was provided with 1% lidocaine. Under direct ultrasound guidance, the patent right basilic vein was accessed with a micropuncture kit after the overlying soft tissues were anesthetized with 1% lidocaine. An ultrasound image was saved for documentation purposes. A guidewire was advanced to the level of the superior caval-atrial junction for measurement purposes and the PICC line was cut to length. A peel-away sheath was placed and a 42 cm, 5 [place], single lumen was inserted to level of the superior caval-atrial junction. A post procedure spot fluoroscopic was obtained. The catheter easily aspirated and flushed and was sutured in place. A dressing was placed. The patient tolerated the procedure well without immediate post procedural complication. FINDINGS: After catheter placement, the tip lies within the superior cavoatrial junction. The catheter aspirates and flushes normally and is ready for immediate use. IMPRESSION: Successful ultrasound and fluoroscopic guided placement of a right basilic vein approach, 42 cm, 5 French, single lumen PICC with tip at the superior caval-atrial junction. The PICC line is ready for immediate use. Electronically Signed   By: Jerilynn Mages.  Shick M.D.   On: 12/13/2015 14:18   Ir US Guide Vasc Access Right  12/13/2015  INDICATION: Infection, decubitus ulcers, access for antibiotics EXAM: ULTRASOUND AND FLUOROSCOPIC GUIDED PICC LINE INSERTION MEDICATIONS: None. CONTRAST:  None FLUOROSCOPY TIME:  18 seconds (5 mGy) COMPLICATIONS: None immediate. TECHNIQUE: The procedure, risks, benefits, and alternatives were explained to the patient's family and informed written consent was obtained. A timeout was performed prior to the initiation of the procedure. The right upper extremity was prepped with chlorhexidine in a sterile fashion, and a sterile drape was applied covering the operative field. Maximum barrier sterile technique with sterile gowns and gloves were used for  the procedure. A timeout was performed prior to the initiation of the procedure. Local anesthesia was provided with 1% lidocaine. Under direct ultrasound guidance, the patent right basilic vein was accessed with a micropuncture kit after the overlying soft tissues were anesthetized with 1% lidocaine. An ultrasound image was saved for documentation purposes. A guidewire was advanced to the level of the superior caval-atrial junction for measurement purposes and the PICC line was cut to length. A peel-away sheath  was placed and a 42 cm, 5 [place], single lumen was inserted to level of the superior caval-atrial junction. A post procedure spot fluoroscopic was obtained. The catheter easily aspirated and flushed and was sutured in place. A dressing was placed. The patient tolerated the procedure well without immediate post procedural complication. FINDINGS: After catheter placement, the tip lies within the superior cavoatrial junction. The catheter aspirates and flushes normally and is ready for immediate use. IMPRESSION: Successful ultrasound and fluoroscopic guided placement of a right basilic vein approach, 42 cm, 5 French, single lumen PICC with tip at the superior caval-atrial junction. The PICC line is ready for immediate use. Electronically Signed   By: Jerilynn Mages.  Shick M.D.   On: 12/13/2015 14:18    Assessment/Plan  Physical deconditioning Will have her work with physical therapy and occupational therapy team to help with gait training and muscle strengthening exercises.fall precautions. Skin care. Encourage to be out of bed.   Blood loss anemia Thought to be from bleed through wound site. S/p 2 u prbc transfusion. Monitor cbc. Continue feso4 325 mg bid  E.coli osteomyelitis Of the sacral bone. Continue iv rocephin 1 g daily via right arm PICC line for total of 6 weeks from 12/14/15. Monitor cbc with diff and cmp with temperature curve  Sacral stage 4 pressure ulcer Continue wound care. Has air mattress.  Will need frequent repositioning and wound care. Continue decubivite to promote wound healing  Dysphagia Continue puree diet with thin liquids, assistance with feeding and aspiration precautions, SLP to follow  Constipation Chronic, continue miralax bid and senna s 2 tab bid   Protein calorie malnutrition Monitor po intake. Continue protein supplement. Continue remeron to help stimulate appetite  alzhiemer's dementia Under total care. Continue supportive care. Continue aricept, remeron   HLD Continue lipitor  Diabetes mellitus Lab Results  Component Value Date   HGBA1C 6.0* 11/23/2013  check a1c. Diet controlled at present  Hypertension  Stable, off all meds at present. Monitor. Continue baby aspirin   Goals of care: long term care   Labs/tests ordered: cbc with diff, cmp, a1c  12/30/15. Pending cbc from this am   Family/ staff Communication: reviewed care plan with nursing supervisor    Blanchie Serve, MD Internal Medicine [place], [place] [address] Cell Phone (Monday-Friday 8 am - 5 pm): [phone] On Call: [phone] and follow prompts after 5 pm and on weekends Office Phone: [phone] Office Fax: [phone]

## 2015-12-25 ENCOUNTER — Other Ambulatory Visit: Payer: Self-pay | Admitting: *Deleted

## 2015-12-25 MED ORDER — HYDROCODONE-ACETAMINOPHEN 5-325 MG PO TABS: ORAL_TABLET | ORAL | Status: DC

## 2015-12-25 NOTE — Telephone Encounter (Signed)
[facility] 

## 2015-12-26 DIAGNOSIS — L89154 Pressure ulcer of sacral region, stage 4: Secondary | ICD-10-CM | POA: Diagnosis not present

## 2015-12-30 LAB — HEPATIC FUNCTION PANEL
ALT: 11 U/L (ref 7–35)
AST: 19 U/L (ref 13–35)
Alkaline Phosphatase: 86 U/L (ref 25–125)
Bilirubin, Total: 0.4 mg/dL

## 2015-12-30 LAB — CBC AND DIFFERENTIAL
HEMATOCRIT: 34 % — AB (ref 36–46)
HEMOGLOBIN: 9.5 g/dL — AB (ref 12.0–16.0)
NEUTROS ABS: 4 /uL
Platelets: 337 10*3/uL (ref 150–399)
WBC: 6.7 10*3/mL

## 2015-12-30 LAB — BASIC METABOLIC PANEL
BUN: 9 mg/dL (ref 4–21)
Creatinine: 0.5 mg/dL (ref 0.5–1.1)
GLUCOSE: 103 mg/dL
Potassium: 3.6 mmol/L (ref 3.4–5.3)
SODIUM: 142 mmol/L (ref 137–147)

## 2015-12-30 LAB — HEMOGLOBIN A1C: HEMOGLOBIN A1C: 5.5

## 2016-01-09 ENCOUNTER — Encounter (HOSPITAL_BASED_OUTPATIENT_CLINIC_OR_DEPARTMENT_OTHER): Payer: No Typology Code available for payment source | Attending: Internal Medicine

## 2016-01-09 DIAGNOSIS — L89154 Pressure ulcer of sacral region, stage 4: Secondary | ICD-10-CM | POA: Insufficient documentation

## 2016-01-09 DIAGNOSIS — Z87891 Personal history of nicotine dependence: Secondary | ICD-10-CM | POA: Diagnosis not present

## 2016-01-09 DIAGNOSIS — M1990 Unspecified osteoarthritis, unspecified site: Secondary | ICD-10-CM | POA: Insufficient documentation

## 2016-01-09 DIAGNOSIS — E43 Unspecified severe protein-calorie malnutrition: Secondary | ICD-10-CM | POA: Insufficient documentation

## 2016-01-09 DIAGNOSIS — E1122 Type 2 diabetes mellitus with diabetic chronic kidney disease: Secondary | ICD-10-CM | POA: Insufficient documentation

## 2016-01-09 DIAGNOSIS — I129 Hypertensive chronic kidney disease with stage 1 through stage 4 chronic kidney disease, or unspecified chronic kidney disease: Secondary | ICD-10-CM | POA: Insufficient documentation

## 2016-01-09 DIAGNOSIS — I2510 Atherosclerotic heart disease of native coronary artery without angina pectoris: Secondary | ICD-10-CM | POA: Diagnosis not present

## 2016-01-09 DIAGNOSIS — D631 Anemia in chronic kidney disease: Secondary | ICD-10-CM | POA: Diagnosis not present

## 2016-01-09 DIAGNOSIS — I739 Peripheral vascular disease, unspecified: Secondary | ICD-10-CM | POA: Diagnosis not present

## 2016-01-09 DIAGNOSIS — I350 Nonrheumatic aortic (valve) stenosis: Secondary | ICD-10-CM | POA: Diagnosis not present

## 2016-01-09 DIAGNOSIS — G309 Alzheimer's disease, unspecified: Secondary | ICD-10-CM | POA: Diagnosis not present

## 2016-01-09 DIAGNOSIS — N189 Chronic kidney disease, unspecified: Secondary | ICD-10-CM | POA: Insufficient documentation

## 2016-01-13 LAB — CBC AND DIFFERENTIAL
HEMATOCRIT: 31 % — AB (ref 36–46)
HEMOGLOBIN: 8.6 g/dL — AB (ref 12.0–16.0)
Neutrophils Absolute: 3 /uL
PLATELETS: 289 10*3/uL (ref 150–399)
WBC: 6.1 10*3/mL

## 2016-01-17 ENCOUNTER — Non-Acute Institutional Stay (SKILLED_NURSING_FACILITY): Payer: Medicare Other | Admitting: Adult Health

## 2016-01-17 ENCOUNTER — Encounter: Payer: Self-pay | Admitting: Adult Health

## 2016-01-17 DIAGNOSIS — L89154 Pressure ulcer of sacral region, stage 4: Secondary | ICD-10-CM | POA: Diagnosis not present

## 2016-01-17 DIAGNOSIS — F0281 Dementia in other diseases classified elsewhere with behavioral disturbance: Secondary | ICD-10-CM | POA: Diagnosis not present

## 2016-01-17 DIAGNOSIS — D62 Acute posthemorrhagic anemia: Secondary | ICD-10-CM | POA: Diagnosis not present

## 2016-01-17 DIAGNOSIS — M4628 Osteomyelitis of vertebra, sacral and sacrococcygeal region: Secondary | ICD-10-CM | POA: Diagnosis not present

## 2016-01-17 DIAGNOSIS — E119 Type 2 diabetes mellitus without complications: Secondary | ICD-10-CM | POA: Diagnosis not present

## 2016-01-17 DIAGNOSIS — G308 Other Alzheimer's disease: Secondary | ICD-10-CM

## 2016-01-17 DIAGNOSIS — F02818 Dementia in other diseases classified elsewhere, unspecified severity, with other behavioral disturbance: Secondary | ICD-10-CM

## 2016-01-17 DIAGNOSIS — E785 Hyperlipidemia, unspecified: Secondary | ICD-10-CM

## 2016-01-17 DIAGNOSIS — L8994 Pressure ulcer of unspecified site, stage 4: Secondary | ICD-10-CM | POA: Diagnosis not present

## 2016-01-17 DIAGNOSIS — E43 Unspecified severe protein-calorie malnutrition: Secondary | ICD-10-CM | POA: Diagnosis not present

## 2016-01-17 DIAGNOSIS — I10 Essential (primary) hypertension: Secondary | ICD-10-CM | POA: Diagnosis not present

## 2016-01-17 NOTE — Progress Notes (Signed)
Patient ID: [name], female   DOB: [birth year], 82 y.o.   MRN: [id number]    DATE:  01/17/16  MRN:  [id number]  BIRTHDAY: [birth year]  Facility:  Nursing Home Location:  [facility]  Nursing Home Room Number: 203-2  LEVEL OF CARE:  SNF [phone])  Contact Information    Name Relation Home Work Sargent Daughter [phone]  [phone]   Chevis Pretty   [phone]   Steva Colder   [phone]       Code Status History    Date Active Date Inactive Code Status Order ID Comments User Context   12/19/2015  9:32 PM 12/21/2015  4:43 AM Full Code [id number]  Bobette Mo, MD Inpatient   10/14/2015  8:50 PM 10/21/2015  4:06 PM Full Code [id number]  Briscoe Deutscher, MD ED   05/17/2015  7:27 PM 05/24/2015  5:49 PM Full Code [id number]  Jeralyn Bennett, MD Inpatient   11/23/2013  8:36 PM 11/24/2013 11:52 PM Full Code [id number]  Martha Clan, MD Inpatient       Chief Complaint  Patient presents with  . Medical Management of Chronic Issues    HISTORY OF PRESENT ILLNESS:  This is an 82 year old female who is being seen for a routine visit. She continues to get Rocephin 1 gm daily for osteomyelitis via RUE SL PICC. Latest hgb is 8.6 and is currently on FeSO4. Her mood is stable and continues to get Remeron for depression.  PAST MEDICAL HISTORY:  Past Medical History  Diagnosis Date  . DM type 2 (diabetes mellitus, type 2) (HCC)   . Hyperlipidemia   . HTN (hypertension)   . CAD (coronary artery disease)     s/p NSTEMI 2003 with stent placement OM1.  PTCA with cutting balloon prox LAD. 2005 admission with chest pain, BMS placed to CFX and DES placed in RCA  . Aortic stenosis     moderate to severe  . Anemia of chronic disease     Symptomatic  . CRI (chronic renal insufficiency)   . CVA (cerebral infarction)     remote; recovered  . Second degree Mobitz II AV block     s/p PPM 12/2009  . Hx of colonoscopy   . Headache 2003  . GERD  (gastroesophageal reflux disease)   . Alzheimer's disease 07/2011  . PAD (peripheral artery disease) (HCC)   . Atherosclerosis   . Osteoporosis   . Fall (on)(from) incline, initial encounter   . Atherosclerosis of artery   . Pressure ulcer   . UTI (lower urinary tract infection)   . Thrombocytopenia (HCC)   . Hypernatremia   . Generalized weakness   . Right arm pain   . Protein calorie malnutrition (HCC)   . Chronic constipation   . Stage 4 decubitus ulcer (HCC)     Positive for E. coli     CURRENT MEDICATIONS: Reviewed  Patient's Medications  New Prescriptions   No medications on file  Previous Medications   ACETAMINOPHEN (TYLENOL) 325 MG TABLET    Take 650 mg by mouth every 4 (four) hours as needed for mild pain or moderate pain (For pain or temperature greater than 99.5 orally).    ACETAMINOPHEN (TYLENOL) 325 MG TABLET    Take 650 mg by mouth daily at 12 noon. Give at noon   ASPIRIN EC 81 MG TABLET    Take 81 mg by mouth daily.   ATORVASTATIN (LIPITOR) 40 MG TABLET    Take  40 mg by mouth daily.    CALCIUM-VITAMIN D (OSCAL WITH D) 500-200 MG-UNIT TABLET    Take 1 tablet by mouth 2 (two) times daily.   CEFTRIAXONE (ROCEPHIN) 1 G INJECTION    Inject 1 g into the vein daily.   DICLOFENAC SODIUM (VOLTAREN) 1 % GEL    Apply 2 g topically 4 (four) times daily as needed (pain). Reported on 12/19/2015   DONEPEZIL (ARICEPT ODT) 10 MG DISINTEGRATING TABLET    Take 10 mg by mouth at bedtime.   FERROUS SULFATE 325 (65 FE) MG TABLET    Take 325 mg by mouth 2 (two) times daily.   HYDROCODONE-ACETAMINOPHEN (NORCO/VICODIN) 5-325 MG TABLET    Take one tablet by mouth every 8 hours as needed for moderate pain. Do not exceed 4gm of Tylenol in 24 hours   HYDROCODONE-ACETAMINOPHEN (NORCO/VICODIN) 5-325 MG TABLET    Take 1 tablet by mouth 2 (two) times daily. Take at 6AM and 6PM   LAVENDER OIL OIL    S/O- INSOMINIA /AGITATION/ RESTLESSNESS/ ANXIETY- Apply a drop of lavendar oil or [place] Oil behind  each ear per standing order. Repeat PRN. Discontinue use if skin irritation occurs.   MIRTAZAPINE (REMERON) 15 MG TABLET    Take 15 mg by mouth at bedtime.   MULTIPLE VITAMINS-MINERALS (DECUBI-VITE) CAPS    Take 1 capsule by mouth daily.   NUTRITIONAL SUPPLEMENTS (NUTRITIONAL SHAKE PO)    Take by mouth 2 (two) times daily. Start Mighty Shake BID with MedPass (2PM, 8PM)   POLYETHYLENE GLYCOL (MIRALAX / GLYCOLAX) PACKET    Take 17 g by mouth 2 (two) times daily.   PROTEIN (PROCEL 100) POWD    Take 2 scoop by mouth 2 (two) times daily.   SACCHAROMYCES BOULARDII (FLORASTOR) 250 MG CAPSULE    Take 250 mg by mouth 2 (two) times daily. Take for one week   SENNA-DOCUSATE (SENNA S) 8.6-50 MG TABLET    Take 2 tablets by mouth 2 (two) times daily.   UNABLE TO FIND    Med Name: Med pass 90 mL 3 times daily  Modified Medications   No medications on file  Discontinued Medications   No medications on file     Allergies  Allergen Reactions  . Latex Rash     REVIEW OF SYSTEMS:  Unable to obtain due to advanced dementia   PHYSICAL EXAMINATION  GENERAL APPEARANCE: Well nourished. In no acute distress. Obese SKIN:  Has gluteal cleft pressure ulcer stage 4 HEAD: Normal in size and contour. No evidence of trauma EYES: Lids open and close normally. No blepharitis, entropion or ectropion. PERRL. Conjunctivae are clear and sclerae are white. Lenses are without opacity EARS: Pinnae are normal. Patient hears normal voice tunes of the examiner MOUTH and THROAT: Lips are without lesions. Oral mucosa is moist and without lesions. Tongue is normal in shape, size, and color and without lesions NECK: supple, trachea midline, no neck masses, no thyroid tenderness, no thyromegaly LYMPHATICS: no LAN in the neck, no supraclavicular LAN RESPIRATORY: breathing is even & unlabored, BS CTAB CARDIAC: RRR, + murmur,no extra heart sounds, no edema GI: abdomen soft, normal BS, no masses, no tenderness, no hepatomegaly, no  splenomegaly EXTREMITIES:  Able to move BUE, unable to move BLE; Right upper arm SL PICC PSYCHIATRIC: Alert to self, disoriented to time and place. Affect and behavior are appropriate  LABS/RADIOLOGY: Labs reviewed: Basic Metabolic Panel:  Recent Labs  16/10/96 0428  10/19/15 0747 10/20/15 0648  12/19/15 1644 12/19/15  2010 12/24/15 12/30/15  NA 144  < > 141 142  < > 143  --  143 142  K 3.0*  < > 4.0 3.9  < > 3.4*  --  3.6 3.6  CL 108  < > 113* 109  --  107  --   --   --   CO2 28  < > 22 26  --  26  --   --   --   GLUCOSE 138*  < > 182* 124*  --  154*  --   --   --   BUN 39*  < > 17 13  < > 13  --  10 9  CREATININE 1.34*  < > 0.77 0.72  < > 0.79  --  0.6 0.5  CALCIUM 9.7  < > 9.2 9.7  --  9.6  --   --   --   MG 1.8  --   --   --   --   --  1.9  --   --   PHOS  --   --   --   --   --   --  2.2*  --   --   < > = values in this interval not displayed. Liver Function Tests:  Recent Labs  10/14/15 1529 10/15/15 0649 12/13/15 12/19/15 1644 12/30/15  AST 65* 51* 20 24 19   ALT 59* 48 11 12* 11  ALKPHOS 89 72 84 86 86  BILITOT 0.8 0.7  --  0.5  --   PROT 7.2 6.3*  --  7.0  --   ALBUMIN 2.7* 2.3*  --  1.9*  --    CBC:  Recent Labs  10/20/15 0648  12/19/15 1644 12/20/15 1334 12/24/15 12/30/15 01/13/16  WBC 6.3  < > 11.1* 9.8 7.8 6.7 6.1  NEUTROABS  --   < > 7.6  --   --  4 3  HGB 10.9*  < > 7.5* 10.1* 8.9* 9.5* 8.6*  HCT 35.1*  < > 26.1* 33.5* 32* 34* 31*  MCV 78.5  --  70.7* 74.6*  --   --   --   PLT 144*  < > 430* 348 343 337 289  < > = values in this interval not displayed.  Cardiac Enzymes:  Recent Labs  05/17/15 1306  TROPONINI 0.08*   BNP: Invalid input(s): POCBNP CBG:  Recent Labs  12/20/15 0552 12/20/15 1147 12/20/15 1707  GLUCAP 86 109* 80       Dg Chest 1 View  12/19/2015  CLINICAL DATA:  Altered mental status EXAM: CHEST 1 VIEW COMPARISON:  10/14/2015 FINDINGS: Right upper extremity PICC has been placed with its tip at the cavoatrial  junction. The heart is upper normal in size. Dual lead left subclavian pacemaker device and leads are stable and intact. Lungs are under aerated and grossly clear. No pneumothorax. IMPRESSION: No active cardiopulmonary disease. Electronically Signed   By: Jolaine Click M.D.   On: 12/19/2015 16:41    ASSESSMENT/PLAN:  Anemia, acute blood loss -   hemoglobin 8.6; continue ferrous sulfate 325 mg 1 tab by mouth twice a day  Escherichia coli osteomyelitis - continue Rocephin 1 g IV daily  Constipation - continue MiraLAX 17 g by mouth twice a day and senna S2 tabs by mouth twice a day  Protein calorie malnutrition, severe -  continue Procel 2 scoops by mouth twice a day and med Pass 90 mL by mouth 3 times a day  Alzheimer's dementia - continue Aricept 10 mg by mouth daily at bedtime  Hyperlipidemia - continue Lipitor 40 mg 1 tab by mouth daily  Diabetes mellitus, type II - diet controlled; hgbA1c 5.5  Depression - mood is stable; continue Remeron 15 mg by mouth daily at bedtime  Sacral pressure ulcer - continue daily wound treatment and air mattress    Goals of care:  Long-term care    Kenard GowerMonina Medina-Vargas, NP [facility] [phone]

## 2016-01-22 LAB — CBC AND DIFFERENTIAL
HCT: 30 % — AB (ref 36–46)
Hemoglobin: 8.5 g/dL — AB (ref 12.0–16.0)
NEUTROS ABS: 4 /uL
PLATELETS: 270 10*3/uL (ref 150–399)
WBC: 7.2 10^3/mL

## 2016-01-23 DIAGNOSIS — L89154 Pressure ulcer of sacral region, stage 4: Secondary | ICD-10-CM | POA: Diagnosis not present

## 2016-01-31 LAB — CBC AND DIFFERENTIAL
HCT: 32 % — AB (ref 36–46)
HEMOGLOBIN: 8.9 g/dL — AB (ref 12.0–16.0)
Neutrophils Absolute: 6 /uL
Platelets: 301 10*3/uL (ref 150–399)
WBC: 9.4 10*3/mL

## 2016-02-06 ENCOUNTER — Encounter (HOSPITAL_BASED_OUTPATIENT_CLINIC_OR_DEPARTMENT_OTHER): Payer: Medicare Other | Attending: Internal Medicine

## 2016-02-06 DIAGNOSIS — L89154 Pressure ulcer of sacral region, stage 4: Secondary | ICD-10-CM | POA: Diagnosis present

## 2016-02-06 DIAGNOSIS — E43 Unspecified severe protein-calorie malnutrition: Secondary | ICD-10-CM | POA: Diagnosis not present

## 2016-02-06 DIAGNOSIS — I10 Essential (primary) hypertension: Secondary | ICD-10-CM | POA: Diagnosis not present

## 2016-02-06 DIAGNOSIS — Z87891 Personal history of nicotine dependence: Secondary | ICD-10-CM | POA: Insufficient documentation

## 2016-02-06 DIAGNOSIS — I2510 Atherosclerotic heart disease of native coronary artery without angina pectoris: Secondary | ICD-10-CM | POA: Diagnosis not present

## 2016-02-06 DIAGNOSIS — G309 Alzheimer's disease, unspecified: Secondary | ICD-10-CM | POA: Insufficient documentation

## 2016-02-06 DIAGNOSIS — F0280 Dementia in other diseases classified elsewhere without behavioral disturbance: Secondary | ICD-10-CM | POA: Diagnosis not present

## 2016-02-06 DIAGNOSIS — E1151 Type 2 diabetes mellitus with diabetic peripheral angiopathy without gangrene: Secondary | ICD-10-CM | POA: Diagnosis not present

## 2016-02-11 ENCOUNTER — Encounter: Payer: Self-pay | Admitting: Adult Health

## 2016-02-11 ENCOUNTER — Non-Acute Institutional Stay (SKILLED_NURSING_FACILITY): Payer: Medicare Other | Admitting: Adult Health

## 2016-02-11 DIAGNOSIS — E119 Type 2 diabetes mellitus without complications: Secondary | ICD-10-CM | POA: Diagnosis not present

## 2016-02-11 DIAGNOSIS — G308 Other Alzheimer's disease: Secondary | ICD-10-CM | POA: Diagnosis not present

## 2016-02-11 DIAGNOSIS — M150 Primary generalized (osteo)arthritis: Secondary | ICD-10-CM

## 2016-02-11 DIAGNOSIS — E785 Hyperlipidemia, unspecified: Secondary | ICD-10-CM

## 2016-02-11 DIAGNOSIS — M4628 Osteomyelitis of vertebra, sacral and sacrococcygeal region: Secondary | ICD-10-CM

## 2016-02-11 DIAGNOSIS — L89154 Pressure ulcer of sacral region, stage 4: Secondary | ICD-10-CM

## 2016-02-11 DIAGNOSIS — M8610 Other acute osteomyelitis, unspecified site: Secondary | ICD-10-CM | POA: Diagnosis not present

## 2016-02-11 DIAGNOSIS — K5900 Constipation, unspecified: Secondary | ICD-10-CM | POA: Diagnosis not present

## 2016-02-11 DIAGNOSIS — K5909 Other constipation: Secondary | ICD-10-CM

## 2016-02-11 DIAGNOSIS — D62 Acute posthemorrhagic anemia: Secondary | ICD-10-CM | POA: Diagnosis not present

## 2016-02-11 DIAGNOSIS — F0281 Dementia in other diseases classified elsewhere with behavioral disturbance: Secondary | ICD-10-CM | POA: Diagnosis not present

## 2016-02-11 DIAGNOSIS — F02818 Dementia in other diseases classified elsewhere, unspecified severity, with other behavioral disturbance: Secondary | ICD-10-CM

## 2016-02-11 DIAGNOSIS — E43 Unspecified severe protein-calorie malnutrition: Secondary | ICD-10-CM | POA: Diagnosis not present

## 2016-02-11 DIAGNOSIS — M159 Polyosteoarthritis, unspecified: Secondary | ICD-10-CM

## 2016-02-11 NOTE — Progress Notes (Signed)
Patient ID: [name], female   DOB: 03-25-1930, 82 y.o.   MRN: [id number]    DATE:    02/11/16   MRN:  [id number]  BIRTHDAY: Jun 07, 1930  Facility:  Nursing Home Location:  [facility]  Nursing Home Room Number: 203-B  LEVEL OF CARE:  SNF [phone])  Contact Information    Name Relation Home Work Anton Daughter [phone]  [phone]   Chevis Pretty   [phone]   Steva Colder   [phone]       Code Status History    Date Active Date Inactive Code Status Order ID Comments User Context   12/19/2015  9:32 PM 12/21/2015  4:43 AM Full Code [id number]  Bobette Mo, MD Inpatient   10/14/2015  8:50 PM 10/21/2015  4:06 PM Full Code [id number]  Briscoe Deutscher, MD ED   05/17/2015  7:27 PM 05/24/2015  5:49 PM Full Code [id number]  Jeralyn Bennett, MD Inpatient   11/23/2013  8:36 PM 11/24/2013 11:52 PM Full Code [id number]  Martha Clan, MD Inpatient       Chief Complaint  Patient presents with  . Medical Management of Chronic Issues    HISTORY OF PRESENT ILLNESS:  This is an 82 year old female who is being seen for a routine visit. She is a long-term care resident of [facility]. She has completed Rocephin IV for a total of 6 weeks. Her sacral pressure ulcer continues to have wound vac and wound treatment. Latest hgb 8.9 and continues to take FeSO4 for anemia.    PAST MEDICAL HISTORY:  Past Medical History  Diagnosis Date  . DM type 2 (diabetes mellitus, type 2) (HCC)   . Hyperlipidemia   . HTN (hypertension)   . CAD (coronary artery disease)     s/p NSTEMI 2003 with stent placement OM1.  PTCA with cutting balloon prox LAD. 2005 admission with chest pain, BMS placed to CFX and DES placed in RCA  . Aortic stenosis     moderate to severe  . Anemia of chronic disease     Symptomatic  . CRI (chronic renal insufficiency)   . CVA (cerebral infarction)     remote; recovered  . Second degree Mobitz II AV block     s/p PPM  12/2009  . Hx of colonoscopy   . Headache 2003  . GERD (gastroesophageal reflux disease)   . Alzheimer's disease 07/2011  . PAD (peripheral artery disease) (HCC)   . Atherosclerosis   . Osteoporosis   . Fall (on)(from) incline, initial encounter   . Atherosclerosis of artery   . Pressure ulcer   . UTI (lower urinary tract infection)   . Thrombocytopenia (HCC)   . Hypernatremia   . Generalized weakness   . Right arm pain   . Protein calorie malnutrition (HCC)   . Chronic constipation   . Stage 4 decubitus ulcer (HCC)     Positive for E. coli     CURRENT MEDICATIONS: Reviewed  Patient's Medications  New Prescriptions   No medications on file  Previous Medications   ACETAMINOPHEN (TYLENOL) 325 MG TABLET    Take 650 mg by mouth every 4 (four) hours as needed for mild pain or moderate pain (For pain or temperature greater than 99.5 orally).    ACETAMINOPHEN (TYLENOL) 325 MG TABLET    Take 650 mg by mouth daily at 12 noon. Give at noon   ASPIRIN EC 81 MG TABLET    Take 81 mg by  mouth daily.   ATORVASTATIN (LIPITOR) 40 MG TABLET    Take 40 mg by mouth daily.    CALCIUM-VITAMIN D (OSCAL WITH D) 500-200 MG-UNIT TABLET    Take 1 tablet by mouth 2 (two) times daily.   CEFTRIAXONE (ROCEPHIN) 1 G INJECTION    Inject 1 g into the vein daily.   DICLOFENAC SODIUM (VOLTAREN) 1 % GEL    Apply 2 g topically 4 (four) times daily as needed (pain). Reported on 12/19/2015   DONEPEZIL (ARICEPT ODT) 10 MG DISINTEGRATING TABLET    Take 10 mg by mouth at bedtime.   FERROUS SULFATE 325 (65 FE) MG TABLET    Take 325 mg by mouth 2 (two) times daily.   HYDROCODONE-ACETAMINOPHEN (NORCO/VICODIN) 5-325 MG TABLET    Take one tablet by mouth every 8 hours as needed for moderate pain. Do not exceed 4gm of Tylenol in 24 hours   HYDROCODONE-ACETAMINOPHEN (NORCO/VICODIN) 5-325 MG TABLET    Take 1 tablet by mouth 2 (two) times daily. Take at 6AM and 6PM   LAVENDER OIL OIL    S/O- INSOMINIA /AGITATION/ RESTLESSNESS/  ANXIETY- Apply a drop of lavendar oil or [place] Oil behind each ear per standing order. Repeat PRN. Discontinue use if skin irritation occurs.   MIRTAZAPINE (REMERON) 15 MG TABLET    Take 15 mg by mouth at bedtime.   MULTIPLE VITAMINS-MINERALS (DECUBI-VITE) CAPS    Take 1 capsule by mouth daily.   NUTRITIONAL SUPPLEMENTS (NUTRITIONAL SHAKE PO)    Take by mouth 2 (two) times daily. Start Mighty Shake BID with MedPass (2PM, 8PM)   POLYETHYLENE GLYCOL (MIRALAX / GLYCOLAX) PACKET    Take 17 g by mouth 2 (two) times daily.   PROTEIN (PROCEL 100) POWD    Take 2 scoop by mouth 2 (two) times daily.   SACCHAROMYCES BOULARDII (FLORASTOR) 250 MG CAPSULE    Take 250 mg by mouth 2 (two) times daily. Take for one week   SENNA-DOCUSATE (SENNA S) 8.6-50 MG TABLET    Take 2 tablets by mouth 2 (two) times daily.   UNABLE TO FIND    Med Name: Med pass 90 mL 3 times daily  Modified Medications   No medications on file  Discontinued Medications   No medications on file     Allergies  Allergen Reactions  . Latex Rash     REVIEW OF SYSTEMS:  Unable to obtain due to advanced dementia   PHYSICAL EXAMINATION  GENERAL APPEARANCE: Well nourished. In no acute distress. Obese SKIN:  Has gluteal cleft pressure ulcer stage 4 which has wound vac HEAD: Normal in size and contour. No evidence of trauma EYES: Lids open and close normally. No blepharitis, entropion or ectropion. PERRL. Conjunctivae are clear and sclerae are white. Lenses are without opacity EARS: Pinnae are normal. Patient hears normal voice tunes of the examiner MOUTH and THROAT: Lips are without lesions. Oral mucosa is moist and without lesions. Tongue is normal in shape, size, and color and without lesions NECK: supple, trachea midline, no neck masses, no thyroid tenderness, no thyromegaly LYMPHATICS: no LAN in the neck, no supraclavicular LAN RESPIRATORY: breathing is even & unlabored, BS CTAB CARDIAC: RRR, + murmur,no extra heart sounds, no  edema GI: abdomen soft, normal BS, no masses, no tenderness, no hepatomegaly, no splenomegaly GU:  Has foley catheter attached to urine bag EXTREMITIES:  Able to move BUE, unable to move BLE; Right upper arm SL PICC PSYCHIATRIC: Alert to self, disoriented to time and place. Affect  and behavior are appropriate  LABS/RADIOLOGY: Labs reviewed: Basic Metabolic Panel:  Recent Labs  40/98/1109/23/16 0428  10/19/15 0747 10/20/15 0648  12/19/15 1644 12/19/15 2010 12/24/15 12/30/15  NA 144  < > 141 142  < > 143  --  143 142  K 3.0*  < > 4.0 3.9  < > 3.4*  --  3.6 3.6  CL 108  < > 113* 109  --  107  --   --   --   CO2 28  < > 22 26  --  26  --   --   --   GLUCOSE 138*  < > 182* 124*  --  154*  --   --   --   BUN 39*  < > 17 13  < > 13  --  10 9  CREATININE 1.34*  < > 0.77 0.72  < > 0.79  --  0.6 0.5  CALCIUM 9.7  < > 9.2 9.7  --  9.6  --   --   --   MG 1.8  --   --   --   --   --  1.9  --   --   PHOS  --   --   --   --   --   --  2.2*  --   --   < > = values in this interval not displayed. Liver Function Tests:  Recent Labs  10/14/15 1529 10/15/15 0649 12/13/15 12/19/15 1644 12/30/15  AST 65* 51* 20 24 19   ALT 59* 48 11 12* 11  ALKPHOS 89 72 84 86 86  BILITOT 0.8 0.7  --  0.5  --   PROT 7.2 6.3*  --  7.0  --   ALBUMIN 2.7* 2.3*  --  1.9*  --    CBC:  Recent Labs  10/20/15 0648  12/19/15 1644 12/20/15 1334  01/13/16 01/22/16 01/31/16  WBC 6.3  < > 11.1* 9.8  < > 6.1 7.2 9.4  NEUTROABS  --   < > 7.6  --   < > 3 4 6   HGB 10.9*  < > 7.5* 10.1*  < > 8.6* 8.5* 8.9*  HCT 35.1*  < > 26.1* 33.5*  < > 31* 30* 32*  MCV 78.5  --  70.7* 74.6*  --   --   --   --   PLT 144*  < > 430* 348  < > 289 270 301  < > = values in this interval not displayed.  Cardiac Enzymes:  Recent Labs  05/17/15 1306  TROPONINI 0.08*   CBG:  Recent Labs  12/20/15 0552 12/20/15 1147 12/20/15 1707  GLUCAP 86 109* 80       ASSESSMENT/PLAN:  Anemia, acute blood loss -   hemoglobin 8.9; continue  ferrous sulfate 325 mg 1 tab by mouth twice a day  Escherichia coli osteomyelitis - recently finished Rocephin IV treatment X 6 weeks; discontinue PICC line  Constipation - continue MiraLAX 17 g by mouth twice a day and senna S2 tabs by mouth twice a day  Protein calorie malnutrition, severe -  continue Procel 2 scoops by mouth twice a day and med Pass 90 mL by mouth 3 times a day  Alzheimer's dementia - continue Aricept 10 mg by mouth daily at bedtime  Hyperlipidemia - continue Lipitor 40 mg 1 tab by mouth daily  Diabetes mellitus, type II - diet controlled; hgbA1c 5.5  Depression - mood is stable; continue  Remeron 15 mg by mouth daily at bedtime  Sacral pressure ulcer - continue daily wound treatment and air mattress; continue Norco 5/325 mg 1 tab by mouth BID routine and every 8 hours when necessary and Tylenol 325 mg take 2 tabs= 650 mg by mouth Q 12 noon when necessary for pain and decubivite 1 tab daily and decrease Tylenol 325 mg take 2 tabs PO Q 12 hours PRN  Osteoarthritis  - apply Voltaren 1% gel 2 g to BLE 4 times a day when necessary   Goals of care:  Long-term care    Kenard Gower, NP [facility] [phone]

## 2016-02-27 DIAGNOSIS — L89154 Pressure ulcer of sacral region, stage 4: Secondary | ICD-10-CM | POA: Diagnosis not present

## 2016-03-02 LAB — CBC AND DIFFERENTIAL
HEMATOCRIT: 31 % — AB (ref 36–46)
Hemoglobin: 9.2 g/dL — AB (ref 12.0–16.0)
PLATELETS: 420 10*3/uL — AB (ref 150–399)
WBC: 14.3 10*3/mL

## 2016-03-02 LAB — BASIC METABOLIC PANEL
BUN: 17 mg/dL (ref 4–21)
Creatinine: 0.7 mg/dL (ref 0.5–1.1)
GLUCOSE: 134 mg/dL
Potassium: 3.9 mmol/L (ref 3.4–5.3)
Sodium: 146 mmol/L (ref 137–147)

## 2016-03-02 LAB — HEPATIC FUNCTION PANEL
ALK PHOS: 90 U/L (ref 25–125)
ALT: 15 U/L (ref 7–35)
AST: 33 U/L (ref 13–35)
BILIRUBIN, TOTAL: 0.5 mg/dL

## 2016-03-05 ENCOUNTER — Encounter (HOSPITAL_BASED_OUTPATIENT_CLINIC_OR_DEPARTMENT_OTHER): Payer: No Typology Code available for payment source | Attending: Internal Medicine

## 2016-03-05 DIAGNOSIS — I2510 Atherosclerotic heart disease of native coronary artery without angina pectoris: Secondary | ICD-10-CM | POA: Insufficient documentation

## 2016-03-05 DIAGNOSIS — E119 Type 2 diabetes mellitus without complications: Secondary | ICD-10-CM | POA: Insufficient documentation

## 2016-03-05 DIAGNOSIS — L89154 Pressure ulcer of sacral region, stage 4: Secondary | ICD-10-CM | POA: Insufficient documentation

## 2016-03-05 DIAGNOSIS — M1990 Unspecified osteoarthritis, unspecified site: Secondary | ICD-10-CM | POA: Diagnosis not present

## 2016-03-05 DIAGNOSIS — F0390 Unspecified dementia without behavioral disturbance: Secondary | ICD-10-CM | POA: Diagnosis not present

## 2016-03-05 DIAGNOSIS — I10 Essential (primary) hypertension: Secondary | ICD-10-CM | POA: Insufficient documentation

## 2016-03-05 LAB — BASIC METABOLIC PANEL
BUN: 15 mg/dL (ref 4–21)
Creatinine: 0.6 mg/dL (ref 0.5–1.1)
GLUCOSE: 101 mg/dL
POTASSIUM: 3.5 mmol/L (ref 3.4–5.3)
Sodium: 151 mmol/L — AB (ref 137–147)

## 2016-03-05 LAB — CBC AND DIFFERENTIAL
HCT: 32 % — AB (ref 36–46)
HEMOGLOBIN: 8.9 g/dL — AB (ref 12.0–16.0)
Platelets: 380 10*3/uL (ref 150–399)
WBC: 14.8 10*3/mL

## 2016-03-05 LAB — HEPATIC FUNCTION PANEL
ALT: 12 U/L (ref 7–35)
AST: 21 U/L (ref 13–35)
Alkaline Phosphatase: 90 U/L (ref 25–125)
BILIRUBIN, TOTAL: 0.4 mg/dL

## 2016-03-06 ENCOUNTER — Encounter: Payer: Self-pay | Admitting: Adult Health

## 2016-03-06 ENCOUNTER — Non-Acute Institutional Stay (SKILLED_NURSING_FACILITY): Payer: Medicare Other | Admitting: Adult Health

## 2016-03-06 DIAGNOSIS — E870 Hyperosmolality and hypernatremia: Secondary | ICD-10-CM

## 2016-03-06 DIAGNOSIS — D72829 Elevated white blood cell count, unspecified: Secondary | ICD-10-CM

## 2016-03-06 NOTE — Progress Notes (Signed)
Patient ID: [name], female   DOB: 22-Aug-1930, 82 y.o.   MRN: [id number]    DATE:    03/06/16  MRN:  [id number]  BIRTHDAY: October 16, 1929  Facility:  Nursing Home Location:  [facility]  Nursing Home Room Number: 203-B  LEVEL OF CARE:  SNF [phone])  Contact Information    Name Relation Home Work [place] Daughter [phone]  [phone]   Chevis Pretty   [phone]   Steva Colder   [phone]       Code Status History    Date Active Date Inactive Code Status Order ID Comments User Context   12/19/2015  9:32 PM 12/21/2015  4:43 AM Full Code [id number]  Bobette Mo, MD Inpatient   10/14/2015  8:50 PM 10/21/2015  4:06 PM Full Code [id number]  Briscoe Deutscher, MD ED   05/17/2015  7:27 PM 05/24/2015  5:49 PM Full Code [id number]  Jeralyn Bennett, MD Inpatient   11/23/2013  8:36 PM 11/24/2013 11:52 PM Full Code [id number]  Martha Clan, MD Inpatient       Chief Complaint  Patient presents with  . Acute Visit    Hypernatremia    HISTORY OF PRESENT ILLNESS:  This is an 82 year old female who was noted to have NA 151. She has poor appetite. She answers to her name when called. Daughter is going to talk with Palliative Care NP today and discuss plan of care. Patient is currently on Zyvox for pressure ulcer infection.  PAST MEDICAL HISTORY:  Past Medical History  Diagnosis Date  . DM type 2 (diabetes mellitus, type 2) (HCC)   . Hyperlipidemia   . HTN (hypertension)   . CAD (coronary artery disease)     s/p NSTEMI 2003 with stent placement OM1.  PTCA with cutting balloon prox LAD. 2005 admission with chest pain, BMS placed to CFX and DES placed in RCA  . Aortic stenosis     moderate to severe  . Anemia of chronic disease     Symptomatic  . CRI (chronic renal insufficiency)   . CVA (cerebral infarction)     remote; recovered  . Second degree Mobitz II AV block     s/p PPM 12/2009  . Hx of colonoscopy   . Headache 2003  . GERD  (gastroesophageal reflux disease)   . Alzheimer's disease 07/2011  . PAD (peripheral artery disease) (HCC)   . Atherosclerosis   . Osteoporosis   . Fall (on)(from) incline, initial encounter   . Atherosclerosis of artery   . Pressure ulcer   . UTI (lower urinary tract infection)   . Thrombocytopenia (HCC)   . Hypernatremia   . Generalized weakness   . Right arm pain   . Protein calorie malnutrition (HCC)   . Chronic constipation   . Stage 4 decubitus ulcer (HCC)     Positive for E. coli     CURRENT MEDICATIONS: Reviewed  Patient's Medications  New Prescriptions   No medications on file  Previous Medications   ACETAMINOPHEN (TYLENOL) 500 MG TABLET    Take 500 mg by mouth every 6 (six) hours. Take po or rectally   ASPIRIN EC 81 MG TABLET    Take 81 mg by mouth daily.   ATORVASTATIN (LIPITOR) 10 MG TABLET    Take 10-20 mg by mouth daily. 20 mg x3 doses, then 10 mg x2 doses, then stop   BISACODYL (DULCOLAX) 10 MG SUPPOSITORY    Place 10 mg rectally as needed  for moderate constipation. q3days if no BM   DEXTROSE-SODIUM CHLORIDE (DEXTROSE 5 % AND 0.45% NACL) INFUSION    Inject 80 mLs into the vein once. Give 1L at 80 mL/Hr via clysis   HYDROCODONE-ACETAMINOPHEN (NORCO/VICODIN) 5-325 MG TABLET    Take one tablet by mouth every 8 hours as needed for moderate pain. Do not exceed 4gm of Tylenol in 24 hours   HYDROCODONE-ACETAMINOPHEN (NORCO/VICODIN) 5-325 MG TABLET    Take 1 tablet by mouth 2 (two) times daily. Take at 6AM and 6PM   LAVENDER OIL OIL    S/O- INSOMINIA /AGITATION/ RESTLESSNESS/ ANXIETY- Apply a drop of lavendar oil or [place] Oil behind each ear per standing order. Repeat PRN. Discontinue use if skin irritation occurs.   LINEZOLID (ZYVOX) 600 MG TABLET    Take 600 mg by mouth 2 (two) times daily.   MIRTAZAPINE (REMERON) 15 MG TABLET    Take 15 mg by mouth daily. Take at 12 PM   MULTIPLE VITAMINS-MINERALS (DECUBI-VITE) CAPS    Take 1 capsule by mouth daily.   NUTRITIONAL  SUPPLEMENTS (NUTRITIONAL SHAKE PO)    Take by mouth 2 (two) times daily. Start Mighty Shake BID with MedPass (2PM, 8PM)   PROTEIN (PROCEL 100) POWD    Take 2 scoop by mouth 2 (two) times daily.   SACCHAROMYCES BOULARDII (FLORASTOR) 250 MG CAPSULE    Take 250 mg by mouth 2 (two) times daily. Take for one week   SENNA-DOCUSATE (SENNA S) 8.6-50 MG TABLET    Take 2 tablets by mouth 2 (two) times daily.   UNABLE TO FIND    Med Name: Med pass 90 mL 3 times daily  Modified Medications   No medications on file  Discontinued Medications   ACETAMINOPHEN (TYLENOL) 325 MG TABLET    Take 650 mg by mouth every 4 (four) hours as needed for mild pain or moderate pain (For pain or temperature greater than 99.5 orally).    ACETAMINOPHEN (TYLENOL) 325 MG TABLET    Take 650 mg by mouth daily at 12 noon. Give at noon   ATORVASTATIN (LIPITOR) 40 MG TABLET    Take 40 mg by mouth daily.    CALCIUM-VITAMIN D (OSCAL WITH D) 500-200 MG-UNIT TABLET    Take 1 tablet by mouth 2 (two) times daily.   CEFTRIAXONE (ROCEPHIN) 1 G INJECTION    Inject 1 g into the vein daily.   DICLOFENAC SODIUM (VOLTAREN) 1 % GEL    Apply 2 g topically 4 (four) times daily as needed (pain). Reported on 12/19/2015   DONEPEZIL (ARICEPT ODT) 10 MG DISINTEGRATING TABLET    Take 10 mg by mouth at bedtime.   FERROUS SULFATE 325 (65 FE) MG TABLET    Take 325 mg by mouth 2 (two) times daily.   POLYETHYLENE GLYCOL (MIRALAX / GLYCOLAX) PACKET    Take 17 g by mouth 2 (two) times daily.     Allergies  Allergen Reactions  . Latex Rash     REVIEW OF SYSTEMS:  Unable to obtain due to advanced dementia   PHYSICAL EXAMINATION  GENERAL APPEARANCE: Well nourished. In no acute distress. Obese SKIN:  Has gluteal cleft pressure ulcer stage 4  Covered with dressing HEAD: Normal in size and contour. No evidence of trauma EYES: Lids open and close normally. No blepharitis, entropion or ectropion. PERRL. Conjunctivae are clear and sclerae are white. Lenses are  without opacity EARS: Pinnae are normal. Patient hears normal voice tunes of the examiner MOUTH and THROAT:  Lips are without lesions. Oral mucosa is moist and without lesions. Tongue is normal in shape, size, and color and without lesions NECK: supple, trachea midline, no neck masses, no thyroid tenderness, no thyromegaly LYMPHATICS: no LAN in the neck, no supraclavicular LAN RESPIRATORY: breathing is even & unlabored, BS CTAB CARDIAC: RRR, + murmur,no extra heart sounds, no edema GI: abdomen soft, normal BS, no masses, no tenderness, no hepatomegaly, no splenomegaly GU:  Has foley catheter attached to urine bag EXTREMITIES:  Able to move BUE, unable to move BLE; Right upper arm SL PICC PSYCHIATRIC: Alert to self, disoriented to time and place. Affect and behavior are appropriate  LABS/RADIOLOGY: Labs reviewed: Basic Metabolic Panel:  Recent Labs  28/41/3209/23/16 0428  10/19/15 0747 10/20/15 0648  12/19/15 1644 12/19/15 2010 12/24/15 12/30/15 03/05/16  NA 144  < > 141 142  < > 143  --  143 142 151*  K 3.0*  < > 4.0 3.9  < > 3.4*  --  3.6 3.6 3.5  CL 108  < > 113* 109  --  107  --   --   --   --   CO2 28  < > 22 26  --  26  --   --   --   --   GLUCOSE 138*  < > 182* 124*  --  154*  --   --   --   --   BUN 39*  < > 17 13  < > 13  --  10 9 15   CREATININE 1.34*  < > 0.77 0.72  < > 0.79  --  0.6 0.5 0.6  CALCIUM 9.7  < > 9.2 9.7  --  9.6  --   --   --   --   MG 1.8  --   --   --   --   --  1.9  --   --   --   PHOS  --   --   --   --   --   --  2.2*  --   --   --   < > = values in this interval not displayed. Liver Function Tests:  Recent Labs  10/14/15 1529 10/15/15 0649  12/19/15 1644 12/30/15 03/05/16  AST 65* 51*  < > 24 19 21   ALT 59* 48  < > 12* 11 12  ALKPHOS 89 72  < > 86 86 90  BILITOT 0.8 0.7  --  0.5  --   --   PROT 7.2 6.3*  --  7.0  --   --   ALBUMIN 2.7* 2.3*  --  1.9*  --   --   < > = values in this interval not displayed. CBC:  Recent Labs  10/20/15 0648   12/19/15 1644 12/20/15 1334  01/13/16 01/22/16 01/31/16 03/05/16  WBC 6.3  < > 11.1* 9.8  < > 6.1 7.2 9.4 14.8  NEUTROABS  --   < > 7.6  --   < > 3 4 6   --   HGB 10.9*  < > 7.5* 10.1*  < > 8.6* 8.5* 8.9* 8.9*  HCT 35.1*  < > 26.1* 33.5*  < > 31* 30* 32* 32*  MCV 78.5  --  70.7* 74.6*  --   --   --   --   --   PLT 144*  < > 430* 348  < > 289 270 301 380  < > = values in this  interval not displayed.  Cardiac Enzymes:  Recent Labs  05/17/15 1306  TROPONINI 0.08*   CBG:  Recent Labs  12/20/15 0552 12/20/15 1147 12/20/15 1707  GLUCAP 86 109* 80       ASSESSMENT/PLAN:  Hypernatremia - start D5 1/2 NS @ 80 ml via clysis X 1L then BMP after  Leukocytosis - wbc 14.8 ; continue Zyvox; repeat CBC     Kenard GowerMonina Medina-Vargas, NP [facility] [phone]

## 2016-03-10 ENCOUNTER — Non-Acute Institutional Stay (SKILLED_NURSING_FACILITY): Payer: Medicare Other | Admitting: Adult Health

## 2016-03-10 ENCOUNTER — Encounter: Payer: Self-pay | Admitting: Adult Health

## 2016-03-10 DIAGNOSIS — E870 Hyperosmolality and hypernatremia: Secondary | ICD-10-CM | POA: Diagnosis not present

## 2016-03-10 DIAGNOSIS — A4902 Methicillin resistant Staphylococcus aureus infection, unspecified site: Secondary | ICD-10-CM | POA: Diagnosis not present

## 2016-03-10 DIAGNOSIS — K5900 Constipation, unspecified: Secondary | ICD-10-CM

## 2016-03-10 DIAGNOSIS — F329 Major depressive disorder, single episode, unspecified: Secondary | ICD-10-CM

## 2016-03-10 DIAGNOSIS — F32A Depression, unspecified: Secondary | ICD-10-CM

## 2016-03-10 DIAGNOSIS — F0281 Dementia in other diseases classified elsewhere with behavioral disturbance: Secondary | ICD-10-CM

## 2016-03-10 DIAGNOSIS — G308 Other Alzheimer's disease: Secondary | ICD-10-CM | POA: Diagnosis not present

## 2016-03-10 DIAGNOSIS — E119 Type 2 diabetes mellitus without complications: Secondary | ICD-10-CM | POA: Diagnosis not present

## 2016-03-10 DIAGNOSIS — K5909 Other constipation: Secondary | ICD-10-CM

## 2016-03-10 DIAGNOSIS — E43 Unspecified severe protein-calorie malnutrition: Secondary | ICD-10-CM

## 2016-03-10 DIAGNOSIS — E785 Hyperlipidemia, unspecified: Secondary | ICD-10-CM | POA: Diagnosis not present

## 2016-03-10 DIAGNOSIS — D62 Acute posthemorrhagic anemia: Secondary | ICD-10-CM | POA: Diagnosis not present

## 2016-03-10 NOTE — Progress Notes (Signed)
Patient ID: [name], female   DOB: [birth year], 82 y.o.   MRN: [id number]     DATE:      03/10/16  MRN:  [id number]  BIRTHDAY: [birth year]  Facility:  Nursing Home Location:  [facility]  Nursing Home Room Number: 203-B  LEVEL OF CARE:  SNF [phone])  Contact Information    Name Relation Home Work [place] Daughter [phone]  [phone]   Chevis Pretty   [phone]   Steva Colder   [phone]       Code Status History    Date Active Date Inactive Code Status Order ID Comments User Context   12/19/2015  9:32 PM 12/21/2015  4:43 AM Full Code [id number]  Bobette Mo, MD Inpatient   10/14/2015  8:50 PM 10/21/2015  4:06 PM Full Code [id number]  Briscoe Deutscher, MD ED   05/17/2015  7:27 PM 05/24/2015  5:49 PM Full Code [id number]  Jeralyn Bennett, MD Inpatient   11/23/2013  8:36 PM 11/24/2013 11:52 PM Full Code [id number]  Martha Clan, MD Inpatient       Chief Complaint  Patient presents with  . Medical Management of Chronic Issues    HISTORY OF PRESENT ILLNESS:  This is an 82 year old female who is being seen for a routine visit. She is a long-term care resident of [facility]. She was noted to have Na 153. She has poor PO intake. She was recently admitted to hospice however, she is still full code. Family will have conference with hospice to determine plan of care. No peg tube was ordered. Daughter, [name], was called and asked if she wants patient to have more IV fluids for the hypernatremia. She said that whatever the patient needs, give it to her and that they will have a meeting tomorrow with the rest of the family. She is currently on Zyvox for MRSA of wound.   PAST MEDICAL HISTORY:  Past Medical History  Diagnosis Date  . DM type 2 (diabetes mellitus, type 2) (HCC)   . Hyperlipidemia   . HTN (hypertension)   . CAD (coronary artery disease)     s/p NSTEMI 2003 with stent placement OM1.  PTCA with cutting  balloon prox LAD. 2005 admission with chest pain, BMS placed to CFX and DES placed in RCA  . Aortic stenosis     moderate to severe  . Anemia of chronic disease     Symptomatic  . CRI (chronic renal insufficiency)   . CVA (cerebral infarction)     remote; recovered  . Second degree Mobitz II AV block     s/p PPM 12/2009  . Hx of colonoscopy   . Headache 2003  . GERD (gastroesophageal reflux disease)   . Alzheimer's disease 07/2011  . PAD (peripheral artery disease) (HCC)   . Atherosclerosis   . Osteoporosis   . Fall (on)(from) incline, initial encounter   . Atherosclerosis of artery   . Pressure ulcer   . UTI (lower urinary tract infection)   . Thrombocytopenia (HCC)   . Hypernatremia   . Generalized weakness   . Right arm pain   . Protein calorie malnutrition (HCC)   . Chronic constipation   . Stage 4 decubitus ulcer (HCC)     Positive for E. coli     CURRENT MEDICATIONS: Reviewed  Patient's Medications  New Prescriptions   No medications on file  Previous Medications   ACETAMINOPHEN (TYLENOL) 500 MG TABLET    Take  500 mg by mouth every 6 (six) hours. Take po or rectally   ASPIRIN EC 81 MG TABLET    Take 81 mg by mouth daily.   ATORVASTATIN (LIPITOR) 10 MG TABLET    Take 10 mg by mouth daily. Give x2 days and then discontinue   BISACODYL (DULCOLAX) 10 MG SUPPOSITORY    Place 10 mg rectally every 3 (three) days. q3days if no BM   HYDROCODONE-ACETAMINOPHEN (NORCO/VICODIN) 5-325 MG TABLET    Take 1 tablet by mouth 2 (two) times daily. Take at 6AM and 6PM   HYDROCODONE-ACETAMINOPHEN (NORCO/VICODIN) 5-325 MG TABLET    Take 1 tablet by mouth every 8 (eight) hours as needed for moderate pain. Give one dose 30 minutes prior to wound care   LAVENDER OIL OIL    S/O- INSOMINIA /AGITATION/ RESTLESSNESS/ ANXIETY- Apply a drop of lavendar oil or [place] Oil behind each ear per standing order. Repeat PRN. Discontinue use if skin irritation occurs.   LINEZOLID (ZYVOX) 600 MG TABLET     Take 600 mg by mouth 2 (two) times daily.   MIRTAZAPINE (REMERON) 15 MG TABLET    Take 15 mg by mouth daily. Take at 12 PM   MULTIPLE VITAMINS-MINERALS (DECUBI-VITE) CAPS    Take 1 capsule by mouth daily.   PROTEIN (PROCEL 100) POWD    Take 2 scoop by mouth 2 (two) times daily.   SACCHAROMYCES BOULARDII (FLORASTOR) 250 MG CAPSULE    Take 250 mg by mouth 2 (two) times daily. Take for one week   SENNA-DOCUSATE (SENNA S) 8.6-50 MG TABLET    Take 2 tablets by mouth 2 (two) times daily.   UNABLE TO FIND    Med Name: Med pass 90 mL 3 times daily  Modified Medications   No medications on file  Discontinued Medications   DEXTROSE-SODIUM CHLORIDE (DEXTROSE 5 % AND 0.45% NACL) INFUSION    Inject 80 mLs into the vein once. Give 1L at 80 mL/Hr via clysis   HYDROCODONE-ACETAMINOPHEN (NORCO/VICODIN) 5-325 MG TABLET    Take one tablet by mouth every 8 hours as needed for moderate pain. Do not exceed 4gm of Tylenol in 24 hours   NUTRITIONAL SUPPLEMENTS (NUTRITIONAL SHAKE PO)    Take by mouth 2 (two) times daily. Start Mighty Shake BID with MedPass (2PM, 8PM)     Allergies  Allergen Reactions  . Latex Rash     REVIEW OF SYSTEMS:  Unable to obtain due to advanced dementia   PHYSICAL EXAMINATION  GENERAL APPEARANCE:  In no acute distress. Obese SKIN:  Has gluteal cleft pressure ulcer stage 4 with dressing HEAD: Normal in size and contour. No evidence of trauma EYES: Lids open and close normally. No blepharitis, entropion or ectropion. PERRL. Conjunctivae are clear and sclerae are white. Lenses are without opacity EARS: Pinnae are normal. Patient hears normal voice of the examiner MOUTH and THROAT: Lips are without lesions. Oral mucosa is moist and without lesions. Tongue is normal in shape, size, and color and without lesions NECK: supple, trachea midline, no neck masses, no thyroid tenderness, no thyromegaly LYMPHATICS: no LAN in the neck, no supraclavicular LAN RESPIRATORY: breathing is even &  unlabored, BS CTAB CARDIAC: RRR, + murmur,no extra heart sounds, no edema GI: abdomen soft, normal BS, no masses, no tenderness, no hepatomegaly, no splenomegaly GU:  Has foley catheter attached to urine bag EXTREMITIES:  Able to move BUE, unable to move BLE PSYCHIATRIC: Alert to self, disoriented to time and place. Affect and behavior are  appropriate  LABS/RADIOLOGY: Labs reviewed: 03/09/16  Wbc 7.9  hgb 9.4  hct 35.2  MCV 81.7  NA 153  K 3.6  Glucose 94  BUN 16  Creatinine 0.60  Ca 9.5  GFR>60 Basic Metabolic Panel:  Recent Labs  13/03/6508/23/16 0428  10/19/15 0747 10/20/15 0648  12/19/15 1644 12/19/15 2010  12/30/15 03/02/16 03/05/16  NA 144  < > 141 142  < > 143  --   < > 142 146 151*  K 3.0*  < > 4.0 3.9  < > 3.4*  --   < > 3.6 3.9 3.5  CL 108  < > 113* 109  --  107  --   --   --   --   --   CO2 28  < > 22 26  --  26  --   --   --   --   --   GLUCOSE 138*  < > 182* 124*  --  154*  --   --   --   --   --   BUN 39*  < > 17 13  < > 13  --   < > 9 17 15   CREATININE 1.34*  < > 0.77 0.72  < > 0.79  --   < > 0.5 0.7 0.6  CALCIUM 9.7  < > 9.2 9.7  --  9.6  --   --   --   --   --   MG 1.8  --   --   --   --   --  1.9  --   --   --   --   PHOS  --   --   --   --   --   --  2.2*  --   --   --   --   < > = values in this interval not displayed. Liver Function Tests:  Recent Labs  10/14/15 1529 10/15/15 0649  12/19/15 1644 12/30/15 03/02/16 03/05/16  AST 65* 51*  < > 24 19 33 21  ALT 59* 48  < > 12* 11 15 12   ALKPHOS 89 72  < > 86 86 90 90  BILITOT 0.8 0.7  --  0.5  --   --   --   PROT 7.2 6.3*  --  7.0  --   --   --   ALBUMIN 2.7* 2.3*  --  1.9*  --   --   --   < > = values in this interval not displayed. CBC:  Recent Labs  10/20/15 0648  12/19/15 1644 12/20/15 1334  01/13/16 01/22/16 01/31/16 03/02/16 03/05/16  WBC 6.3  < > 11.1* 9.8  < > 6.1 7.2 9.4 14.3 14.8  NEUTROABS  --   < > 7.6  --   < > 3 4 6   --   --   HGB 10.9*  < > 7.5* 10.1*  < > 8.6* 8.5* 8.9* 9.2* 8.9*  HCT 35.1*   < > 26.1* 33.5*  < > 31* 30* 32* 31* 32*  MCV 78.5  --  70.7* 74.6*  --   --   --   --   --   --   PLT 144*  < > 430* 348  < > 289 270 301 420* 380  < > = values in this interval not displayed.  Cardiac Enzymes:  Recent Labs  05/17/15 1306  TROPONINI 0.08*   CBG:  Recent Labs  12/20/15 0552 12/20/15 1147 12/20/15 1707  GLUCAP 86 109* 80       ASSESSMENT/PLAN:  Hypernatremia - Na 153 ; start D5 1/2 NS @ 80 ml/hour X 2L; BMP after  Anemia, acute blood loss -   hemoglobin 9.4 ; FeSO4 was recently discontinued  MRSA Sacral Pressure Ulcer stage 4 - continue wound treatment, decubivite daily and Zyvox 600 mg Q 12 hours till 03/12/16; followed-up by hospice; will not have wound consult anymore; continue Norco 5/325 mg 1 tab by mouth BID and every 8 hours when necessary Tylenol 500 mg 1 tab by mouth every 6 hours for pain  Constipation - continue Dulcolax 10 mg suppository 1 per rectum every 3 days when necessary and senna S2 tabs by mouth twice a day  Protein-calorie malnutrition, severe -  continue Procel 2 scoops by mouth twice a day and med Pass 90 mL by mouth 3 times a day  Alzheimer's dementia - recently  discontinued Aricept  Hyperlipidemia - Lipitor is being tapered off  Diabetes mellitus, type II - diet controlled; hgbA1c 5.5  Depression - mood is stable; continue Remeron 15 mg by mouth daily      Goals of care:  Long-term care; hospice care    Kenard Gower, NP [facility] [phone]

## 2016-03-31 DEATH — deceased

## 2016-12-14 IMAGING — CR DG CHEST 2V
2 series · 2 of 2 positions shown · non-contrast
Comparison: 11/23/2013

CLINICAL DATA: Decreased PO intake over the past few days.
Hypertension.

EXAM:
CHEST  2 VIEW

[w chest lat]
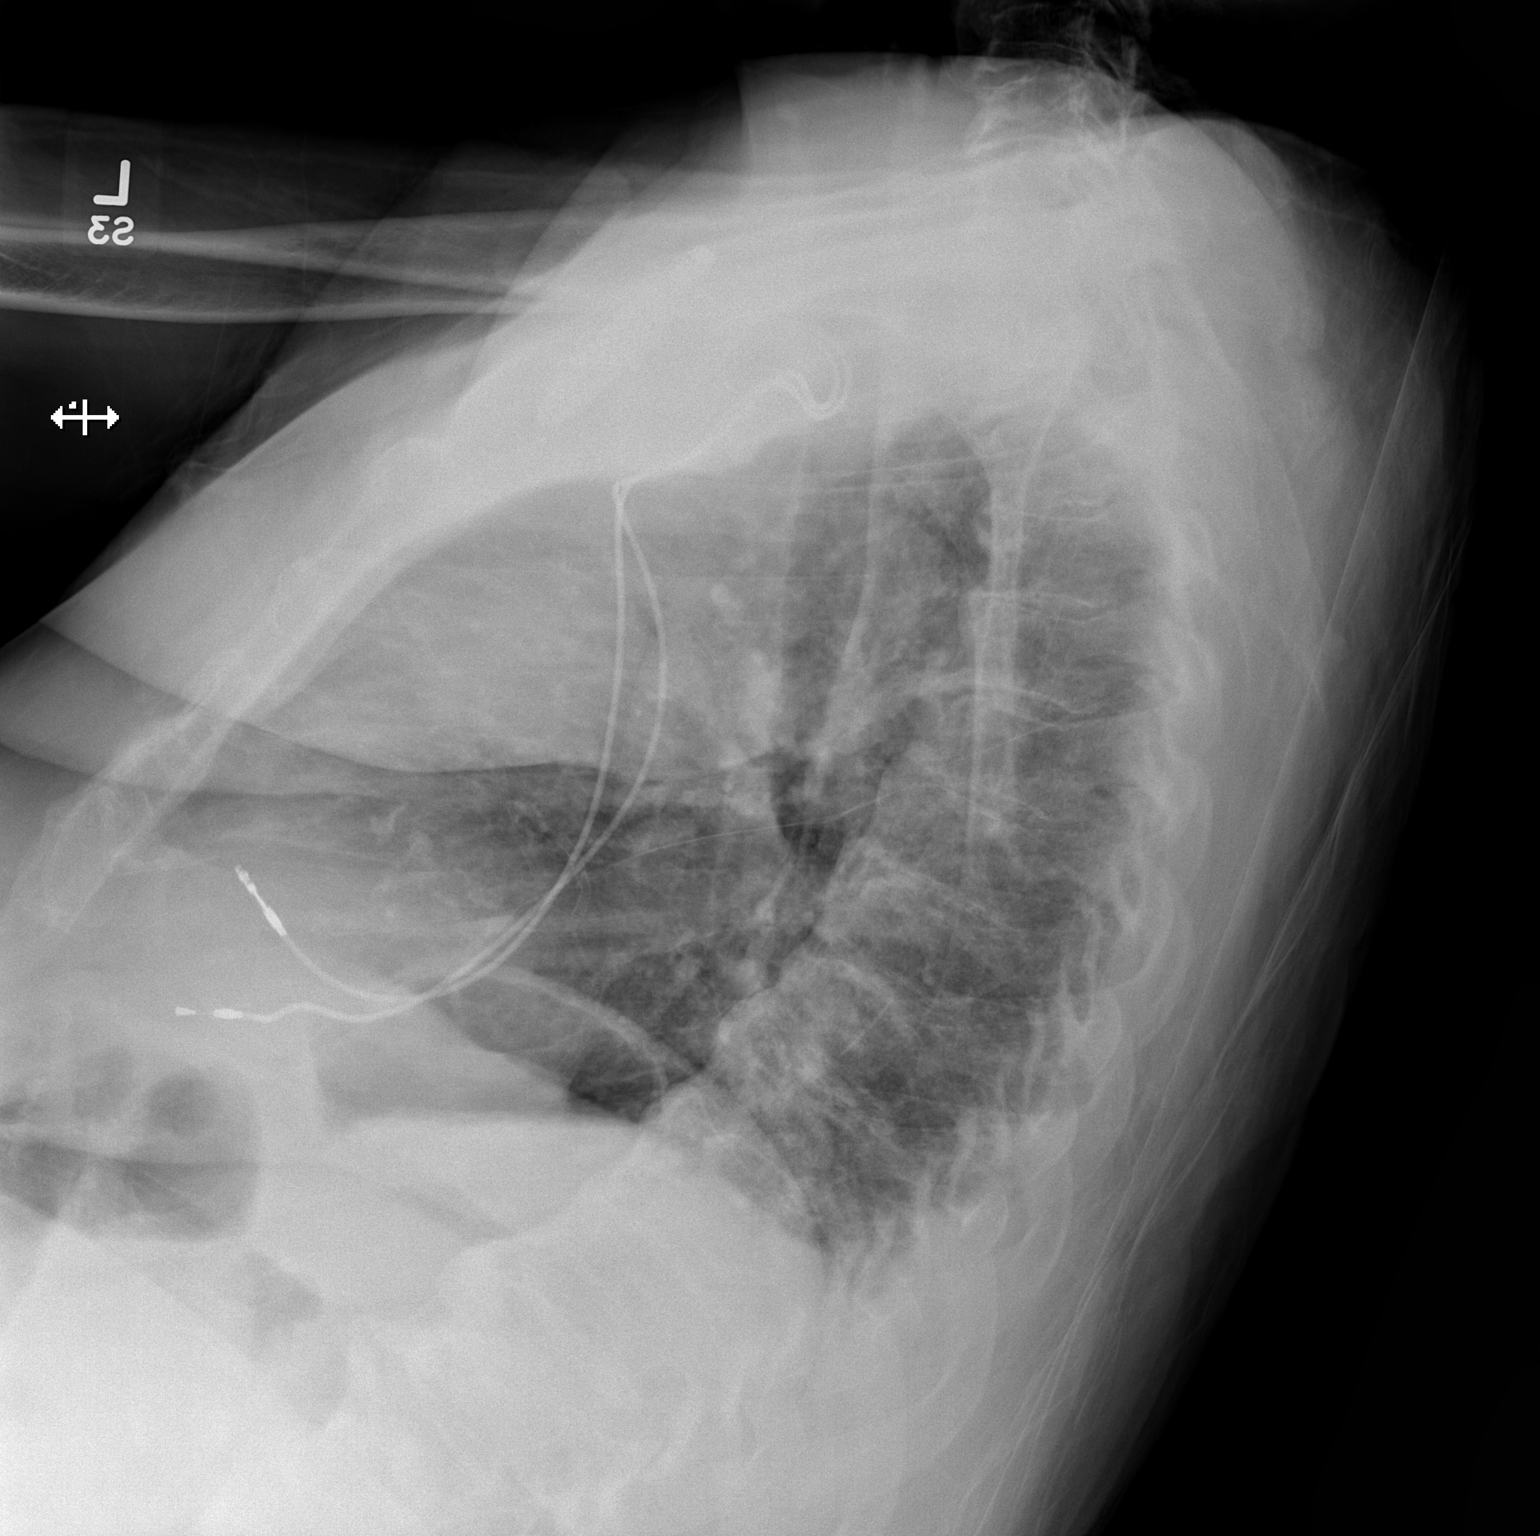

[x chest ap]
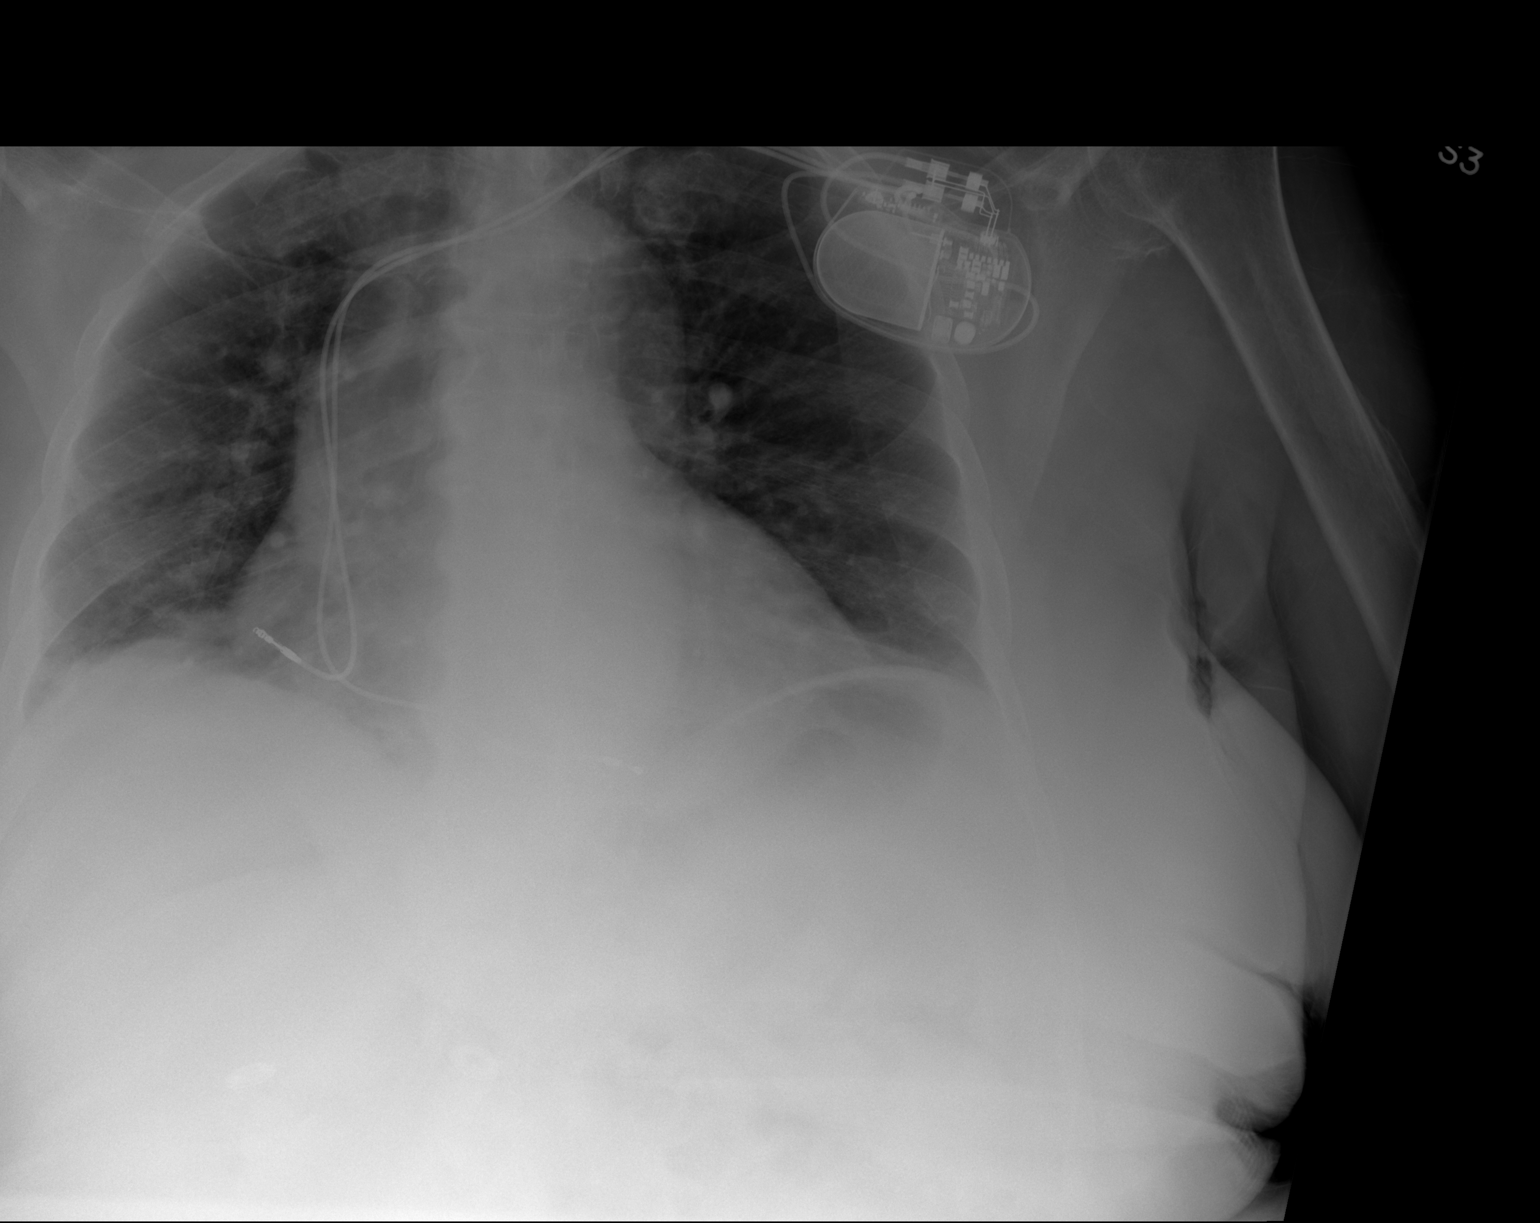

[2 of 2 positions shown; findings below may reference images not displayed]

FINDINGS: Left-sided pacemaker unchanged. Lungs are hypoinflated with subtle
right base opacification which may be due to atelectasis or
infection. Mild stable cardiomegaly. Remainder the exam is
unchanged.
IMPRESSION: Subtle right base opacification which may be due to atelectasis or
infection.

Stable mild cardiomegaly.

## 2017-05-13 IMAGING — CR DG CHEST 2V
2 series · 2 of 2 positions shown · non-contrast
Comparison: 06/07/2015

CLINICAL DATA: Weakness, dementia

EXAM:
CHEST  2 VIEW

[w chest lat]
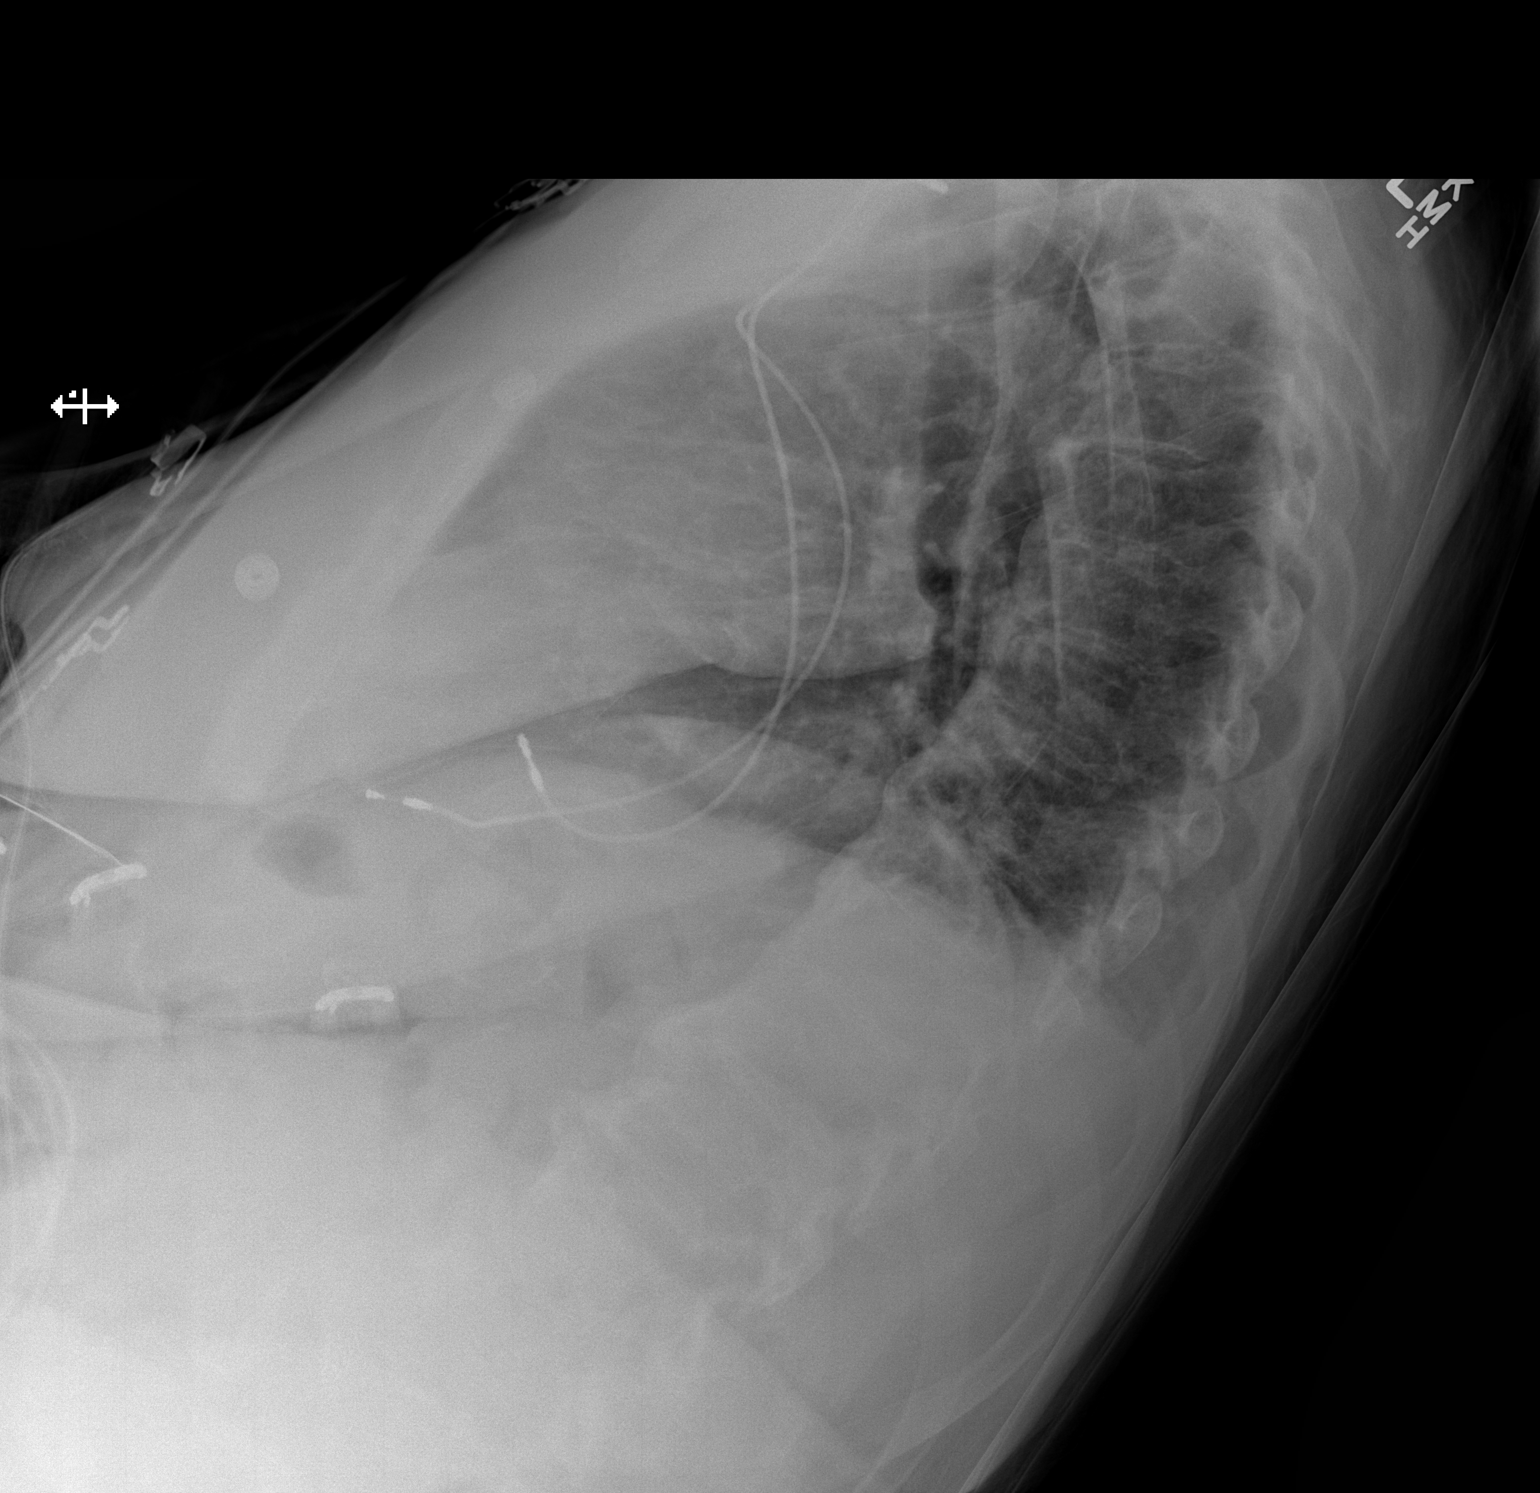

[x chest ap]
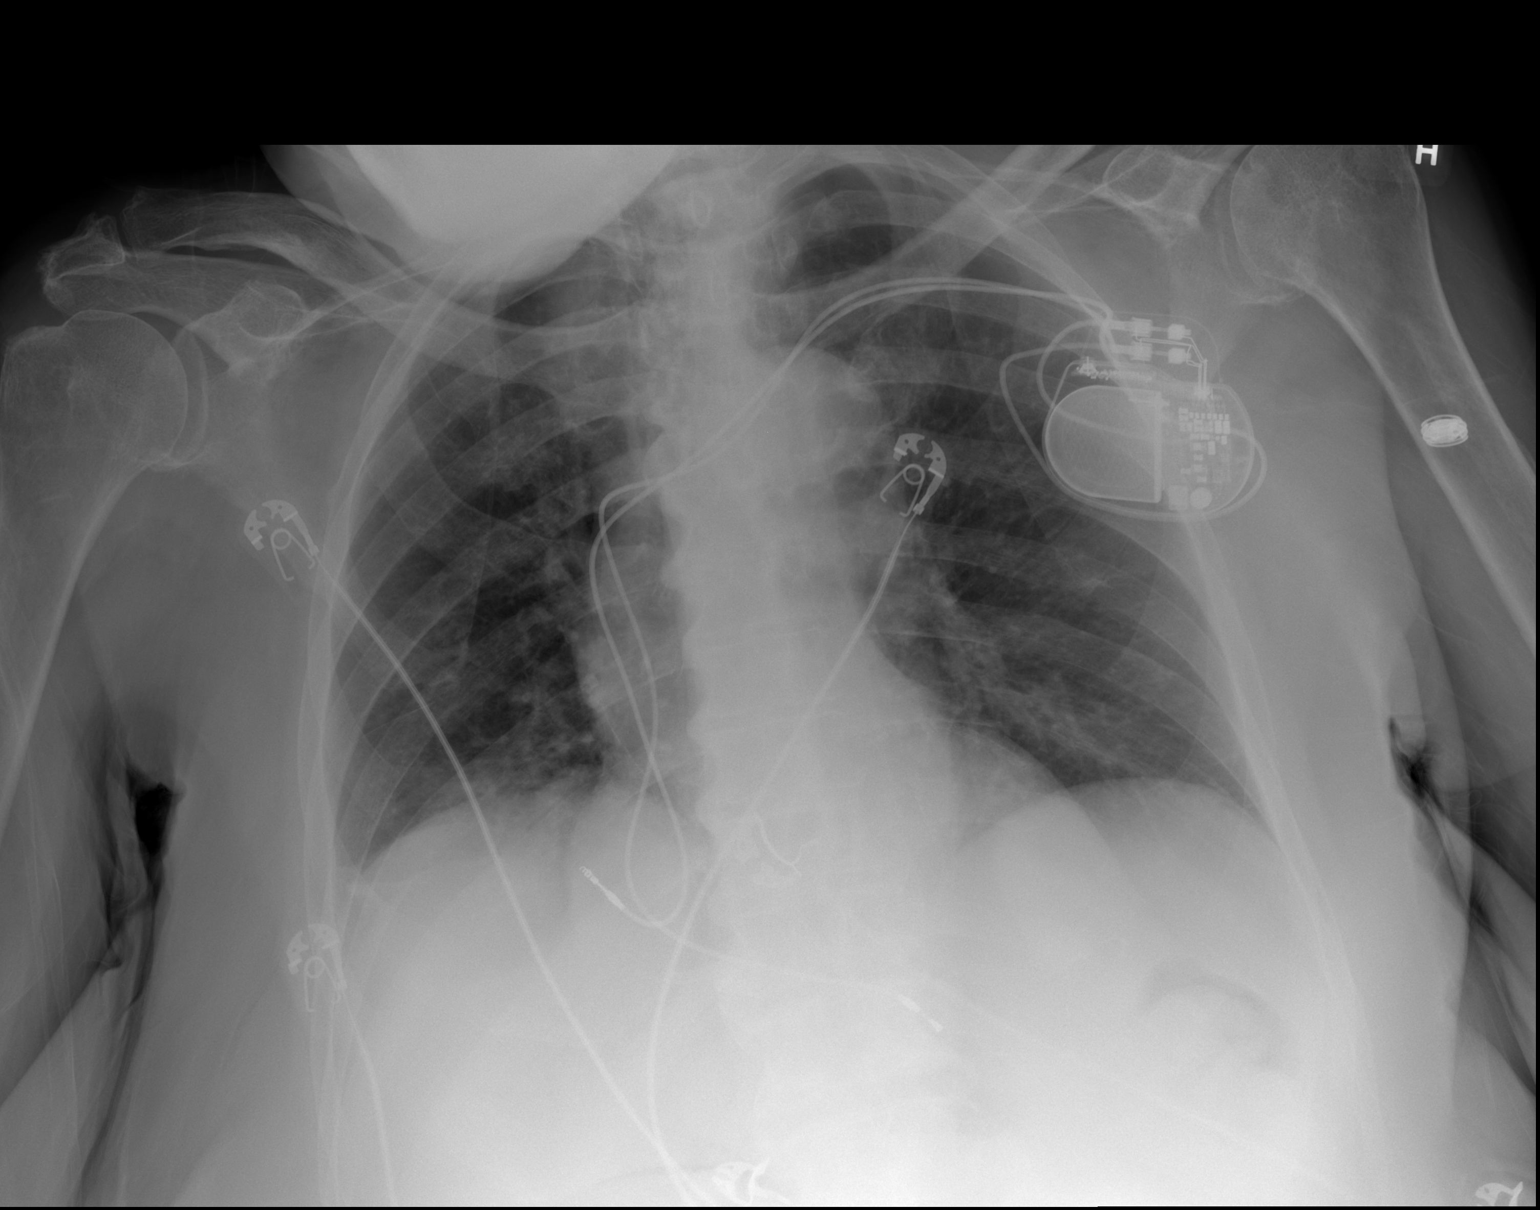

[2 of 2 positions shown; findings below may reference images not displayed]

FINDINGS: Mild right basilar opacity, likely atelectasis/ scarring. No focal
consolidation. No pleural effusion or pneumothorax.

Cardiomegaly.  Left subclavian pacemaker.

Degenerative changes of the visualized thoracolumbar spine.
IMPRESSION: Mild right basilar opacity, likely atelectasis/ scarring.

## 2017-07-12 IMAGING — XA IR FLUORO GUIDE CV LINE*R*
1 series · 1 of 1 positions shown · IV contrast (agent unspecified)
Comparison: none

INDICATION: Infection, decubitus ulcers, access for antibiotics

EXAM:
ULTRASOUND AND FLUOROSCOPIC GUIDED PICC LINE INSERTION
MEDICATIONS:
None.
CONTRAST:  None
FLUOROSCOPY TIME:  18 seconds (5 mGy)
COMPLICATIONS:
None immediate.
TECHNIQUE: The procedure, risks, benefits, and alternatives were explained to
the patient's family and informed written consent was obtained. A
timeout was performed prior to the initiation of the procedure.

[Series 300: ir fluoro guide cv midline picc right · 1 of 1 slices shown]
[im 1/1]
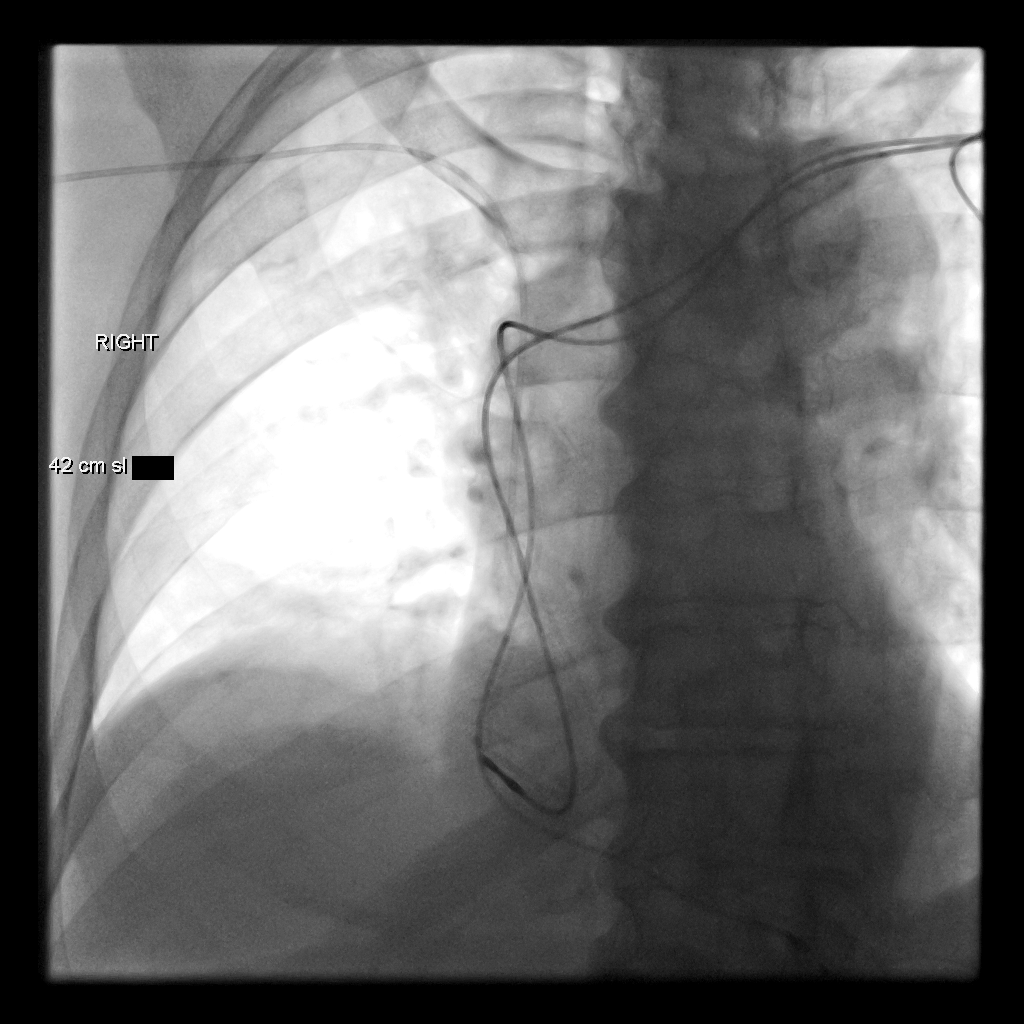

[1 of 1 positions shown; findings below may reference images not displayed]

The right upper extremity was prepped with chlorhexidine in a
sterile fashion, and a sterile drape was applied covering the
operative field. Maximum barrier sterile technique with sterile
gowns and gloves were used for the procedure. A timeout was
performed prior to the initiation of the procedure. Local anesthesia
was provided with 1% lidocaine.

Under direct ultrasound guidance, the patent right basilic vein was
accessed with a micropuncture kit after the overlying soft tissues
were anesthetized with 1% lidocaine. An ultrasound image was saved
for documentation purposes. A guidewire was advanced to the level of
the superior caval-atrial junction for measurement purposes and the
PICC line was cut to length. A peel-away sheath was placed and a 42
cm, 5 French, single lumen was inserted to level of the superior
caval-atrial junction. A post procedure spot fluoroscopic was
obtained. The catheter easily aspirated and flushed and was sutured
in place. A dressing was placed. The patient tolerated the procedure
well without immediate post procedural complication.
FINDINGS: After catheter placement, the tip lies within the superior
cavoatrial junction. The catheter aspirates and flushes normally and
is ready for immediate use.
IMPRESSION: Successful ultrasound and fluoroscopic guided placement of a right
basilic vein approach, 42 cm, 5 French, single lumen PICC with tip
at the superior caval-atrial junction. The PICC line is ready for
immediate use.
